# Patient Record
Sex: Male | Born: 1945 | Race: White | Hispanic: No | Marital: Married | Smoking: Former smoker
Health system: Southern US, Community
[De-identification: ages and names within clinical notes are randomized; demographics above are authoritative.]

## PROBLEM LIST (undated history)

## (undated) DIAGNOSIS — I499 Cardiac arrhythmia, unspecified: Secondary | ICD-10-CM

## (undated) DIAGNOSIS — T4145XA Adverse effect of unspecified anesthetic, initial encounter: Secondary | ICD-10-CM

## (undated) DIAGNOSIS — C189 Malignant neoplasm of colon, unspecified: Secondary | ICD-10-CM

## (undated) DIAGNOSIS — K430 Incisional hernia with obstruction, without gangrene: Secondary | ICD-10-CM

## (undated) DIAGNOSIS — I10 Essential (primary) hypertension: Secondary | ICD-10-CM

## (undated) DIAGNOSIS — T8859XA Other complications of anesthesia, initial encounter: Secondary | ICD-10-CM

## (undated) DIAGNOSIS — K469 Unspecified abdominal hernia without obstruction or gangrene: Secondary | ICD-10-CM

## (undated) DIAGNOSIS — J449 Chronic obstructive pulmonary disease, unspecified: Secondary | ICD-10-CM

## (undated) DIAGNOSIS — K449 Diaphragmatic hernia without obstruction or gangrene: Secondary | ICD-10-CM

## (undated) DIAGNOSIS — K219 Gastro-esophageal reflux disease without esophagitis: Secondary | ICD-10-CM

## (undated) HISTORY — DX: Unspecified abdominal hernia without obstruction or gangrene: K46.9

## (undated) HISTORY — DX: Chronic obstructive pulmonary disease, unspecified: J44.9

## (undated) HISTORY — DX: Essential (primary) hypertension: I10

## (undated) HISTORY — PX: HAND SURGERY: SHX662

---

## 1999-10-05 ENCOUNTER — Encounter: Payer: Self-pay | Admitting: Endocrinology

## 1999-10-05 ENCOUNTER — Inpatient Hospital Stay (HOSPITAL_COMMUNITY): Admission: EM | Admit: 1999-10-05 | Discharge: 1999-10-10 | Payer: Self-pay | Admitting: Emergency Medicine

## 1999-10-05 ENCOUNTER — Encounter: Payer: Self-pay | Admitting: Emergency Medicine

## 1999-10-06 ENCOUNTER — Encounter: Payer: Self-pay | Admitting: Internal Medicine

## 1999-10-07 ENCOUNTER — Encounter: Payer: Self-pay | Admitting: Internal Medicine

## 1999-10-08 ENCOUNTER — Encounter: Payer: Self-pay | Admitting: Internal Medicine

## 1999-10-09 ENCOUNTER — Encounter: Payer: Self-pay | Admitting: Internal Medicine

## 2001-12-05 ENCOUNTER — Emergency Department (HOSPITAL_COMMUNITY): Admission: EM | Admit: 2001-12-05 | Discharge: 2001-12-06 | Payer: Self-pay | Admitting: Emergency Medicine

## 2002-06-15 ENCOUNTER — Ambulatory Visit (HOSPITAL_COMMUNITY): Admission: RE | Admit: 2002-06-15 | Discharge: 2002-06-15 | Payer: Self-pay | Admitting: Internal Medicine

## 2003-05-30 ENCOUNTER — Emergency Department (HOSPITAL_COMMUNITY): Admission: EM | Admit: 2003-05-30 | Discharge: 2003-05-30 | Payer: Self-pay | Admitting: Emergency Medicine

## 2003-11-06 ENCOUNTER — Ambulatory Visit: Admission: RE | Admit: 2003-11-06 | Discharge: 2003-11-06 | Payer: Self-pay | Admitting: Internal Medicine

## 2004-05-15 ENCOUNTER — Emergency Department (HOSPITAL_COMMUNITY): Admission: EM | Admit: 2004-05-15 | Discharge: 2004-05-15 | Payer: Self-pay | Admitting: Emergency Medicine

## 2005-02-11 ENCOUNTER — Inpatient Hospital Stay (HOSPITAL_COMMUNITY): Admission: AD | Admit: 2005-02-11 | Discharge: 2005-02-14 | Payer: Self-pay | Admitting: Psychiatry

## 2005-02-12 ENCOUNTER — Ambulatory Visit: Payer: Self-pay | Admitting: Psychiatry

## 2010-03-06 ENCOUNTER — Encounter: Admission: RE | Admit: 2010-03-06 | Discharge: 2010-03-06 | Payer: Self-pay | Admitting: General Surgery

## 2010-03-06 DIAGNOSIS — I499 Cardiac arrhythmia, unspecified: Secondary | ICD-10-CM

## 2010-03-06 HISTORY — DX: Cardiac arrhythmia, unspecified: I49.9

## 2010-03-20 ENCOUNTER — Encounter (INDEPENDENT_AMBULATORY_CARE_PROVIDER_SITE_OTHER): Payer: Self-pay | Admitting: General Surgery

## 2010-03-20 ENCOUNTER — Inpatient Hospital Stay (HOSPITAL_COMMUNITY): Admission: RE | Admit: 2010-03-20 | Discharge: 2010-03-26 | Payer: Self-pay | Admitting: General Surgery

## 2010-03-26 ENCOUNTER — Ambulatory Visit: Payer: Self-pay | Admitting: Oncology

## 2010-03-28 ENCOUNTER — Inpatient Hospital Stay (HOSPITAL_COMMUNITY): Admission: EM | Admit: 2010-03-28 | Discharge: 2010-04-04 | Payer: Self-pay | Admitting: Emergency Medicine

## 2010-03-28 ENCOUNTER — Encounter: Admission: RE | Admit: 2010-03-28 | Discharge: 2010-03-28 | Payer: Self-pay | Admitting: General Surgery

## 2010-03-29 ENCOUNTER — Encounter (INDEPENDENT_AMBULATORY_CARE_PROVIDER_SITE_OTHER): Payer: Self-pay | Admitting: Emergency Medicine

## 2010-04-17 ENCOUNTER — Inpatient Hospital Stay (HOSPITAL_COMMUNITY): Admission: EM | Admit: 2010-04-17 | Discharge: 2010-04-19 | Payer: Self-pay | Admitting: Emergency Medicine

## 2010-04-22 ENCOUNTER — Ambulatory Visit (HOSPITAL_COMMUNITY): Admission: RE | Admit: 2010-04-22 | Discharge: 2010-04-22 | Payer: Self-pay | Admitting: General Surgery

## 2010-05-06 ENCOUNTER — Ambulatory Visit: Payer: Self-pay | Admitting: Oncology

## 2010-05-22 LAB — COMPREHENSIVE METABOLIC PANEL
Albumin: 3 g/dL — ABNORMAL LOW (ref 3.5–5.2)
BUN: 19 mg/dL (ref 6–23)
Creatinine, Ser: 0.8 mg/dL (ref 0.40–1.50)
Glucose, Bld: 130 mg/dL — ABNORMAL HIGH (ref 70–99)
Total Bilirubin: 0.5 mg/dL (ref 0.3–1.2)
Total Protein: 6 g/dL (ref 6.0–8.3)

## 2010-05-22 LAB — CBC WITH DIFFERENTIAL/PLATELET
Eosinophils Absolute: 0.1 10*3/uL (ref 0.0–0.5)
HCT: 38.7 % (ref 38.4–49.9)
MCH: 29 pg (ref 27.2–33.4)
MCV: 87 fL (ref 79.3–98.0)
MONO#: 0.8 10*3/uL (ref 0.1–0.9)
MONO%: 12.8 % (ref 0.0–14.0)
NEUT#: 3.4 10*3/uL (ref 1.5–6.5)
RBC: 4.45 10*6/uL (ref 4.20–5.82)
RDW: 14.9 % — ABNORMAL HIGH (ref 11.0–14.6)
lymph#: 1.9 10*3/uL (ref 0.9–3.3)

## 2010-06-05 ENCOUNTER — Ambulatory Visit: Payer: Self-pay | Admitting: Oncology

## 2010-06-05 LAB — COMPREHENSIVE METABOLIC PANEL
ALT: 35 U/L (ref 0–53)
AST: 30 U/L (ref 0–37)
Chloride: 109 mEq/L (ref 96–112)
Creatinine, Ser: 0.77 mg/dL (ref 0.40–1.50)
Glucose, Bld: 164 mg/dL — ABNORMAL HIGH (ref 70–99)

## 2010-06-05 LAB — CBC WITH DIFFERENTIAL/PLATELET
Basophils Absolute: 0 10*3/uL (ref 0.0–0.1)
HCT: 37.8 % — ABNORMAL LOW (ref 38.4–49.9)
MCH: 28.8 pg (ref 27.2–33.4)
MCV: 87.9 fL (ref 79.3–98.0)
MONO#: 0.5 10*3/uL (ref 0.1–0.9)
NEUT#: 3.7 10*3/uL (ref 1.5–6.5)
Platelets: 177 10*3/uL (ref 140–400)
RBC: 4.3 10*6/uL (ref 4.20–5.82)
RDW: 17 % — ABNORMAL HIGH (ref 11.0–14.6)
WBC: 6 10*3/uL (ref 4.0–10.3)
lymph#: 1.7 10*3/uL (ref 0.9–3.3)
nRBC: 0 % (ref 0–0)

## 2010-07-02 LAB — CBC WITH DIFFERENTIAL/PLATELET
BASO%: 0.8 % (ref 0.0–2.0)
EOS%: 0.9 % (ref 0.0–7.0)
HCT: 40.7 % (ref 38.4–49.9)
HGB: 13.3 g/dL (ref 13.0–17.1)
MCHC: 32.7 g/dL (ref 32.0–36.0)
MONO%: 11.3 % (ref 0.0–14.0)
NEUT#: 5.5 10*3/uL (ref 1.5–6.5)
Platelets: 167 10*3/uL (ref 140–400)
RDW: 20.3 % — ABNORMAL HIGH (ref 11.0–14.6)
lymph#: 2 10*3/uL (ref 0.9–3.3)

## 2010-07-02 LAB — COMPREHENSIVE METABOLIC PANEL
BUN: 16 mg/dL (ref 6–23)
CO2: 30 mEq/L (ref 19–32)
Chloride: 111 mEq/L (ref 96–112)
Creatinine, Ser: 0.7 mg/dL (ref 0.40–1.50)
Potassium: 3.4 mEq/L — ABNORMAL LOW (ref 3.5–5.3)
Sodium: 142 mEq/L (ref 135–145)
Total Bilirubin: 0.8 mg/dL (ref 0.3–1.2)

## 2010-07-02 LAB — CEA: CEA: 2.6 ng/mL (ref 0.0–5.0)

## 2010-07-10 ENCOUNTER — Ambulatory Visit: Payer: Self-pay | Admitting: Oncology

## 2010-07-30 LAB — COMPREHENSIVE METABOLIC PANEL
ALT: 25 U/L (ref 0–53)
Alkaline Phosphatase: 78 U/L (ref 39–117)
CO2: 30 mEq/L (ref 19–32)
Calcium: 9.3 mg/dL (ref 8.4–10.5)
Chloride: 104 mEq/L (ref 96–112)
Potassium: 3.2 mEq/L — ABNORMAL LOW (ref 3.5–5.3)

## 2010-07-30 LAB — CBC WITH DIFFERENTIAL/PLATELET
EOS%: 1.1 % (ref 0.0–7.0)
HCT: 42.7 % (ref 38.4–49.9)
HGB: 14.4 g/dL (ref 13.0–17.1)
LYMPH%: 17.2 % (ref 14.0–49.0)
MCH: 32 pg (ref 27.2–33.4)
MCHC: 33.8 g/dL (ref 32.0–36.0)
NEUT#: 7.6 10*3/uL — ABNORMAL HIGH (ref 1.5–6.5)
Platelets: 227 10*3/uL (ref 140–400)
RDW: 20.7 % — ABNORMAL HIGH (ref 11.0–14.6)
lymph#: 1.8 10*3/uL (ref 0.9–3.3)

## 2010-08-14 ENCOUNTER — Other Ambulatory Visit: Payer: Self-pay | Admitting: Oncology

## 2010-08-14 LAB — CBC WITH DIFFERENTIAL/PLATELET
BASO%: 0.3 % (ref 0.0–2.0)
EOS%: 1.1 % (ref 0.0–7.0)
HCT: 38.2 % — ABNORMAL LOW (ref 38.4–49.9)
LYMPH%: 23.1 % (ref 14.0–49.0)
MONO%: 12.1 % (ref 0.0–14.0)
NEUT%: 63.4 % (ref 39.0–75.0)
RBC: 3.97 10*6/uL — ABNORMAL LOW (ref 4.20–5.82)
WBC: 4.5 10*3/uL (ref 4.0–10.3)

## 2010-08-14 LAB — COMPREHENSIVE METABOLIC PANEL
ALT: 27 U/L (ref 0–53)
BUN: 18 mg/dL (ref 6–23)
CO2: 29 mEq/L (ref 19–32)
Calcium: 9.2 mg/dL (ref 8.4–10.5)
Chloride: 104 mEq/L (ref 96–112)
Creatinine, Ser: 0.92 mg/dL (ref 0.40–1.50)
Glucose, Bld: 174 mg/dL — ABNORMAL HIGH (ref 70–99)

## 2010-09-03 ENCOUNTER — Other Ambulatory Visit: Payer: Self-pay | Admitting: Oncology

## 2010-09-03 LAB — CBC WITH DIFFERENTIAL/PLATELET
BASO%: 1.3 % (ref 0.0–2.0)
EOS%: 1.8 % (ref 0.0–7.0)
LYMPH%: 31.2 % (ref 14.0–49.0)
MCHC: 34 g/dL (ref 32.0–36.0)
MCV: 95.6 fL (ref 79.3–98.0)
MONO#: 0.7 10*3/uL (ref 0.1–0.9)
MONO%: 15.1 % — ABNORMAL HIGH (ref 0.0–14.0)
Platelets: 208 10*3/uL (ref 140–400)
RBC: 4.52 10*6/uL (ref 4.20–5.82)
WBC: 4.5 10*3/uL (ref 4.0–10.3)
nRBC: 0 % (ref 0–0)

## 2010-09-03 LAB — COMPREHENSIVE METABOLIC PANEL
ALT: 32 U/L (ref 0–53)
CO2: 27 mEq/L (ref 19–32)
Sodium: 140 mEq/L (ref 135–145)
Total Bilirubin: 0.6 mg/dL (ref 0.3–1.2)
Total Protein: 6.5 g/dL (ref 6.0–8.3)

## 2010-09-13 ENCOUNTER — Ambulatory Visit: Payer: Self-pay | Admitting: Oncology

## 2010-09-23 ENCOUNTER — Ambulatory Visit (HOSPITAL_COMMUNITY): Admission: RE | Admit: 2010-09-23 | Payer: Self-pay | Source: Home / Self Care | Admitting: General Surgery

## 2010-10-25 ENCOUNTER — Ambulatory Visit: Payer: Self-pay | Admitting: Oncology

## 2010-10-29 LAB — CBC WITH DIFFERENTIAL/PLATELET
Basophils Absolute: 0 10*3/uL (ref 0.0–0.1)
HCT: 43.6 % (ref 38.4–49.9)
HGB: 14.9 g/dL (ref 13.0–17.1)
MCH: 32.9 pg (ref 27.2–33.4)
MCV: 96.3 fL (ref 79.3–98.0)
MONO#: 0.7 10*3/uL (ref 0.1–0.9)
NEUT#: 6.2 10*3/uL (ref 1.5–6.5)
NEUT%: 73 % (ref 39.0–75.0)
Platelets: 205 10*3/uL (ref 140–400)
RBC: 4.52 10*6/uL (ref 4.20–5.82)
RDW: 14.4 % (ref 11.0–14.6)
WBC: 8.5 10*3/uL (ref 4.0–10.3)

## 2010-10-29 LAB — COMPREHENSIVE METABOLIC PANEL
ALT: 23 U/L (ref 0–53)
Albumin: 4.2 g/dL (ref 3.5–5.2)
Calcium: 9.1 mg/dL (ref 8.4–10.5)
Creatinine, Ser: 0.9 mg/dL (ref 0.40–1.50)
Sodium: 140 mEq/L (ref 135–145)

## 2010-11-14 ENCOUNTER — Other Ambulatory Visit: Payer: Self-pay | Admitting: Oncology

## 2010-11-14 ENCOUNTER — Encounter (HOSPITAL_BASED_OUTPATIENT_CLINIC_OR_DEPARTMENT_OTHER): Payer: MEDICARE | Admitting: Oncology

## 2010-11-14 DIAGNOSIS — J4489 Other specified chronic obstructive pulmonary disease: Secondary | ICD-10-CM

## 2010-11-14 DIAGNOSIS — J449 Chronic obstructive pulmonary disease, unspecified: Secondary | ICD-10-CM

## 2010-11-14 DIAGNOSIS — Z5111 Encounter for antineoplastic chemotherapy: Secondary | ICD-10-CM

## 2010-11-14 DIAGNOSIS — G620 Drug-induced polyneuropathy: Secondary | ICD-10-CM

## 2010-11-14 DIAGNOSIS — C180 Malignant neoplasm of cecum: Secondary | ICD-10-CM

## 2010-11-14 LAB — COMPREHENSIVE METABOLIC PANEL
ALT: 24 U/L (ref 0–53)
Albumin: 4.3 g/dL (ref 3.5–5.2)
BUN: 19 mg/dL (ref 6–23)
CO2: 24 mEq/L (ref 19–32)
Calcium: 9.2 mg/dL (ref 8.4–10.5)
Creatinine, Ser: 0.76 mg/dL (ref 0.40–1.50)
Potassium: 3.5 mEq/L (ref 3.5–5.3)
Sodium: 144 mEq/L (ref 135–145)

## 2010-11-14 LAB — CBC WITH DIFFERENTIAL/PLATELET
MCHC: 35.5 g/dL (ref 32.0–36.0)
MONO#: 0.6 10*3/uL (ref 0.1–0.9)
MONO%: 8.4 % (ref 0.0–14.0)
NEUT#: 4.5 10*3/uL (ref 1.5–6.5)
NEUT%: 65.6 % (ref 39.0–75.0)
Platelets: 168 10*3/uL (ref 140–400)
RBC: 4.41 10*6/uL (ref 4.20–5.82)

## 2010-11-16 ENCOUNTER — Encounter (HOSPITAL_BASED_OUTPATIENT_CLINIC_OR_DEPARTMENT_OTHER): Payer: MEDICARE | Admitting: Oncology

## 2010-11-16 DIAGNOSIS — C180 Malignant neoplasm of cecum: Secondary | ICD-10-CM

## 2010-11-28 ENCOUNTER — Other Ambulatory Visit: Payer: Self-pay | Admitting: Oncology

## 2010-11-28 ENCOUNTER — Encounter (HOSPITAL_BASED_OUTPATIENT_CLINIC_OR_DEPARTMENT_OTHER): Payer: MEDICARE | Admitting: Oncology

## 2010-11-28 DIAGNOSIS — C180 Malignant neoplasm of cecum: Secondary | ICD-10-CM

## 2010-11-28 DIAGNOSIS — I10 Essential (primary) hypertension: Secondary | ICD-10-CM

## 2010-11-28 DIAGNOSIS — G620 Drug-induced polyneuropathy: Secondary | ICD-10-CM

## 2010-11-28 DIAGNOSIS — Z5111 Encounter for antineoplastic chemotherapy: Secondary | ICD-10-CM

## 2010-11-28 LAB — CBC WITH DIFFERENTIAL/PLATELET
Basophils Absolute: 0 10*3/uL (ref 0.0–0.1)
HCT: 43.8 % (ref 38.4–49.9)
HGB: 15.1 g/dL (ref 13.0–17.1)
LYMPH%: 19.7 % (ref 14.0–49.0)
MCH: 33.3 pg (ref 27.2–33.4)
MCHC: 34.6 g/dL (ref 32.0–36.0)
NEUT%: 69.2 % (ref 39.0–75.0)
Platelets: 195 10*3/uL (ref 140–400)
RBC: 4.54 10*6/uL (ref 4.20–5.82)
WBC: 7.7 10*3/uL (ref 4.0–10.3)
lymph#: 1.5 10*3/uL (ref 0.9–3.3)

## 2010-11-28 LAB — COMPREHENSIVE METABOLIC PANEL
AST: 27 U/L (ref 0–37)
Alkaline Phosphatase: 77 U/L (ref 39–117)
BUN: 21 mg/dL (ref 6–23)
Creatinine, Ser: 1.05 mg/dL (ref 0.40–1.50)
Glucose, Bld: 142 mg/dL — ABNORMAL HIGH (ref 70–99)
Potassium: 3.5 mEq/L (ref 3.5–5.3)
Sodium: 137 mEq/L (ref 135–145)
Total Bilirubin: 1 mg/dL (ref 0.3–1.2)

## 2010-11-30 ENCOUNTER — Encounter (HOSPITAL_BASED_OUTPATIENT_CLINIC_OR_DEPARTMENT_OTHER): Payer: MEDICARE | Admitting: Oncology

## 2010-11-30 DIAGNOSIS — C180 Malignant neoplasm of cecum: Secondary | ICD-10-CM

## 2010-12-21 LAB — CBC
Hemoglobin: 12.8 g/dL — ABNORMAL LOW (ref 13.0–17.0)
MCH: 30.2 pg (ref 26.0–34.0)
MCV: 88.9 fL (ref 78.0–100.0)
RBC: 4.23 MIL/uL (ref 4.22–5.81)

## 2010-12-21 LAB — BASIC METABOLIC PANEL
CO2: 28 mEq/L (ref 19–32)
Chloride: 105 mEq/L (ref 96–112)
GFR calc Af Amer: >60 mL/min (ref >60)
Sodium: 141 mEq/L (ref 135–145)

## 2010-12-21 LAB — APTT: aPTT: 27 seconds (ref 24–37)

## 2010-12-21 LAB — PROTIME-INR: INR: 1.05 (ref 0.00–1.49)

## 2010-12-22 LAB — URINE CULTURE: Colony Count: 2000

## 2010-12-22 LAB — DIFFERENTIAL
Basophils Relative: 0 % (ref 0–1)
Eosinophils Absolute: 0.1 10*3/uL (ref 0.0–0.7)
Lymphs Abs: 1.2 10*3/uL (ref 0.7–4.0)
Monocytes Absolute: 0.6 10*3/uL (ref 0.1–1.0)
Monocytes Relative: 7 % (ref 3–12)

## 2010-12-22 LAB — TYPE AND SCREEN
ABO/RH(D): O POS
Antibody Screen: NEGATIVE

## 2010-12-22 LAB — CBC
HCT: 39.7 % (ref 39.0–52.0)
MCH: 29.8 pg (ref 26.0–34.0)
MCHC: 33.2 g/dL (ref 30.0–36.0)
MCHC: 33.3 g/dL (ref 30.0–36.0)
MCV: 89.6 fL (ref 78.0–100.0)
Platelets: 276 10*3/uL (ref 150–400)
Platelets: 325 10*3/uL (ref 150–400)
RDW: 13.4 % (ref 11.5–15.5)
RDW: 13.8 % (ref 11.5–15.5)
WBC: 9.4 10*3/uL (ref 4.0–10.5)

## 2010-12-22 LAB — BASIC METABOLIC PANEL
BUN: 11 mg/dL (ref 6–23)
CO2: 27 mEq/L (ref 19–32)
Calcium: 8.8 mg/dL (ref 8.4–10.5)
Chloride: 105 mEq/L (ref 96–112)
Creatinine, Ser: 0.86 mg/dL (ref 0.4–1.5)

## 2010-12-22 LAB — ABO/RH: ABO/RH(D): O POS

## 2010-12-22 LAB — URINALYSIS, ROUTINE W REFLEX MICROSCOPIC
Glucose, UA: NEGATIVE mg/dL
Hgb urine dipstick: NEGATIVE
Ketones, ur: 15 mg/dL — AB
Leukocytes, UA: NEGATIVE
Protein, ur: 100 mg/dL — AB
pH: 8 (ref 5.0–8.0)

## 2010-12-22 LAB — COMPREHENSIVE METABOLIC PANEL
ALT: 18 U/L (ref 0–53)
AST: 14 U/L (ref 0–37)
Albumin: 3.4 g/dL — ABNORMAL LOW (ref 3.5–5.2)
Alkaline Phosphatase: 90 U/L (ref 39–117)
Calcium: 9.8 mg/dL (ref 8.4–10.5)
GFR calc Af Amer: >60 mL/min (ref >60)
Glucose, Bld: 137 mg/dL — ABNORMAL HIGH (ref 70–99)
Potassium: 3.9 mEq/L (ref 3.5–5.1)
Sodium: 141 mEq/L (ref 135–145)
Total Protein: 6.9 g/dL (ref 6.0–8.3)

## 2010-12-22 LAB — CLOSTRIDIUM DIFFICILE EIA

## 2010-12-22 LAB — URINE MICROSCOPIC-ADD ON

## 2010-12-22 LAB — MAGNESIUM: Magnesium: 2 mg/dL (ref 1.5–2.5)

## 2010-12-23 LAB — CBC
HCT: 32.3 % — ABNORMAL LOW (ref 39.0–52.0)
HCT: 32.8 % — ABNORMAL LOW (ref 39.0–52.0)
HCT: 32.9 % — ABNORMAL LOW (ref 39.0–52.0)
HCT: 33.8 % — ABNORMAL LOW (ref 39.0–52.0)
HCT: 35.3 % — ABNORMAL LOW (ref 39.0–52.0)
HCT: 46.2 % (ref 39.0–52.0)
Hemoglobin: 11.1 g/dL — ABNORMAL LOW (ref 13.0–17.0)
Hemoglobin: 11.2 g/dL — ABNORMAL LOW (ref 13.0–17.0)
Hemoglobin: 11.3 g/dL — ABNORMAL LOW (ref 13.0–17.0)
Hemoglobin: 11.6 g/dL — ABNORMAL LOW (ref 13.0–17.0)
Hemoglobin: 11.8 g/dL — ABNORMAL LOW (ref 13.0–17.0)
Hemoglobin: 11.9 g/dL — ABNORMAL LOW (ref 13.0–17.0)
Hemoglobin: 11.9 g/dL — ABNORMAL LOW (ref 13.0–17.0)
Hemoglobin: 12.2 g/dL — ABNORMAL LOW (ref 13.0–17.0)
Hemoglobin: 15.5 g/dL (ref 13.0–17.0)
MCH: 30.7 pg (ref 26.0–34.0)
MCH: 31.2 pg (ref 26.0–34.0)
MCHC: 33.5 g/dL (ref 30.0–36.0)
MCHC: 33.5 g/dL (ref 30.0–36.0)
MCHC: 34 g/dL (ref 30.0–36.0)
MCHC: 34 g/dL (ref 30.0–36.0)
MCHC: 34.3 g/dL (ref 30.0–36.0)
MCV: 90.4 fL (ref 78.0–100.0)
MCV: 90.6 fL (ref 78.0–100.0)
MCV: 91.6 fL (ref 78.0–100.0)
MCV: 92.1 fL (ref 78.0–100.0)
MCV: 92.1 fL (ref 78.0–100.0)
Platelets: 218 10*3/uL (ref 150–400)
Platelets: 275 10*3/uL (ref 150–400)
Platelets: 315 10*3/uL (ref 150–400)
Platelets: 321 10*3/uL (ref 150–400)
RBC: 3.66 MIL/uL — ABNORMAL LOW (ref 4.22–5.81)
RBC: 3.74 MIL/uL — ABNORMAL LOW (ref 4.22–5.81)
RBC: 3.9 MIL/uL — ABNORMAL LOW (ref 4.22–5.81)
RDW: 13.1 % (ref 11.5–15.5)
RDW: 13.2 % (ref 11.5–15.5)
RDW: 13.5 % (ref 11.5–15.5)
RDW: 13.5 % (ref 11.5–15.5)
RDW: 13.6 % (ref 11.5–15.5)
RDW: 13.8 % (ref 11.5–15.5)
WBC: 12.9 10*3/uL — ABNORMAL HIGH (ref 4.0–10.5)
WBC: 13 10*3/uL — ABNORMAL HIGH (ref 4.0–10.5)
WBC: 16.5 10*3/uL — ABNORMAL HIGH (ref 4.0–10.5)

## 2010-12-23 LAB — BASIC METABOLIC PANEL
BUN: 17 mg/dL (ref 6–23)
BUN: 6 mg/dL (ref 6–23)
CO2: 25 mEq/L (ref 19–32)
CO2: 27 mEq/L (ref 19–32)
CO2: 27 mEq/L (ref 19–32)
Calcium: 8.2 mg/dL — ABNORMAL LOW (ref 8.4–10.5)
Calcium: 8.4 mg/dL (ref 8.4–10.5)
Calcium: 8.5 mg/dL (ref 8.4–10.5)
Calcium: 9.3 mg/dL (ref 8.4–10.5)
Chloride: 102 mEq/L (ref 96–112)
Chloride: 107 mEq/L (ref 96–112)
Chloride: 107 mEq/L (ref 96–112)
Creatinine, Ser: 0.7 mg/dL (ref 0.4–1.5)
Creatinine, Ser: 0.78 mg/dL (ref 0.4–1.5)
GFR calc Af Amer: >60 mL/min (ref >60)
GFR calc Af Amer: >60 mL/min (ref >60)
GFR calc Af Amer: >60 mL/min (ref >60)
GFR calc non Af Amer: >60 mL/min (ref >60)
GFR calc non Af Amer: >60 mL/min (ref >60)
GFR calc non Af Amer: >60 mL/min (ref >60)
GFR calc non Af Amer: >60 mL/min (ref >60)
GFR calc non Af Amer: >60 mL/min (ref >60)
Glucose, Bld: 116 mg/dL — ABNORMAL HIGH (ref 70–99)
Glucose, Bld: 122 mg/dL — ABNORMAL HIGH (ref 70–99)
Glucose, Bld: 151 mg/dL — ABNORMAL HIGH (ref 70–99)
Potassium: 3.6 mEq/L (ref 3.5–5.1)
Potassium: 3.6 mEq/L (ref 3.5–5.1)
Potassium: 3.8 mEq/L (ref 3.5–5.1)
Potassium: 4.2 mEq/L (ref 3.5–5.1)
Sodium: 136 mEq/L (ref 135–145)
Sodium: 137 mEq/L (ref 135–145)
Sodium: 137 mEq/L (ref 135–145)
Sodium: 139 mEq/L (ref 135–145)
Sodium: 141 mEq/L (ref 135–145)

## 2010-12-23 LAB — DIFFERENTIAL
Basophils Absolute: 0 10*3/uL (ref 0.0–0.1)
Basophils Absolute: 0.1 10*3/uL (ref 0.0–0.1)
Basophils Relative: 1 % (ref 0–1)
Eosinophils Absolute: 0.3 10*3/uL (ref 0.0–0.7)
Eosinophils Relative: 0 % (ref 0–5)
Eosinophils Relative: 3 % (ref 0–5)
Eosinophils Relative: 3 % (ref 0–5)
Lymphocytes Relative: 14 % (ref 12–46)
Lymphocytes Relative: 7 % — ABNORMAL LOW (ref 12–46)
Lymphs Abs: 1.4 10*3/uL (ref 0.7–4.0)
Monocytes Absolute: 0.7 10*3/uL (ref 0.1–1.0)
Monocytes Absolute: 0.8 10*3/uL (ref 0.1–1.0)
Monocytes Relative: 5 % (ref 3–12)
Monocytes Relative: 6 % (ref 3–12)
Monocytes Relative: 7 % (ref 3–12)
Neutro Abs: 11.3 10*3/uL — ABNORMAL HIGH (ref 1.7–7.7)
Neutro Abs: 8.8 10*3/uL — ABNORMAL HIGH (ref 1.7–7.7)

## 2010-12-23 LAB — HEMOGLOBIN AND HEMATOCRIT, BLOOD
HCT: 38.1 % — ABNORMAL LOW (ref 39.0–52.0)
Hemoglobin: 13.3 g/dL (ref 13.0–17.0)

## 2010-12-23 LAB — CLOSTRIDIUM DIFFICILE EIA: C difficile Toxins A+B, EIA: NEGATIVE

## 2010-12-23 LAB — URINALYSIS, ROUTINE W REFLEX MICROSCOPIC
Glucose, UA: NEGATIVE mg/dL
Hgb urine dipstick: NEGATIVE
Ketones, ur: >80 mg/dL — AB
Protein, ur: 30 mg/dL — AB
pH: 6.5 (ref 5.0–8.0)

## 2010-12-23 LAB — HEPATIC FUNCTION PANEL
ALT: 42 U/L (ref 0–53)
AST: 33 U/L (ref 0–37)
Alkaline Phosphatase: 61 U/L (ref 39–117)
Bilirubin, Direct: <0.1 mg/dL (ref 0.0–0.3)
Total Bilirubin: 0.8 mg/dL (ref 0.3–1.2)

## 2010-12-23 LAB — POCT I-STAT, CHEM 8
BUN: 17 mg/dL (ref 6–23)
Calcium, Ion: 1.16 mmol/L (ref 1.12–1.32)
Creatinine, Ser: 0.7 mg/dL (ref 0.4–1.5)
Glucose, Bld: 142 mg/dL — ABNORMAL HIGH (ref 70–99)
Hemoglobin: 15.6 g/dL (ref 13.0–17.0)
Sodium: 135 mEq/L (ref 135–145)
TCO2: 29 mmol/L (ref 0–100)

## 2010-12-23 LAB — COMPREHENSIVE METABOLIC PANEL
ALT: 74 U/L — ABNORMAL HIGH (ref 0–53)
AST: 23 U/L (ref 0–37)
AST: 28 U/L (ref 0–37)
AST: 29 U/L (ref 0–37)
AST: 35 U/L (ref 0–37)
Albumin: 2.4 g/dL — ABNORMAL LOW (ref 3.5–5.2)
Alkaline Phosphatase: 77 U/L (ref 39–117)
BUN: 5 mg/dL — ABNORMAL LOW (ref 6–23)
CO2: 21 mEq/L (ref 19–32)
CO2: 25 mEq/L (ref 19–32)
CO2: 28 mEq/L (ref 19–32)
Calcium: 8.8 mg/dL (ref 8.4–10.5)
Calcium: 9.1 mg/dL (ref 8.4–10.5)
Chloride: 104 mEq/L (ref 96–112)
Chloride: 106 mEq/L (ref 96–112)
Creatinine, Ser: 0.72 mg/dL (ref 0.4–1.5)
Creatinine, Ser: 0.82 mg/dL (ref 0.4–1.5)
Creatinine, Ser: 0.99 mg/dL (ref 0.4–1.5)
GFR calc Af Amer: >60 mL/min (ref >60)
GFR calc Af Amer: >60 mL/min (ref >60)
GFR calc Af Amer: >60 mL/min (ref >60)
GFR calc non Af Amer: >60 mL/min (ref >60)
GFR calc non Af Amer: >60 mL/min (ref >60)
GFR calc non Af Amer: >60 mL/min (ref >60)
Glucose, Bld: 114 mg/dL — ABNORMAL HIGH (ref 70–99)
Glucose, Bld: 150 mg/dL — ABNORMAL HIGH (ref 70–99)
Potassium: 3.3 mEq/L — ABNORMAL LOW (ref 3.5–5.1)
Sodium: 133 mEq/L — ABNORMAL LOW (ref 135–145)
Total Bilirubin: 0.2 mg/dL — ABNORMAL LOW (ref 0.3–1.2)
Total Bilirubin: 0.5 mg/dL (ref 0.3–1.2)
Total Protein: 4.9 g/dL — ABNORMAL LOW (ref 6.0–8.3)
Total Protein: 5.3 g/dL — ABNORMAL LOW (ref 6.0–8.3)
Total Protein: 6.9 g/dL (ref 6.0–8.3)

## 2010-12-23 LAB — URINE CULTURE: Colony Count: >=100000

## 2010-12-23 LAB — PREALBUMIN: Prealbumin: 9.7 mg/dL — ABNORMAL LOW (ref 18.0–45.0)

## 2010-12-23 LAB — URINE MICROSCOPIC-ADD ON

## 2010-12-23 LAB — PHOSPHORUS
Phosphorus: 2.8 mg/dL (ref 2.3–4.6)
Phosphorus: 2.8 mg/dL (ref 2.3–4.6)
Phosphorus: 3.3 mg/dL (ref 2.3–4.6)

## 2010-12-23 LAB — GLUCOSE, CAPILLARY
Glucose-Capillary: 130 mg/dL — ABNORMAL HIGH (ref 70–99)
Glucose-Capillary: 137 mg/dL — ABNORMAL HIGH (ref 70–99)
Glucose-Capillary: 164 mg/dL — ABNORMAL HIGH (ref 70–99)
Glucose-Capillary: 172 mg/dL — ABNORMAL HIGH (ref 70–99)

## 2010-12-23 LAB — MAGNESIUM
Magnesium: 1.8 mg/dL (ref 1.5–2.5)
Magnesium: 2 mg/dL (ref 1.5–2.5)
Magnesium: 2.3 mg/dL (ref 1.5–2.5)

## 2010-12-23 LAB — HEPARIN LEVEL (UNFRACTIONATED)
Heparin Unfractionated: 0.1 IU/mL — ABNORMAL LOW (ref 0.30–0.70)
Heparin Unfractionated: 0.33 IU/mL (ref 0.30–0.70)
Heparin Unfractionated: 0.35 IU/mL (ref 0.30–0.70)
Heparin Unfractionated: 0.35 IU/mL (ref 0.30–0.70)
Heparin Unfractionated: <0.1 IU/mL — ABNORMAL LOW (ref 0.30–0.70)

## 2010-12-23 LAB — TRIGLYCERIDES: Triglycerides: 88 mg/dL (ref <150)

## 2011-01-07 ENCOUNTER — Encounter (HOSPITAL_BASED_OUTPATIENT_CLINIC_OR_DEPARTMENT_OTHER): Payer: MEDICARE | Admitting: Oncology

## 2011-01-07 ENCOUNTER — Other Ambulatory Visit: Payer: Self-pay | Admitting: Oncology

## 2011-01-07 DIAGNOSIS — C189 Malignant neoplasm of colon, unspecified: Secondary | ICD-10-CM

## 2011-01-07 DIAGNOSIS — J449 Chronic obstructive pulmonary disease, unspecified: Secondary | ICD-10-CM

## 2011-01-07 DIAGNOSIS — C180 Malignant neoplasm of cecum: Secondary | ICD-10-CM

## 2011-01-07 DIAGNOSIS — Z5111 Encounter for antineoplastic chemotherapy: Secondary | ICD-10-CM

## 2011-02-07 ENCOUNTER — Encounter (INDEPENDENT_AMBULATORY_CARE_PROVIDER_SITE_OTHER): Payer: Self-pay | Admitting: General Surgery

## 2011-02-18 ENCOUNTER — Encounter (HOSPITAL_BASED_OUTPATIENT_CLINIC_OR_DEPARTMENT_OTHER): Payer: MEDICARE | Admitting: Oncology

## 2011-02-18 DIAGNOSIS — C180 Malignant neoplasm of cecum: Secondary | ICD-10-CM

## 2011-02-18 DIAGNOSIS — Z452 Encounter for adjustment and management of vascular access device: Secondary | ICD-10-CM

## 2011-02-21 NOTE — Discharge Summary (Signed)
NAMEDAEGEN, BERROCAL                 ACCOUNT NO.:  [id number]   MEDICAL RECORD NO.:  [id number]          PATIENT TYPE:  IPS   LOCATION:  0302                          FACILITY:  [facility]   PHYSICIAN:  Jeanice Lim, M.D. DATE OF BIRTH:  1946-03-31   DATE OF ADMISSION:  02/11/2005  DATE OF DISCHARGE:  02/14/2005                                 DISCHARGE SUMMARY   IDENTIFYING DATA:  This is a 65 year old Caucasian male, married,  involuntarily admitted.  Reporting I was only trying to sleep.  The  patient petitioned by primary care physician, taking extra Phenergan tablets  in order to sleep.  Reporting he only took four.  The patient's wife  contended to primary care physician taking excessive amount of Phenergan  along with Valium.  The patient would deny it again that he was trying to  kill himself but he admitted that he was unable to sleep for several days,  constant ruminating, worries, and did not know what he was going to do to  get over the mountain of bills at his house.  Wife, reportedly as per  patient, had a problem over-spending, possible impulse control or learning  disability and steals checks and bank cards from running up charges that he  cannot control.  Loves her and wants to remain in the marriage but worried  about their financial situation.  He was taken out of work in December of  2005 by pulmonologist due to long history of asthma and COPD, aggravated by  occupational environment.  Endorsed depression for approximately six months  and endorsed sleeping too much.  Endorsed not sleeping more than two hours a  night with helplessness and hopelessness and decreased concentration and  suicidal thoughts.  Wife apparently was pressuring already for patient to be  discharged due to their financial situation despite patient's report that  wife's behavior was part of the reason that there was financial strain.   PAST PSYCHIATRIC HISTORY:  This is patient's first psychiatric  inpatient  admission to [facility].  Recently treated by primary care  physician.  Had started on Lexapro in January of 2006.  No side effects but  he did not take it on a regular basis, being noncompliant.  Physician  discontinued Lexapro and started him on Cymbalta in past month, which he  reports gave him a headache and GI upset.  Had also not been taking this on  a regular basis.  Clearly noncompliant.  Needing education regarding the  importance of being 100% compliant with antidepressant treatment.   ALCOHOL/DRUG HISTORY:  The patient denied.   MEDICAL PROBLEMS:  Systemic arterial hypertension, reflux-induced  respiratory disease, obstructive lung disease with possible occupational  asthma.   MEDICATIONS:  Prevacid SoluTab, Cymbalta.  Noncompliant and previous  noncompliant with Lexapro for inadequate trials.  Foradil 1 puff daily,  Spiriva 1 puff daily, Nasonex  1 spray each nostril once a day, Micardis 80  mg daily, Sular 10 mg daily and Asmanex 220 mcg 1 spray daily.   ALLERGIES:  Possibly CYMBALTA due to patient's report of headaches  and  nausea, although off and on use can increase side effects.  PENICILLIN,  which patient reported had a seizure in childhood.   PHYSICAL EXAMINATION:  Essentially within normal limits.  O2 saturation  running from 97% to 99%.  Neurologically nonfocal.   LABORATORY DATA:  Routine admission labs essentially within normal limits  including liver enzymes and TSH.   MENTAL STATUS EXAM:  Fully alert male.  Disheveled, unshaven.  Poor eye  contact.  Psychomotor slowing.  Affect flattened.  Mild guarding.  Speech  slowed, decreased short responses, somewhat rambling at times.  Mood  depressed, frustrated, slightly guarded.  Cognition intact.  Impulse control  and judgment within normal limits, although insight regarding what to do and  severity of his symptoms as well as ways to solve or stop the creation of  further stress he did  not have insight into and did not clearly feel safe  while able to contract for safety in the hospital.   ADMISSION DIAGNOSES:   AXIS I:  Major depressive disorder, initial episode, severe.   AXIS II:  Deferred.   AXIS III:  1.  Obstructive lung disease.  2.  Possible occupational asthma.  3.  Reflux-induced respiratory disease.  4.  Systemic arterial hypertension.   AXIS IV:  Moderate to severe (stressors related to uncontrollable issues  with financial stress generated by spouse as per patient as well as other  medical problems and health issues).   AXIS V:  35/60.   HOSPITAL COURSE:  The patient was admitted and ordered routine p.r.n.  medications and underwent further monitoring.  Was encouraged to participate  in individual, group and milieu therapy.  Weapons were reviewed and, if  present, secured.  The patient was willing to start back on medications and  was educated daily regarding the importance of compliance with medications  and wife was pushing patient to get out daily.  Husband would report his  tremendous love for her although feeling not safe, feeling suicidal and  there was a clear dependency dynamic that patient was advised to work on in  couples' therapy in addition to individual therapy to be able to set limits  and not magnify her weaknesses and then increase his stress, which he  clearly is unable to tolerate.  The patient was able to do some problem-  solving.  Aftercare planning was developed. The patient showed tolerance and  response to medications, had resolution of suicidal thoughts, improvement in  mood, brighter affect.   CONDITION ON DISCHARGE:  The patient was discharged in improved condition.  Given medication education.   DISCHARGE MEDICATIONS:  1.  Lexapro 10 mg q.a.m.  2.  Risperdal 0.5 mg at 9 p.m.  3.  Ativan 0.5 mg q.6h. p.r.n. anxiety.  4.  Ambien 10 mg, 1/2-1 q.h.s.  FOLLOW UP:  The patient was to follow up at [facility] on  Feb 25, 2005 at 11:15 a.m. with Jamas Lav, M.D. and therapy to be  arranged.   DISCHARGE DIAGNOSES:   AXIS I:  Major depressive disorder, initial episode, severe.   AXIS II:  Deferred.   AXIS III:  1.  Obstructive lung disease.  2.  Possible occupational asthma.  3.  Reflux-induced respiratory disease.  4.  Systemic arterial hypertension.   AXIS IV:  Moderate to severe (stressors related to uncontrollable issues  with financial stress generated by spouse as per patient as well as other  medical problems and health issues).   AXIS  V:  Global Assessment of Functioning on discharge 55.       JEM/MEDQ  D:  03/23/2005  T:  03/24/2005  Job:  [id number]

## 2011-02-21 NOTE — H&P (Signed)
Christian Harrison, RASH                 ACCOUNT NO.:  [id number]   MEDICAL RECORD NO.:  [id number]          PATIENT TYPE:  IPS   LOCATION:  0302                          [facility]ACILITY:  [facility]   PHYSICIAN:  Geoffery Lyons, M.D.      DATE OF BIRTH:  [birth year]   DATE OF ADMISSION:  02/11/2005  DATE OF DISCHARGE:                         PSYCHIATRIC ADMISSION ASSESSMENT   TIME OF EXAM:  9:10 a.m.   IDENTIFYING INFORMATION:  This is a 65 year old white male who is married.  This is an involuntary admission.  His chief complaint was I was only  trying to go to sleep.   HISTORY OF PRESENT ILLNESS:  This patient was petitioned after admitting to  his primary care physician that he had taken extra Phenergan tablets in  order to get to sleep.  He said that he only took 4.  The patient's wife  contended to the PCP that he had taken an excessive amount of Phenergan  along with some Valium tablets, which the patient denies.  He denies that he  was trying to kill himself, but says that he just wanted to go to sleep.  He  admits that he has not been able to sleep hardly at all in the past 66-3 days  because of constant ruminating worries.  He does not know what he is going  to do to get over the pile of bills at his house.  His wife has a problem  with overspending, and she steals checks and his bank cards from him,  running up charges that he cannot control.  He loves her and wants to remain  in the marriage but is worried about their financial situation.  He was also  taken out of work in December 2005 by his pulmonologist because of a long  history of asthma and COPD aggravated by occupational environment.  He has  endorsed depression for approximately 6 months, endorsing sleeping not much  more than 2 hours per night, constant worry, helplessness, anhedonia and  decreased concentration.  His weight has been stable, but his appetite poor.  He denies homicidal thought.  He denies active suicidal thought.   PAST PSYCHIATRIC HISTORY:  This is the patient's first psychiatric inpatient  admission and first admission to [facility].  He  has been treated recently by his primary care physician who had started him  on Lexapro around January 2006 which he said he did fine on.  It gave him no  side effects, but he did not take it on a regular basis.  The physician  discontinued the Lexapro and started him on Cymbalta this past month, which  the patient says caused him to have some headache and GI upset.  He has also  not been taking that on a regular basis.  He denies that this was a suicide  attempt, denies any prior suicide attempts.  He does admit to being  overwhelmed, depressed and anxious about their finances.   SOCIAL HISTORY:  The patient has a basic education.  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  He is  divorced from his first wife to whom he was married more than 30 years.  He  has no children.  He is now married approximately 8 years to the current  wife and has some stepchildren by her.  He endorses significant financial  stressors.   ALCOHOL AND DRUG HISTORY:  Patient denies alcohol and drug history.  Patient  denies any past or current substance abuse.   MEDICAL HISTORY:  Patient is followed by Dr. Dimas Alexandria, MD at  [facility] and by Dr. Ardyth Harps, his pulmonologist, at [facility] [place]   MEDICAL PROBLEMS:  1.  Systemic arterial hypertension.  2.  Reflux-induced respiratory disease.  3.  Obstructive lung disease with possible occupational asthma.   MEDICATIONS:  1.  Prevacid 30 mg.  2.  Solu tab p.o. b.i.d.  3.  Cymbalta which he is not taking.  4.  Foradil 1 puff once a day.  5.  Spiriva 1 puff once a day.  6.  Nasonex 1 spray each nostril once a day.  7.  Micardis 80 mg daily.  8.  Sular 10 mg daily.  9.  Asthmanex 220 mcg 1  spray daily.   DRUG ALLERGIES:  CYMBALTA which causes headaches and nausea.  PENICILLIN  which causes seizures.  Patient had one seizure from taking penicillin  apparently in early childhood.   POSITIVE PHYSICAL FINDINGS:  This is a well-nourished, well-developed, tall  5 feet 11-1/2 inches, 218 pounds.  He is disheveled and unshaven with poor  hygiene.  Full physical exam in noted in the record and is generally  negative.  He appears to be in no acute respiratory distress.  O2 saturation  running from 97 to 99% since admission.  Full physical examination is noted  in the record today.   PAST MEDICAL HISTORY:  Remarkable for history of asthma since junior high  worse recently the past 6 months of 2005 with frequent attacks, possibly  precipitated by his work environment.   DIAGNOSTIC STUDIES:  Revealed WBC 9.6, hemoglobin 15.8, hematocrit 45.2,  platelets normal at 185,000.  Sodium 142, potassium 4.0, chloride 105, CO2  29, BUN 18, creatinine 1, glucose random at 108 mg%.  Liver enzymes were  within normal limits.  TSH within normal limits at 0.754.   MENTAL STATUS EXAM:  This is a fully alert male who is disheveled, unshaven  with poor eye contact.  Psychomotor slowing is noted and affect flattening,  some mild guarding.  Speech is slowed, decreased in amount, short responses  and generally offers little.  Thoughts are somewhat rambling at times.  Mood  is depressed, frustrated, slightly guarded.  Thought process logical and  coherent with slowed thoughts, difficulty with processing.  Positive  suicidal ideation without an active plan.  Strong sense of hopelessness.  No  homicidal thought, no auditory or visual hallucinations, no flight of ideas  or ideas of reference.  Cognitively he is intact and oriented x 3.  Intellect within normal limits.  Insight satisfactory.  Impulse control and judgment within normal limits.  Patient repeatedly talks about how much he  loves his wife but  feels helpless to deal with her spending.   ADMISSION DIAGNOSES:   AXIS I:  Major depression, initial episode, severe.   AXIS II:  Deferred.   AXIS III:  1.  Obstructive lung disease with possible occupational asthma.  2.  Reflux-induced respiratory disease.  3.  Systemic arterial hypertension.   AXIS IV:  __________ .   AXIS V:  Current 35, past year 52.   PLAN:  Involuntarily admit the patient with every 15 minute checks in place.  We are going to ask the case manager to insure that weapons are secured at  home, and are going to order a family session as soon as possible with the  patient's wife.  He is agreeable to that.  Meanwhile, we are going to start  him back on the Lexapro 5 mg today followed by 10 mg daily starting  tomorrow, Feb 13, 2005.  We will also add Risperdal 0.5 mg p.o. q.h.s. to  address his neurovegetative symptoms.  We have discussed the plan of care  with him, and he is in agreement.  Meanwhile we are going to watch his vital  signs closely including a pulse ox with his vital signs every shift, and we  will restart his respiratory and other routine medications which have been  validated with the documents provided by his pulmonologist and his internal  medicine physician.  Estimated length of stay is 5 days.      MAS/MEDQ  D:  02/12/2005  T:  02/12/2005  Job:  [id number]

## 2011-02-21 NOTE — H&P (Signed)
[place]  Patient:    Christian Harrison                   MRN: [id number] Adm. Date:  [id number] Attending:  Vashti Hey Dictator:   Lillia Carmel Barefoot, P.A.                         History and Physical  DATE OF BIRTH:  11/14/1945  CHIEF COMPLAINT:  Abdominal pain.  HISTORY OF PRESENT ILLNESS:   The patient is a 65 year old white male patient of Marcy [facility]vo [facility]. Eloise Harman., M.D. with history of hypertension and asthma presents to ER with abdominal pain, nausea, and vomiting. He felt fine Thursday morning and  worked throughout the day. By Thursday evening, he had onset of nausea, vomiting, and abdominal pain. Abdominal pain has been consistent and continuous. His last  bowel movement was October 04, 1999. That is described as a very small amount f a watery stool. He did not have a normal bowel movement that day. Abdominal pain as worsened, and he is unable to keep down any foods or fluids. Denies hematemesis or coffee-ground-like substance, melena, or hematochezia. It was reported to me that he occult-negative stool in the emergency room. He has no previous abdominal surgeries or any history of hernia. He has no family history of colon cancer. In the ER, x-ray did reveal dilated loops of bowel, and he is admitted for small-bowel obstruction.  CURRENT MEDICATIONS: 1. Cozaar 50 mg q.d. 2. Albuterol inhaler 2 puffs as needed.  ALLERGIES:  PENICILLIN seizures.  FAMILY HISTORY:  Mother deceased at 15 with questionable congestive heart failure. Father deceased at 10 of gunshot wound. One brother alive and well at 45. He is  only certain of his grandparents medical history.  SOCIAL HISTORY:  He is married without any children. Works as a Psychologist, occupational. Denies ny alcohol or tobacco use.  PAST MEDICAL HISTORY: 1. Hypertension. 2. Asthma.  REVIEW OF SYSTEMS:  He denies any chest pain, shortness of breath,  dyspnea, paroxysmal nocturnal dyspnea, hearing deficits, visual deficits, history of cancer of prostate, eye problems, abdominal problems, or any neurological complaints. e does have asthma. Wears upper and lower dentures and has hypertension.  LABORATORY DATA:  A CBC is normal with white blood cell count of 8.8. Comprehensive metabolic panel is normal, except potassium decreased at 3.2, glucose increased at 162, BUN increased at 34. Urinalysis is negative.  X-ray did reveal dilated loops of bowel.  PHYSICAL EXAMINATION:  VITAL SIGNS:  Temperature 98.4, respirations 20, pulse 78, blood pressure 122/80.  GENERAL:  A pleasant, well-developed, well-nourished white male resting comfortably supine in no acute distress.  SKIN:  Warm and dry. There is no rash.  HEENT:  Bilaterally TMs are clear. ______ is nontender. Oropharynx with upper and lower dentures absent. Normocephalic, atraumatic.  NECK:  Supple without lymphadenopathy, thyromegaly, or bruits.  LUNGS:  Clear to auscultation bilaterally.  CARDIOVASCULAR:  Regular rate and rhythm. No murmur, gallop, or rub.  ABDOMEN:  Distended. He is tender specifically in the mid and left lower quadrants. Bowel sounds are decreased. Rebound pain is present. There is no mass or organomegaly.  RECTAL:  Heme negative in the ER without impaction. This is not repeated.  EXTREMITIES:  Without edema.  NEUROLOGICAL:  Alert and oriented x 3. His gait is not tested.  ASSESSMENT: 1. A probable small-bowel obstruction. 2. Hypertension (treated with Cozaar). 3. Asthma (  treated with p.r.n. albuterol). 4. Hyperglycemia (162). 5. Elevated BUN, probable dehydrated, BUN 34.  PLAN: 1. Admit to [facility]. Eloise Harman., M.D. for further evaluation and treatment. 2. Will place n.p.o. and begin IV fluids. 3. Will continue home medications. Phenergan and Demerol have been provided for    pain as needed. 4. Will follow up elevated blood  sugar. 5. Will closely monitor his stools, all ins and outs, and obtain general surgery    consultation.DD:  10/06/99 TD:  10/06/99 Job: [id number]

## 2011-03-24 ENCOUNTER — Other Ambulatory Visit: Payer: Self-pay | Admitting: Oncology

## 2011-03-24 ENCOUNTER — Encounter (HOSPITAL_BASED_OUTPATIENT_CLINIC_OR_DEPARTMENT_OTHER): Payer: Medicare Other | Admitting: Oncology

## 2011-03-24 DIAGNOSIS — C180 Malignant neoplasm of cecum: Secondary | ICD-10-CM

## 2011-03-24 DIAGNOSIS — I10 Essential (primary) hypertension: Secondary | ICD-10-CM

## 2011-03-24 DIAGNOSIS — Z5111 Encounter for antineoplastic chemotherapy: Secondary | ICD-10-CM

## 2011-03-24 LAB — COMPREHENSIVE METABOLIC PANEL
AST: 22 U/L (ref 0–37)
Alkaline Phosphatase: 93 U/L (ref 39–117)
BUN: 22 mg/dL (ref 6–23)
Creatinine, Ser: 0.88 mg/dL (ref 0.50–1.35)
Potassium: 3.4 mEq/L — ABNORMAL LOW (ref 3.5–5.3)
Total Bilirubin: 0.7 mg/dL (ref 0.3–1.2)

## 2011-03-24 LAB — CBC WITH DIFFERENTIAL/PLATELET
Basophils Absolute: 0 10*3/uL (ref 0.0–0.1)
EOS%: 1.3 % (ref 0.0–7.0)
HGB: 16 g/dL (ref 13.0–17.1)
MCH: 33.4 pg (ref 27.2–33.4)
MCV: 95.7 fL (ref 79.3–98.0)
MONO%: 8.1 % (ref 0.0–14.0)
RDW: 13 % (ref 11.0–14.6)

## 2011-03-25 ENCOUNTER — Ambulatory Visit (HOSPITAL_COMMUNITY)
Admission: RE | Admit: 2011-03-25 | Discharge: 2011-03-25 | Disposition: A | Discharge status: Home / Self Care | Payer: Medicare Other | Source: Ambulatory Visit | Attending: Oncology | Admitting: Oncology

## 2011-03-25 ENCOUNTER — Encounter (HOSPITAL_COMMUNITY): Payer: Self-pay

## 2011-03-25 DIAGNOSIS — C189 Malignant neoplasm of colon, unspecified: Secondary | ICD-10-CM

## 2011-03-25 DIAGNOSIS — R109 Unspecified abdominal pain: Secondary | ICD-10-CM | POA: Insufficient documentation

## 2011-03-25 DIAGNOSIS — K439 Ventral hernia without obstruction or gangrene: Secondary | ICD-10-CM | POA: Insufficient documentation

## 2011-03-25 DIAGNOSIS — K7689 Other specified diseases of liver: Secondary | ICD-10-CM | POA: Insufficient documentation

## 2011-03-25 HISTORY — DX: Malignant neoplasm of colon, unspecified: C18.9

## 2011-03-25 MED ORDER — IOHEXOL 300 MG/ML  SOLN
125.0000 mL | Freq: Once | INTRAMUSCULAR | Status: AC | PRN
  Administered 2011-03-25: 125 mL via INTRAVENOUS

## 2011-03-27 ENCOUNTER — Encounter (INDEPENDENT_AMBULATORY_CARE_PROVIDER_SITE_OTHER): Payer: Self-pay | Admitting: General Surgery

## 2011-03-31 ENCOUNTER — Ambulatory Visit (INDEPENDENT_AMBULATORY_CARE_PROVIDER_SITE_OTHER): Payer: Medicare Other | Admitting: General Surgery

## 2011-04-01 ENCOUNTER — Encounter (HOSPITAL_BASED_OUTPATIENT_CLINIC_OR_DEPARTMENT_OTHER): Payer: Medicare Other | Admitting: Oncology

## 2011-04-22 ENCOUNTER — Ambulatory Visit (INDEPENDENT_AMBULATORY_CARE_PROVIDER_SITE_OTHER): Payer: Medicare Other | Admitting: General Surgery

## 2011-04-22 ENCOUNTER — Encounter (INDEPENDENT_AMBULATORY_CARE_PROVIDER_SITE_OTHER): Payer: Self-pay | Admitting: General Surgery

## 2011-04-22 VITALS — BP 132/82 | HR 60 | Temp 96.8°F | Ht 70.0 in | Wt 215.6 lb

## 2011-04-22 DIAGNOSIS — K432 Incisional hernia without obstruction or gangrene: Secondary | ICD-10-CM

## 2011-04-22 NOTE — Patient Instructions (Signed)
You will be scheduled for a colonoscopy with Dr. Evette Cristal. Our office will cancel the Port-A-Cath removal which is scheduled for this Friday, and when we schedule your ventral hernia repair, we will remove the Port-A-Cath at the same time. We will ask Dr. Windle Guard  to provide medical clearance for you to have this surgery.

## 2011-04-22 NOTE — Progress Notes (Signed)
Subjective:     Patient ID: Christian Harrison, male   DOB: [birth year], 65 y.o.   MRN: [id number]  HPI Mr. Prats was referred back to me by Dr. Truett Perna for consideration of repair of a ventral incisional hernia.  Mr. Rathman underwent a right colectomy on March 21, 2010 for a stage T3,N1b. adenocarcinoma. A Port-A-Cath was inserted and he received adjuvant chemotherapy. He is actually scheduled for removal of the Port-A-Cath this coming Friday.  He reports a three-month history of a bulge in his upper midline. He is a citizen minimally uncomfortable. He has not had any severe pain. He was sent to me by Dr. Prentiss Bells for evaluation.  He is due for a colonoscopy but has not scheduled that yet. He is doing well otherwise. Recent CT scan of the chest abdomen and pelvis shows no evidence of recurrent cancer and there is a small midline epigastric hernia containing fat only. Recent lab work including a CBC, complete metabolic panel and CEA are all normal.  We discussed the hernia and he would like to have this repaired before it gets any bigger.  Past Medical History  Diagnosis Date  . Asthma   . Hypertension   . Colon cancer     colon ca dx 02/2010  . COPD (chronic obstructive pulmonary disease)   . Hernia    Current outpatient prescriptions:ALBUTEROL IN, Inhale 17.5 mg into the lungs.  , Disp: , Rfl: ;  budesonide-formoterol (SYMBICORT) 80-4.5 MCG/ACT inhaler, Inhale 2 puffs into the lungs 2 (two) times daily.  , Disp: , Rfl: ;  citalopram (CELEXA) 40 MG tablet, Take 40 mg by mouth daily.  , Disp: , Rfl: ;  LISINOPRIL-HYDROCHLOROTHIAZIDE PO, Take 20-25 mg by mouth daily.  , Disp: , Rfl:  Nebulizer MISC, 2.5 mg by Does not apply route as needed.  , Disp: , Rfl: ;  tiotropium (SPIRIVA) 18 MCG inhalation capsule, Place 18 mcg into inhaler and inhale daily.  , Disp: , Rfl: ;  ZOLPIDEM TARTRATE PO, Take 10 mg by mouth as needed.  , Disp: , Rfl:   Allergies  Allergen Reactions  . Penicillins Anaphylaxis and  Other (See Comments)    Seizures   Family History  Problem Relation Age of Onset  . Heart disease Mother    History  Substance Use Topics  . Smoking status: Never Smoker   . Smokeless tobacco: Not on file  . Alcohol Use: No   Review of Systems  Constitutional: Negative.   HENT: Negative.   Eyes: Negative.   Respiratory: Negative.   Cardiovascular: Negative.   Gastrointestinal: Negative.   Genitourinary: Negative.   Neurological: Negative.   Hematological: Negative.        Objective:   Physical Exam  Constitutional: He is oriented to person, place, and time. He appears well-developed and well-nourished. No distress.  HENT:  Head: Normocephalic.  Mouth/Throat: Oropharynx is clear and moist. No oropharyngeal exudate.  Eyes: Conjunctivae are normal. Pupils are equal, round, and reactive to light. Left eye exhibits no discharge. No scleral icterus.  Neck: Normal range of motion. Neck supple. No JVD present. No thyromegaly present.  Cardiovascular: Normal rate, regular rhythm and normal heart sounds.  Exam reveals no friction rub.   No murmur heard. Pulmonary/Chest: Effort normal and breath sounds normal. No respiratory distress. He has no wheezes. He has no rales. He exhibits no tenderness.  Abdominal: Soft. Bowel sounds are normal. He exhibits no distension and no mass. There is no tenderness. There is  no rebound and no guarding.    Musculoskeletal: Normal range of motion. He exhibits no edema and no tenderness.  Lymphadenopathy:    He has no cervical adenopathy.  Neurological: He is alert and oriented to person, place, and time. He exhibits normal muscle tone. Coordination normal.  Skin: Skin is warm and dry. No rash noted. No erythema. No pallor.  Psychiatric: Judgment and thought content normal.       Assessment:     Ventral incisional hernia, midepigastrium, 4-5 cm defect, reducible.  Desires Port-A-Cath removal.  History of adenocarcinoma of the right colon, no  evidence of recurrence one year postop. Status post adjuvant chemotherapy.  Asthma.  Hypertension.  Past history atrial fibrillation, resolved.    Plan:     The patient will be scheduled for laparoscopic repair of ventral incisional hernia with mesh, possible open. We will remove the Port-A-Cath at the same time.  We will cancel the Port-A-Cath removal that is scheduled for this Friday.  The patient is due for a colonoscopy one year following resection of this cancer, and we are going to refer the patient to Dr. Evette Cristal for the colonoscopy to be done prior to the hernia repair.  The patient will be referred to Dr. Windle Guard, his primary care physician, for medical clearance prior to this surgery due to his pulmonary and cardiac disease.  I discussed the indications and details of the surgery with the patient and his wife. Risks and complications have been outlined, including but not limited to bleeding, infection, conversion to open laparotomy, recurrence of the hernia, injury to adjacent organs with major reconstructive surgery, cardiac pulmonary and thromboembolic problems. He seems to understand these issues well. At this time all his questions are answered. He is in full agreement with this plan.

## 2011-04-23 ENCOUNTER — Telehealth (INDEPENDENT_AMBULATORY_CARE_PROVIDER_SITE_OTHER): Payer: Self-pay

## 2011-04-23 NOTE — Telephone Encounter (Signed)
Pt's wife calling to report that he is now having pain from his hernia.  I recommended Ibuprofen, but she says it has not helped.  Spoke with Dr. Derrell Lolling.  He does not want to prescribe pain meds.  Recommended reducing hernia and rest until surgery.  The pt's wife understood and will let her husband know.

## 2011-05-08 ENCOUNTER — Telehealth (INDEPENDENT_AMBULATORY_CARE_PROVIDER_SITE_OTHER): Payer: Self-pay

## 2011-05-08 NOTE — Telephone Encounter (Signed)
I left pt msg to call [id number] up colonoscopy with Dr Evette Cristal. Pt was to make his appt per pt request due to owing money to Dr Luan Moore office. Per Amy at Dr Hennie Duos office pt advised them that pt thought Dr Derrell Lolling was doing pts colonoscopy. I advised Amy that Dr Derrell Lolling does not do colonoscopy and that pt req to contact Dr Evette Cristal since he had money issues with them.

## 2011-05-14 ENCOUNTER — Telehealth (INDEPENDENT_AMBULATORY_CARE_PROVIDER_SITE_OTHER): Payer: Self-pay | Admitting: General Surgery

## 2011-05-14 NOTE — Telephone Encounter (Signed)
Patient called back and i gave message re: dr Evette Cristal doing colonoscopy and pt will call him to have that set up//[phone]

## 2011-07-31 ENCOUNTER — Telehealth (INDEPENDENT_AMBULATORY_CARE_PROVIDER_SITE_OTHER): Payer: Self-pay

## 2011-07-31 NOTE — Telephone Encounter (Signed)
Pt called to review what he needs to do before he schedules his surgery. I reviewed Dr Jacinto Halim note.Pt can schedule to have pac removed at time of surgery.Advised pt he needs his colonoscopy prior to scheduling surgery and since it has been 3 mon he will need quick ov prior to surgery. Pt states he will set up scope soon and call back with this date.

## 2011-08-14 ENCOUNTER — Telehealth (INDEPENDENT_AMBULATORY_CARE_PROVIDER_SITE_OTHER): Payer: Self-pay

## 2011-08-14 NOTE — Telephone Encounter (Signed)
LMOM per pt request I have moved pts appt up to 08-18-11 arrive at 10:30 am. Pt to call to confirm this chg.

## 2011-08-18 ENCOUNTER — Ambulatory Visit (INDEPENDENT_AMBULATORY_CARE_PROVIDER_SITE_OTHER): Payer: Medicare Other | Admitting: General Surgery

## 2011-08-18 ENCOUNTER — Encounter (INDEPENDENT_AMBULATORY_CARE_PROVIDER_SITE_OTHER): Payer: Self-pay | Admitting: General Surgery

## 2011-08-18 ENCOUNTER — Other Ambulatory Visit (INDEPENDENT_AMBULATORY_CARE_PROVIDER_SITE_OTHER): Payer: Self-pay | Admitting: General Surgery

## 2011-08-18 VITALS — BP 138/90 | HR 64 | Temp 96.2°F | Resp 18 | Ht 70.0 in | Wt 217.1 lb

## 2011-08-18 DIAGNOSIS — K432 Incisional hernia without obstruction or gangrene: Secondary | ICD-10-CM

## 2011-08-18 NOTE — Progress Notes (Signed)
Chief Complaint  Patient presents with  . Routine Post Op    recheck hernia and PAC and discuss sx    HPI Christian Harrison is a 65 y.o. male.    Christian Harrison states that he saw Dr. Evette Cristal and had a colonoscopy and was told everything is fine. He is here today to schedule repair of his ventral hernia and removal of his Port-A-Cath. He was feels well. There has been no change in his medical condition.  Recall that he underwent right colectomy March 21, 2010 for stage T3,N1 B. carcinoma of the colon. Port-A-Cath was inserted. He has completed his adjuvant chemotherapy and has been released to have this removed by Dr. Mancel Bale.  We talked about the indications and details of laparoscopic repair of his ventral hernia with mesh. He has been told this might be need be converted to open. We talked about removal of Port-A-Cath. We discussed indications risks and complications one more time. All his questions were answered. He is ready to proceed with planning. HPI  Past Medical History  Diagnosis Date  . Asthma   . Hypertension   . Colon cancer     colon ca dx 02/2010  . COPD (chronic obstructive pulmonary disease)   . Hernia     Past Surgical History  Procedure Date  . Hand surgery     LEFT  . Colon surgery     Family History  Problem Relation Age of Onset  . Heart disease Mother     Social History History  Substance Use Topics  . Smoking status: Never Smoker   . Smokeless tobacco: Never Used  . Alcohol Use: No    Allergies  Allergen Reactions  . Penicillins Anaphylaxis and Other (See Comments)    Seizures    Current Outpatient Prescriptions  Medication Sig Dispense Refill  . ALBUTEROL IN Inhale 17.5 mg into the lungs.        . budesonide-formoterol (SYMBICORT) 80-4.5 MCG/ACT inhaler Inhale 2 puffs into the lungs 2 (two) times daily.        . citalopram (CELEXA) 40 MG tablet Take 40 mg by mouth daily.        Marland Kitchen LISINOPRIL-HYDROCHLOROTHIAZIDE PO Take 20-25 mg by mouth  daily.        . Nebulizer MISC 2.5 mg by Does not apply route as needed.        . tiotropium (SPIRIVA) 18 MCG inhalation capsule Place 18 mcg into inhaler and inhale daily.        Marland Kitchen ZOLPIDEM TARTRATE PO Take 10 mg by mouth as needed.          Review of Systems Review of Systems  Constitutional: Negative for fever, chills and unexpected weight change.  HENT: Negative for hearing loss, congestion, sore throat, trouble swallowing and voice change.   Eyes: Negative for visual disturbance.  Respiratory: Negative for cough and wheezing.   Cardiovascular: Negative for chest pain, palpitations and leg swelling.  Gastrointestinal: Positive for abdominal pain. Negative for nausea, vomiting, diarrhea, constipation, blood in stool, abdominal distention, anal bleeding and rectal pain.  Genitourinary: Negative for hematuria and difficulty urinating.  Musculoskeletal: Negative for arthralgias.  Skin: Negative for rash and wound.  Neurological: Negative for seizures, syncope, weakness and headaches.  Hematological: Negative for adenopathy. Does not bruise/bleed easily.  Psychiatric/Behavioral: Negative for confusion.    Blood pressure 138/90, pulse 64, temperature 96.2 F (35.7 C), temperature source Temporal, resp. rate 18, height 5\' 10"  (1.778 m), weight 217 lb  2 oz (98.487 kg).  Physical Exam Physical Exam  Constitutional: He is oriented to person, place, and time. He appears well-developed and well-nourished.  Neck: Normal range of motion. Neck supple. No JVD present. No tracheal deviation present. No thyromegaly present.  Cardiovascular: Normal rate, regular rhythm, normal heart sounds and intact distal pulses.   No murmur heard. Pulmonary/Chest: Breath sounds normal. No respiratory distress. He has no wheezes. He has no rales.       Port left infraclavicular area.  Abdominal: Soft. Bowel sounds are normal. He exhibits mass. He exhibits no distension. There is no tenderness. There is no rebound  and no guarding.    Musculoskeletal: Normal range of motion. He exhibits no edema and no tenderness.  Lymphadenopathy:    He has no cervical adenopathy.  Neurological: He is alert and oriented to person, place, and time. He exhibits normal muscle tone. Coordination normal.  Skin: Skin is warm and dry. No rash noted. He is not diaphoretic. No erythema. No pallor.  Psychiatric: He has a normal mood and affect. His behavior is normal. Judgment normal.    Data Reviewed I requested a colonoscopy report from Dr. Evette Cristal. I have requested cardiac and medical clearance with his primary care physician.. I reviewed his old records.  Assessment   Ventral incisional hernia, midepigastrium, 4-6 cm defect, reducible, symptomatic.  Desires Port-A-Cath removal  History carcinoma right colon, no evidence of recurrence one year postop. Status post adjuvant chemotherapy  Asthma  Hypertension  Past history antral fibrillation, resolved.      Plan    Schedule for laparoscopic repair of ventral incisional hernia with mesh, possible open, and removal of Port-A-Cath.       Devinne Epstein M 08/18/2011, 11:08 AM

## 2011-08-18 NOTE — Patient Instructions (Signed)
You will be scheduled for surgery. The surgery will be a laparoscopic repair of your ventral hernia with mesh, possible conversion to open surgery. We will also remove your Port-A-Cath at the same time.

## 2011-09-01 ENCOUNTER — Encounter (INDEPENDENT_AMBULATORY_CARE_PROVIDER_SITE_OTHER): Payer: Self-pay | Admitting: Gastroenterology

## 2011-09-03 ENCOUNTER — Encounter (HOSPITAL_COMMUNITY): Payer: Self-pay | Admitting: Pharmacy Technician

## 2011-09-08 ENCOUNTER — Encounter (INDEPENDENT_AMBULATORY_CARE_PROVIDER_SITE_OTHER): Payer: Medicare Other | Admitting: General Surgery

## 2011-09-08 ENCOUNTER — Encounter (HOSPITAL_COMMUNITY)
Admission: RE | Admit: 2011-09-08 | Discharge: 2011-09-08 | Disposition: A | Discharge status: Home / Self Care | Payer: Medicare Other | Source: Ambulatory Visit | Attending: General Surgery | Admitting: General Surgery

## 2011-09-08 ENCOUNTER — Encounter (HOSPITAL_COMMUNITY): Payer: Self-pay

## 2011-09-08 DIAGNOSIS — J449 Chronic obstructive pulmonary disease, unspecified: Secondary | ICD-10-CM

## 2011-09-08 DIAGNOSIS — C189 Malignant neoplasm of colon, unspecified: Secondary | ICD-10-CM

## 2011-09-08 HISTORY — DX: Chronic obstructive pulmonary disease, unspecified: J44.9

## 2011-09-08 HISTORY — DX: Malignant neoplasm of colon, unspecified: C18.9

## 2011-09-08 HISTORY — PX: DG FINGER*L*: HXRAD218

## 2011-09-08 HISTORY — PX: COLON SURGERY: SHX602

## 2011-09-08 LAB — CBC
MCV: 91.5 fL (ref 78.0–100.0)
Platelets: 237 10*3/uL (ref 150–400)
RBC: 4.72 MIL/uL (ref 4.22–5.81)
WBC: 8 10*3/uL (ref 4.0–10.5)

## 2011-09-08 LAB — BASIC METABOLIC PANEL
CO2: 27 mEq/L (ref 19–32)
Chloride: 105 mEq/L (ref 96–112)
Creatinine, Ser: 0.86 mg/dL (ref 0.50–1.35)
GFR calc Af Amer: >90 mL/min (ref >90)
Potassium: 3.9 mEq/L (ref 3.5–5.1)
Sodium: 140 mEq/L (ref 135–145)

## 2011-09-08 LAB — SURGICAL PCR SCREEN
MRSA, PCR: NEGATIVE
Staphylococcus aureus: NEGATIVE

## 2011-09-08 NOTE — Patient Instructions (Signed)
19 Christian Harrison  09/08/2011   Your procedure is scheduled on:09-10-11  Report to [facility] at  08:00 AM.  Call this number if you have problems the morning of surgery: [phone]   Remember:   Do not eat food:After Midnight.  May have clear liquids:until Midnight .  Clear liquids include soda, tea, black coffee, apple or grape juice, broth.  Take these medicines the morning of surgery with A SIP OF WATER:Celexa, Use and bring Symbicort and bring Albuuterol inhalers   Do not wear jewelry, make-up or nail polish.  Do not wear lotions, powders, or perfumes. You may wear deodorant.  Do not shave 48 hours prior to surgery.  Do not bring valuables to the hospital.  Contacts, dentures or bridgework may not be worn into surgery.  Leave suitcase in the car. After surgery it may be brought to your room.  For patients admitted to the hospital, checkout time is 11:00 AM the day of discharge.   Patients discharged the day of surgery will not be allowed to drive home.  Name and phone number of your driver: Clevester Helzer, spouse [phone]  Special Instructions: CHG Shower Use Special Wash: 1/2 bottle night before surgery and 1/2 bottle morning of surgery.   Please read over the following fact sheets that you were given: MRSA Information

## 2011-09-08 NOTE — Pre-Procedure Instructions (Signed)
09-08-11 EKG requested of 7'12 from Dr. Jeannetta Nap office.

## 2011-09-09 ENCOUNTER — Other Ambulatory Visit (INDEPENDENT_AMBULATORY_CARE_PROVIDER_SITE_OTHER): Payer: Self-pay | Admitting: General Surgery

## 2011-09-09 MED ORDER — SODIUM CHLORIDE 0.9 % IV SOLN
1250.0000 mg | INTRAVENOUS | Status: AC
  Administered 2011-09-10: 1250 mg via INTRAVENOUS
  Filled 2011-09-09 (×2): qty 1250

## 2011-09-09 NOTE — H&P (Signed)
  Christian Harrison   08/18/2011 11:00 AM Office Visit  MRN: [id number]   Description: 65 year old male  Provider: Darrell Hauk M, MD  Department: [facility]        Diagnoses     Incisional hernia   - Primary    553.21      Reason for Visit     Routine Post Op    recheck hernia and PAC and discuss sx        Vitals - Last Recorded       BP Pulse Temp(Src) Resp Ht Wt    138/90  64  96.2 F (35.7 C) (Temporal)  18  5' 10" (1.778 m)  217 lb 2 oz (98.487 kg)          BMI              31.15 kg/m2                 Progress Notes     Farran Amsden M, MD  08/18/2011 11:17 AM  SignedChief Complaint   Patient presents with   .  Routine Post Op       recheck hernia and PAC and discuss sx      HPI Christian Harrison is a 65 y.o. male.     Christian Harrison states that Christian Harrison saw Dr. Ganem and had a colonoscopy and was told everything is fine. Christian Harrison is here today to schedule repair of his ventral hernia and removal of his Port-A-Cath. Christian Harrison was feels well. There has been no change in his medical condition.   Recall that Christian Harrison underwent right colectomy March 21, 2010 for stage T3,N1 B. carcinoma of the colon. Port-A-Cath was inserted. Christian Harrison has completed his adjuvant chemotherapy and has been released to have this removed by Dr. [name] Sherrill.   We talked about the indications and details of laparoscopic repair of his ventral hernia with mesh. Christian Harrison has been told this might be need be converted to open. We talked about removal of Port-A-Cath. We discussed indications risks and complications one more time. All his questions were answered. Christian Harrison is ready to proceed with planning. HPI    Past Medical History   Diagnosis  Date   .  Asthma     .  Hypertension     .  Colon cancer         colon ca dx 02/2010   .  COPD (chronic obstructive pulmonary disease)     .  Hernia         Past Surgical History   Procedure  Date   .  Hand surgery         LEFT   .  Colon surgery         Family History     Problem  Relation  Age of Onset   .  Heart disease  Mother        Social History History   Substance Use Topics   .  Smoking status:  Never Smoker    .  Smokeless tobacco:  Never Used   .  Alcohol Use:  No       Allergies   Allergen  Reactions   .  Penicillins  Anaphylaxis and Other (See Comments)       Seizures       Current Outpatient Prescriptions   Medication  Sig  Dispense  Refill   .  ALBUTEROL IN  Inhale 17.5 mg into the lungs.           .    budesonide-formoterol (SYMBICORT) 80-4.5 MCG/ACT inhaler  Inhale 2 puffs into the lungs 2 (two) times daily.           .  citalopram (CELEXA) 40 MG tablet  Take 40 mg by mouth daily.           .  LISINOPRIL-HYDROCHLOROTHIAZIDE PO  Take 20-25 mg by mouth daily.           .  Nebulizer MISC  2.5 mg by Does not apply route as needed.           .  tiotropium (SPIRIVA) 18 MCG inhalation capsule  Place 18 mcg into inhaler and inhale daily.           .  ZOLPIDEM TARTRATE PO  Take 10 mg by mouth as needed.              Review of Systems Review of Systems  Constitutional: Negative for fever, chills and unexpected weight change.  HENT: Negative for hearing loss, congestion, sore throat, trouble swallowing and voice change.   Eyes: Negative for visual disturbance.  Respiratory: Negative for cough and wheezing.   Cardiovascular: Negative for chest pain, palpitations and leg swelling.  Gastrointestinal: Positive for abdominal pain. Negative for nausea, vomiting, diarrhea, constipation, blood in stool, abdominal distention, anal bleeding and rectal pain.  Genitourinary: Negative for hematuria and difficulty urinating.  Musculoskeletal: Negative for arthralgias.  Skin: Negative for rash and wound.  Neurological: Negative for seizures, syncope, weakness and headaches.  Hematological: Negative for adenopathy. Does not bruise/bleed easily.  Psychiatric/Behavioral: Negative for confusion.    Blood pressure 138/90, pulse 64, temperature 96.2 F  (35.7 C), temperature source Temporal, resp. rate 18, height 5' 10" (1.778 m), weight 217 lb 2 oz (98.487 kg).   Physical Exam Physical Exam  Constitutional: Christian Harrison is oriented to person, place, and time. Christian Harrison appears well-developed and well-nourished.  Neck: Normal range of motion. Neck supple. No JVD present. No tracheal deviation present. No thyromegaly present.  Cardiovascular: Normal rate, regular rhythm, normal heart sounds and intact distal pulses.    No murmur heard. Pulmonary/Chest: Breath sounds normal. No respiratory distress. Christian Harrison has no wheezes. Christian Harrison has no rales.       Port left infraclavicular area.  Abdominal: Soft. Bowel sounds are normal. Christian Harrison exhibits mass. Christian Harrison exhibits no distension. There is no tenderness. There is no rebound and no guarding.    Musculoskeletal: Normal range of motion. Christian Harrison exhibits no edema and no tenderness.  Lymphadenopathy:    Christian Harrison has no cervical adenopathy.  Neurological: Christian Harrison is alert and oriented to person, place, and time. Christian Harrison exhibits normal muscle tone. Coordination normal.  Skin: Skin is warm and dry. No rash noted. Christian Harrison is not diaphoretic. No erythema. No pallor.  Psychiatric: Christian Harrison has a normal mood and affect. His behavior is normal. Judgment normal.    Data Reviewed I requested a colonoscopy report from Dr. Ganem. I have requested cardiac and medical clearance with his primary care physician.. I reviewed his old records.   Assessment Ventral incisional hernia, midepigastrium, 4-6 cm defect, reducible, symptomatic.   Desires Port-A-Cath removal   History carcinoma right colon, no evidence of recurrence one year postop. Status post adjuvant chemotherapy   Asthma   Hypertension   Past history antral fibrillation, resolved.      Plan Schedule for laparoscopic repair of ventral incisional hernia with mesh, possible open, and removal of Port-A-Cath.       Vasiliy Mccarry M 08/18/2011, 11:08 AM                  Not recorded       Pending        Disp Refills Start End    heparin injection 5,000 Units     08/18/2011 08/18/2011    Route:  Subcutaneous    Class:  Normal    vancomycin (VANCOCIN) 1,250 mg in sodium chloride 0.9 % 500 mL IVPB     08/18/2011      Route:  Intravenous    Class:  Normal            Patient Instructions     You will be scheduled for surgery. The surgery will be a laparoscopic repair of your ventral hernia with mesh, possible conversion to open surgery. We will also remove your Port-A-Cath at the same time.       Level of Service     PR OFFICE/OUTPT VISIT,EST,LEVL III [99213]         All Flowsheet Templates (all recorded)     Encounter Vitals Flowsheet    Custom Formula Data Flowsheet    Anthropometrics Flowsheet               Referring Provider          Wilson Oliver Elkins       All Charges for This Encounter       Code Description Service Date Service Provider Modifiers Quantity    99213 PR OFFICE/OUTPT VISIT,EST,LEVL III 08/18/2011 Keano Guggenheim M Naythen Heikkila, MD   1        Other Encounter Related Information     Allergies & Medications         Problem List         History         Patient-Entered Questionnaires     No data filed         

## 2011-09-10 ENCOUNTER — Encounter (HOSPITAL_COMMUNITY): Payer: Self-pay | Admitting: *Deleted

## 2011-09-10 ENCOUNTER — Ambulatory Visit (HOSPITAL_COMMUNITY): Payer: Medicare Other | Admitting: Anesthesiology

## 2011-09-10 ENCOUNTER — Other Ambulatory Visit (INDEPENDENT_AMBULATORY_CARE_PROVIDER_SITE_OTHER): Payer: Self-pay | Admitting: General Surgery

## 2011-09-10 ENCOUNTER — Encounter (HOSPITAL_COMMUNITY): Payer: Self-pay | Admitting: Anesthesiology

## 2011-09-10 ENCOUNTER — Encounter (HOSPITAL_COMMUNITY)
Admission: AD | Disposition: A | Discharge status: Home / Self Care | Payer: Self-pay | Source: Ambulatory Visit | Attending: General Surgery

## 2011-09-10 ENCOUNTER — Inpatient Hospital Stay (HOSPITAL_COMMUNITY)
Admission: AD | Admit: 2011-09-10 | Discharge: 2011-09-17 | DRG: 336 | Disposition: A | Discharge status: Home / Self Care | Payer: Medicare Other | Source: Ambulatory Visit | Attending: General Surgery | Admitting: General Surgery

## 2011-09-10 DIAGNOSIS — Z452 Encounter for adjustment and management of vascular access device: Secondary | ICD-10-CM

## 2011-09-10 DIAGNOSIS — Z79899 Other long term (current) drug therapy: Secondary | ICD-10-CM

## 2011-09-10 DIAGNOSIS — Z9049 Acquired absence of other specified parts of digestive tract: Secondary | ICD-10-CM

## 2011-09-10 DIAGNOSIS — Z9221 Personal history of antineoplastic chemotherapy: Secondary | ICD-10-CM

## 2011-09-10 DIAGNOSIS — J4489 Other specified chronic obstructive pulmonary disease: Secondary | ICD-10-CM | POA: Diagnosis present

## 2011-09-10 DIAGNOSIS — I10 Essential (primary) hypertension: Secondary | ICD-10-CM | POA: Diagnosis present

## 2011-09-10 DIAGNOSIS — Z5331 Laparoscopic surgical procedure converted to open procedure: Secondary | ICD-10-CM

## 2011-09-10 DIAGNOSIS — J449 Chronic obstructive pulmonary disease, unspecified: Secondary | ICD-10-CM | POA: Diagnosis present

## 2011-09-10 DIAGNOSIS — Z85038 Personal history of other malignant neoplasm of large intestine: Secondary | ICD-10-CM

## 2011-09-10 DIAGNOSIS — E876 Hypokalemia: Secondary | ICD-10-CM | POA: Diagnosis not present

## 2011-09-10 DIAGNOSIS — K430 Incisional hernia with obstruction, without gangrene: Secondary | ICD-10-CM

## 2011-09-10 DIAGNOSIS — I4891 Unspecified atrial fibrillation: Secondary | ICD-10-CM | POA: Diagnosis not present

## 2011-09-10 DIAGNOSIS — K5900 Constipation, unspecified: Secondary | ICD-10-CM | POA: Diagnosis not present

## 2011-09-10 DIAGNOSIS — K560 Paralytic ileus: Secondary | ICD-10-CM | POA: Diagnosis not present

## 2011-09-10 DIAGNOSIS — K660 Peritoneal adhesions (postprocedural) (postinfection): Secondary | ICD-10-CM | POA: Diagnosis present

## 2011-09-10 HISTORY — PX: PORT-A-CATH REMOVAL: SHX5289

## 2011-09-10 HISTORY — DX: Cardiac arrhythmia, unspecified: I49.9

## 2011-09-10 HISTORY — PX: VENTRAL HERNIA REPAIR: SHX424

## 2011-09-10 SURGERY — REPAIR, HERNIA, VENTRAL, LAPAROSCOPIC
Anesthesia: General

## 2011-09-10 MED ORDER — NEOSTIGMINE METHYLSULFATE 1 MG/ML IJ SOLN
INTRAMUSCULAR | Status: DC | PRN
  Administered 2011-09-10: 4 mg via INTRAVENOUS

## 2011-09-10 MED ORDER — KETOROLAC TROMETHAMINE 30 MG/ML IJ SOLN
INTRAMUSCULAR | Status: AC
  Filled 2011-09-10: qty 1

## 2011-09-10 MED ORDER — PHENYLEPHRINE HCL 10 MG/ML IJ SOLN
INTRAMUSCULAR | Status: DC | PRN
  Administered 2011-09-10 (×2): 40 ug via INTRAVENOUS

## 2011-09-10 MED ORDER — BUPIVACAINE-EPINEPHRINE (PF) 0.5% -1:200000 IJ SOLN
INTRAMUSCULAR | Status: AC
  Filled 2011-09-10: qty 10

## 2011-09-10 MED ORDER — MEPERIDINE HCL 50 MG/ML IJ SOLN: 6.2500 mg | INTRAMUSCULAR | Status: DC | PRN

## 2011-09-10 MED ORDER — BUDESONIDE-FORMOTEROL FUMARATE 80-4.5 MCG/ACT IN AERO
2.0000 | INHALATION_SPRAY | Freq: Two times a day (BID) | RESPIRATORY_TRACT | Status: DC
  Administered 2011-09-11 – 2011-09-17 (×12): 2 via RESPIRATORY_TRACT
  Filled 2011-09-10: qty 6.9

## 2011-09-10 MED ORDER — HYDROMORPHONE HCL PF 1 MG/ML IJ SOLN
INTRAMUSCULAR | Status: DC | PRN
  Administered 2011-09-10 (×2): 1 mg via INTRAVENOUS

## 2011-09-10 MED ORDER — SODIUM CHLORIDE 0.9 % IJ SOLN
INTRAMUSCULAR | Status: DC | PRN
  Administered 2011-09-10: 19 mL

## 2011-09-10 MED ORDER — ONDANSETRON HCL 4 MG/2ML IJ SOLN
INTRAMUSCULAR | Status: DC | PRN
  Administered 2011-09-10: 4 mg via INTRAVENOUS

## 2011-09-10 MED ORDER — TIOTROPIUM BROMIDE MONOHYDRATE 18 MCG IN CAPS
18.0000 ug | ORAL_CAPSULE | Freq: Every day | RESPIRATORY_TRACT | Status: DC
  Administered 2011-09-12 – 2011-09-17 (×6): 18 ug via RESPIRATORY_TRACT
  Filled 2011-09-10 (×2): qty 5

## 2011-09-10 MED ORDER — SODIUM CHLORIDE 0.9 % IR SOLN
Status: DC | PRN
  Administered 2011-09-10: 1000 mL

## 2011-09-10 MED ORDER — HYDROMORPHONE HCL PF 1 MG/ML IJ SOLN
INTRAMUSCULAR | Status: AC
  Filled 2011-09-10: qty 1

## 2011-09-10 MED ORDER — PROPOFOL 10 MG/ML IV BOLUS
INTRAVENOUS | Status: DC | PRN
  Administered 2011-09-10: 150 mg via INTRAVENOUS

## 2011-09-10 MED ORDER — CITALOPRAM HYDROBROMIDE 40 MG PO TABS
40.0000 mg | ORAL_TABLET | Freq: Every day | ORAL | Status: DC
  Administered 2011-09-11 – 2011-09-16 (×6): 40 mg via ORAL
  Filled 2011-09-10 (×7): qty 1

## 2011-09-10 MED ORDER — BUPIVACAINE-EPINEPHRINE PF 0.5-1:200000 % IJ SOLN
INTRAMUSCULAR | Status: DC | PRN
  Administered 2011-09-10: 6 mL

## 2011-09-10 MED ORDER — ALBUTEROL SULFATE HFA 108 (90 BASE) MCG/ACT IN AERS
2.0000 | INHALATION_SPRAY | Freq: Four times a day (QID) | RESPIRATORY_TRACT | Status: DC | PRN
  Filled 2011-09-10: qty 6.7

## 2011-09-10 MED ORDER — HEPARIN SODIUM (PORCINE) 5000 UNIT/ML IJ SOLN: 5000.0000 [IU] | Freq: Three times a day (TID) | INTRAMUSCULAR | Status: DC

## 2011-09-10 MED ORDER — THERA M PLUS PO TABS
1.0000 | ORAL_TABLET | Freq: Every day | ORAL | Status: DC
  Administered 2011-09-12 – 2011-09-17 (×6): 1 via ORAL
  Filled 2011-09-10 (×8): qty 1

## 2011-09-10 MED ORDER — HYDROMORPHONE 0.3 MG/ML IV SOLN
INTRAVENOUS | Status: DC
  Administered 2011-09-10: 7.5 mg via INTRAVENOUS
  Administered 2011-09-10: 2.7 mg via INTRAVENOUS
  Administered 2011-09-11 (×2): 3 mg via INTRAVENOUS
  Administered 2011-09-11: 2.1 mg via INTRAVENOUS
  Administered 2011-09-11: 22:00:00 via INTRAVENOUS
  Administered 2011-09-11: 4.8 mg via INTRAVENOUS
  Administered 2011-09-11 – 2011-09-12 (×2): via INTRAVENOUS
  Administered 2011-09-12: 1.8 mg via INTRAVENOUS
  Administered 2011-09-12: 3.6 mg via INTRAVENOUS
  Administered 2011-09-12: 2.1 mg via INTRAVENOUS
  Administered 2011-09-12: 19:00:00 via INTRAVENOUS
  Administered 2011-09-12: 2.4 mg via INTRAVENOUS
  Administered 2011-09-13: 12:00:00 via INTRAVENOUS
  Administered 2011-09-13: 1.8 mg via INTRAVENOUS
  Administered 2011-09-13: 0.9 mg via INTRAVENOUS
  Administered 2011-09-13: 3.3 mg via INTRAVENOUS
  Administered 2011-09-13: 2.4 mg via INTRAVENOUS
  Administered 2011-09-14: 1.2 mg via INTRAVENOUS
  Administered 2011-09-14: 15:00:00 via INTRAVENOUS
  Administered 2011-09-14: 2.1 mg via INTRAVENOUS
  Administered 2011-09-14 (×2): 3.3 mg via INTRAVENOUS
  Administered 2011-09-14: 0.6 mg via INTRAVENOUS
  Administered 2011-09-15: 06:00:00 via INTRAVENOUS
  Administered 2011-09-15: 2.4 mg via INTRAVENOUS
  Administered 2011-09-15: 0.3 mg via INTRAVENOUS
  Filled 2011-09-10 (×19): qty 25

## 2011-09-10 MED ORDER — ACETAMINOPHEN 10 MG/ML IV SOLN
INTRAVENOUS | Status: AC
  Filled 2011-09-10: qty 100

## 2011-09-10 MED ORDER — ONDANSETRON HCL 4 MG/2ML IJ SOLN: 4.0000 mg | Freq: Four times a day (QID) | INTRAMUSCULAR | Status: DC | PRN

## 2011-09-10 MED ORDER — LISINOPRIL-HYDROCHLOROTHIAZIDE 20-25 MG PO TABS: 1.0000 | ORAL_TABLET | ORAL | Status: DC

## 2011-09-10 MED ORDER — SODIUM CHLORIDE 0.9 % IJ SOLN: 9.0000 mL | INTRAMUSCULAR | Status: DC | PRN

## 2011-09-10 MED ORDER — LACTATED RINGERS IV SOLN: INTRAVENOUS | Status: DC

## 2011-09-10 MED ORDER — ACETAMINOPHEN 10 MG/ML IV SOLN
INTRAVENOUS | Status: DC | PRN
  Administered 2011-09-10: 1000 mg via INTRAVENOUS

## 2011-09-10 MED ORDER — HEPARIN SODIUM (PORCINE) 5000 UNIT/ML IJ SOLN
5000.0000 [IU] | Freq: Once | INTRAMUSCULAR | Status: AC
  Administered 2011-09-10: 5000 [IU] via SUBCUTANEOUS

## 2011-09-10 MED ORDER — LACTATED RINGERS IV SOLN
INTRAVENOUS | Status: DC
  Administered 2011-09-10: 1000 mL via INTRAVENOUS
  Administered 2011-09-10 (×2): via INTRAVENOUS

## 2011-09-10 MED ORDER — MIDAZOLAM HCL 5 MG/5ML IJ SOLN
INTRAMUSCULAR | Status: DC | PRN
  Administered 2011-09-10: 1 mg via INTRAVENOUS

## 2011-09-10 MED ORDER — DIPHENHYDRAMINE HCL 50 MG/ML IJ SOLN: 12.5000 mg | Freq: Four times a day (QID) | INTRAMUSCULAR | Status: DC | PRN

## 2011-09-10 MED ORDER — SODIUM CHLORIDE 0.9 % IV SOLN
INTRAVENOUS | Status: AC
  Filled 2011-09-10: qty 50

## 2011-09-10 MED ORDER — LACTATED RINGERS IR SOLN
Status: DC | PRN
  Administered 2011-09-10: 1000 mL

## 2011-09-10 MED ORDER — GLYCOPYRROLATE 0.2 MG/ML IJ SOLN
INTRAMUSCULAR | Status: DC | PRN
  Administered 2011-09-10: .7 mg via INTRAVENOUS

## 2011-09-10 MED ORDER — ONDANSETRON HCL 4 MG PO TABS: 4.0000 mg | ORAL_TABLET | Freq: Four times a day (QID) | ORAL | Status: DC | PRN

## 2011-09-10 MED ORDER — DIPHENHYDRAMINE HCL 12.5 MG/5ML PO ELIX
12.5000 mg | ORAL_SOLUTION | Freq: Four times a day (QID) | ORAL | Status: DC | PRN
  Filled 2011-09-10: qty 5

## 2011-09-10 MED ORDER — ALBUTEROL SULFATE (5 MG/ML) 0.5% IN NEBU
2.5000 mg | INHALATION_SOLUTION | Freq: Four times a day (QID) | RESPIRATORY_TRACT | Status: DC
  Administered 2011-09-11 – 2011-09-12 (×3): 2.5 mg via RESPIRATORY_TRACT
  Filled 2011-09-10 (×7): qty 0.5

## 2011-09-10 MED ORDER — HEPARIN SODIUM (PORCINE) 5000 UNIT/ML IJ SOLN
5000.0000 [IU] | Freq: Three times a day (TID) | INTRAMUSCULAR | Status: DC
  Administered 2011-09-11 – 2011-09-17 (×18): 5000 [IU] via SUBCUTANEOUS
  Filled 2011-09-10 (×23): qty 1

## 2011-09-10 MED ORDER — ROCURONIUM BROMIDE 100 MG/10ML IV SOLN
INTRAVENOUS | Status: DC | PRN
  Administered 2011-09-10 (×2): 10 mg via INTRAVENOUS
  Administered 2011-09-10: 20 mg via INTRAVENOUS
  Administered 2011-09-10 (×2): 10 mg via INTRAVENOUS
  Administered 2011-09-10: 20 mg via INTRAVENOUS
  Administered 2011-09-10: 10 mg via INTRAVENOUS
  Administered 2011-09-10: 40 mg via INTRAVENOUS

## 2011-09-10 MED ORDER — BUPIVACAINE LIPOSOME 1.3 % IJ SUSP
INTRAMUSCULAR | Status: DC | PRN
  Administered 2011-09-10: 19 mL

## 2011-09-10 MED ORDER — POTASSIUM CHLORIDE IN NACL 20-0.9 MEQ/L-% IV SOLN
INTRAVENOUS | Status: DC
  Administered 2011-09-11 – 2011-09-16 (×10): via INTRAVENOUS
  Filled 2011-09-10 (×18): qty 1000

## 2011-09-10 MED ORDER — LIDOCAINE HCL (CARDIAC) 20 MG/ML IV SOLN
INTRAVENOUS | Status: DC | PRN
  Administered 2011-09-10: 50 mg via INTRAVENOUS

## 2011-09-10 MED ORDER — PROMETHAZINE HCL 25 MG/ML IJ SOLN: 6.2500 mg | INTRAMUSCULAR | Status: DC | PRN

## 2011-09-10 MED ORDER — NALOXONE HCL 0.4 MG/ML IJ SOLN: 0.4000 mg | INTRAMUSCULAR | Status: DC | PRN

## 2011-09-10 MED ORDER — BUPIVACAINE-EPINEPHRINE PF 0.5-1:200000 % IJ SOLN
INTRAMUSCULAR | Status: DC | PRN
  Administered 2011-09-10: 11 mL

## 2011-09-10 MED ORDER — SODIUM CHLORIDE 0.9 % IV SOLN
1.0000 g | INTRAVENOUS | Status: DC | PRN
  Administered 2011-09-10: 1 g via INTRAVENOUS

## 2011-09-10 MED ORDER — FENTANYL CITRATE 0.05 MG/ML IJ SOLN
INTRAMUSCULAR | Status: DC | PRN
  Administered 2011-09-10: 50 ug via INTRAVENOUS
  Administered 2011-09-10: 100 ug via INTRAVENOUS
  Administered 2011-09-10 (×2): 50 ug via INTRAVENOUS

## 2011-09-10 MED ORDER — HYDROMORPHONE HCL PF 1 MG/ML IJ SOLN
0.2500 mg | INTRAMUSCULAR | Status: DC | PRN
  Administered 2011-09-10 (×4): 0.5 mg via INTRAVENOUS

## 2011-09-10 MED ORDER — BUPIVACAINE LIPOSOME 1.3 % IJ SUSP
20.0000 mL | Freq: Once | INTRAMUSCULAR | Status: DC
  Filled 2011-09-10: qty 20

## 2011-09-10 MED ORDER — HYDROCHLOROTHIAZIDE 25 MG PO TABS
25.0000 mg | ORAL_TABLET | Freq: Every day | ORAL | Status: DC
  Administered 2011-09-11 – 2011-09-14 (×4): 25 mg via ORAL
  Filled 2011-09-10 (×4): qty 1

## 2011-09-10 MED ORDER — KETOROLAC TROMETHAMINE 30 MG/ML IJ SOLN
15.0000 mg | Freq: Once | INTRAMUSCULAR | Status: AC | PRN
  Administered 2011-09-10: 30 mg via INTRAVENOUS

## 2011-09-10 MED ORDER — LISINOPRIL 20 MG PO TABS
20.0000 mg | ORAL_TABLET | Freq: Every day | ORAL | Status: DC
  Administered 2011-09-11 – 2011-09-14 (×4): 20 mg via ORAL
  Filled 2011-09-10 (×4): qty 1

## 2011-09-10 MED ORDER — DEXAMETHASONE SODIUM PHOSPHATE 10 MG/ML IJ SOLN
INTRAMUSCULAR | Status: DC | PRN
  Administered 2011-09-10: 10 mg via INTRAVENOUS

## 2011-09-10 SURGICAL SUPPLY — 74 items
APPLIER CLIP 5 13 M/L LIGAMAX5 (MISCELLANEOUS)
BENZOIN TINCTURE PRP APPL 2/3 (GAUZE/BANDAGES/DRESSINGS) IMPLANT
BINDER ABD UNIV 12 45-62 (WOUND CARE) ×1 IMPLANT
BINDER ABD UNIV 9 30-45 (GAUZE/BANDAGES/DRESSINGS) ×1 IMPLANT
BINDER ABDOMINAL 46IN 62IN (WOUND CARE) ×2
BINDER ABDOMINAL 9 (GAUZE/BANDAGES/DRESSINGS) ×2
BLADE SURG SZ10 CARB STEEL (BLADE) ×2 IMPLANT
CANISTER SUCTION 2500CC (MISCELLANEOUS) IMPLANT
CANNULA ENDOPATH XCEL 11M (ENDOMECHANICALS) IMPLANT
CHLORAPREP W/TINT 10.5 ML (MISCELLANEOUS) ×2 IMPLANT
CLIP APPLIE 5 13 M/L LIGAMAX5 (MISCELLANEOUS) IMPLANT
CLOTH BEACON ORANGE TIMEOUT ST (SAFETY) ×2 IMPLANT
COVER SURGICAL LIGHT HANDLE (MISCELLANEOUS) ×2 IMPLANT
DECANTER SPIKE VIAL GLASS SM (MISCELLANEOUS) ×2 IMPLANT
DERMABOND ADVANCED (GAUZE/BANDAGES/DRESSINGS) ×1
DERMABOND ADVANCED .7 DNX12 (GAUZE/BANDAGES/DRESSINGS) ×1 IMPLANT
DEVICE SECURE STRAP 25 ABSORB (INSTRUMENTS) IMPLANT
DEVICE TROCAR PUNCTURE CLOSURE (ENDOMECHANICALS) ×2 IMPLANT
DISSECTOR BLUNT TIP ENDO 5MM (MISCELLANEOUS) IMPLANT
DRAIN CHANNEL RND F F (WOUND CARE) ×4 IMPLANT
DRAPE LAPAROSCOPIC ABDOMINAL (DRAPES) ×2 IMPLANT
DRAPE LAPAROTOMY TRNSV 102X78 (DRAPE) ×2 IMPLANT
DRSG PAD ABDOMINAL 8X10 ST (GAUZE/BANDAGES/DRESSINGS) ×2 IMPLANT
ELECT CAUTERY BLADE 6.4 (BLADE) ×4 IMPLANT
ELECT REM PT RETURN 9FT ADLT (ELECTROSURGICAL) ×2
ELECTRODE REM PT RTRN 9FT ADLT (ELECTROSURGICAL) ×1 IMPLANT
EVACUATOR DRAINAGE 10X20 100CC (DRAIN) ×2 IMPLANT
EVACUATOR SILICONE 100CC (DRAIN) ×2
GAUZE SPONGE 4X4 16PLY XRAY LF (GAUZE/BANDAGES/DRESSINGS) ×4 IMPLANT
GLOVE BIOGEL PI IND STRL 7.0 (GLOVE) ×1 IMPLANT
GLOVE BIOGEL PI INDICATOR 7.0 (GLOVE) ×1
GLOVE EUDERMIC 7 POWDERFREE (GLOVE) ×6 IMPLANT
GOWN STRL NON-REIN LRG LVL3 (GOWN DISPOSABLE) ×6 IMPLANT
GOWN STRL REIN XL XLG (GOWN DISPOSABLE) ×6 IMPLANT
HAND ACTIVATED (MISCELLANEOUS) ×4 IMPLANT
KIT BASIN OR (CUSTOM PROCEDURE TRAY) ×4 IMPLANT
MESH PHYSIO OVAL 20X25CM (Mesh General) ×2 IMPLANT
NEEDLE HYPO 25X1 1.5 SAFETY (NEEDLE) ×2 IMPLANT
NEEDLE SPNL 22GX3.5 QUINCKE BK (NEEDLE) ×2 IMPLANT
NS IRRIG 1000ML POUR BTL (IV SOLUTION) ×2 IMPLANT
PACK BASIC VI WITH GOWN DISP (CUSTOM PROCEDURE TRAY) ×2 IMPLANT
PEN SKIN MARKING BROAD (MISCELLANEOUS) ×2 IMPLANT
PENCIL BUTTON HOLSTER BLD 10FT (ELECTRODE) ×4 IMPLANT
POUCH SPECIMEN RETRIEVAL 10MM (ENDOMECHANICALS) IMPLANT
SCISSORS LAP 5X35 DISP (ENDOMECHANICALS) ×2 IMPLANT
SET IRRIG TUBING LAPAROSCOPIC (IRRIGATION / IRRIGATOR) ×2 IMPLANT
SOLUTION ANTI FOG 6CC (MISCELLANEOUS) ×2 IMPLANT
SPONGE GAUZE 4X4 12PLY (GAUZE/BANDAGES/DRESSINGS) ×2 IMPLANT
SPONGE LAP 18X18 X RAY DECT (DISPOSABLE) ×4 IMPLANT
STAPLER VISISTAT 35W (STAPLE) ×2 IMPLANT
STRIP CLOSURE SKIN 1/2X4 (GAUZE/BANDAGES/DRESSINGS) IMPLANT
SUCTION POOLE TIP (SUCTIONS) ×2 IMPLANT
SUT ETHILON 3 0 PS 1 (SUTURE) ×4 IMPLANT
SUT MNCRL AB 4-0 PS2 18 (SUTURE) ×4 IMPLANT
SUT NOVA 0 T19/GS 22DT (SUTURE) IMPLANT
SUT NOVA 1 T20/GS 25DT (SUTURE) ×6 IMPLANT
SUT PDS AB 1 TP1 54 (SUTURE) ×4 IMPLANT
SUT SILK 2 0 (SUTURE) ×1
SUT SILK 2-0 18XBRD TIE 12 (SUTURE) ×1 IMPLANT
SUT SILK 3 0 SH CR/8 (SUTURE) ×2 IMPLANT
SUT VIC AB 2-0 SH 18 (SUTURE) ×2 IMPLANT
SUT VIC AB 3-0 SH 27 (SUTURE) ×2
SUT VIC AB 3-0 SH 27XBRD (SUTURE) ×2 IMPLANT
SYR CONTROL 10ML LL (SYRINGE) ×2 IMPLANT
TACKER 5MM HERNIA 3.5CML NAB (ENDOMECHANICALS) IMPLANT
TAPE CLOTH SURG 4X10 WHT LF (GAUZE/BANDAGES/DRESSINGS) ×2 IMPLANT
TOWEL OR 17X26 10 PK STRL BLUE (TOWEL DISPOSABLE) ×6 IMPLANT
TRAY FOLEY CATH 14FRSI W/METER (CATHETERS) ×2 IMPLANT
TRAY LAP CHOLE (CUSTOM PROCEDURE TRAY) ×2 IMPLANT
TROCAR BLADELESS OPT 5 75 (ENDOMECHANICALS) ×10 IMPLANT
TROCAR XCEL BLUNT TIP 100MML (ENDOMECHANICALS) IMPLANT
TROCAR XCEL NON-BLD 11X100MML (ENDOMECHANICALS) ×2 IMPLANT
TUBING INSUFFLATION 10FT LAP (TUBING) ×2 IMPLANT
YANKAUER SUCT BULB TIP 10FT TU (MISCELLANEOUS) ×2 IMPLANT

## 2011-09-10 NOTE — Op Note (Signed)
Patient Name:           Christian Harrison   Date of Surgery:        09/10/2011  Pre op Diagnosis:      Incarcerated ventral incisional hernia. Desires Port-A-Cath removal Post op Diagnosis:    Incarcerated ventral incisional hernia, extensive intra-abdominal adhesions.  Desires Port-A-Cath removal  Procedure:                 Laparoscopic lysis of adhesions requiring one hour. Open ventral hernia repair with inlay Physiomesh.  Removal of Port-A-Cath  Surgeon:                     Angelia Mould. Derrell Lolling, M.D., FACS  Assistant:                      none  Operative Indications:   This is a 65 year old Caucasian gentleman who has a history of a laparoscopic right colectomy for carcinoma of the colon. He had postop adjuvant chemotherapy. He has noted recurrence. He presented recently with a painful partially incarcerated ventral hernia in the extraction site above the umbilicus. He's been evaluated as an outpatient. He would like to have the hernia repaired and he would like to have his Port-A-Cath removed  Operative Findings:       Removal of the Port-A-Cath was uneventful. Intra-abdominal adhesions were much more severe and extensive than anticipated. There were numerous loops of small bowel adherent to the anterior abdominal wall and extending over to the right lateral abdominal wall. The ventral hernia itself was much larger than anticipated, making it a poor circumstance for laparoscopic repair. Because of the degree of adhesions that he had been because of the size of the defect I elected to convert to an open procedure, with the thought that I could manage the adhesions better and more safely, and I can get a more secure repair that would have a better long-term result  Procedure in Detail:          Following the induction of general endotracheal anesthesia, intravenous antibiotics were given. Time out was held. Foley catheter was inserted.  We first prepped of the left upper chest where the Port-A-Cath  was. 0.5% Marcaine with epinephrine was used as local infiltration anesthetic. A transverse incision was made in the left infraclavicular area. Dissection was carried down to the port. The capsule around the port was incised. The port was lifted up and the Prolene sutures were cut and removed. I then dissected the portacath and removed the port and the catheter intact. There was no bleeding. The subcutaneous tissue was closed with 3-0 Vicryl sutures and the skin closed with a running subcuticular suture of 4-0 Monocryl and Dermabond.  We then prepped and draped the abdomen. Another time out was held. A 5 mm optical port was placed in the left subcostal region. The entry was uneventful. Pneumoperitoneum was created. The camera was inserted. I placed 3 more 5 mm ports down the left lower quadrant and lower midline. I spent one hour taking down extensive adhesions all the way from the falciform ligament down through the hernia defects and  below the hernia defects. The extensive small bowel adhesions were difficult to manage. I was able to take some of these down. In the right lower quadrant during the dissection I got into the mesentery and was concerned and so decided that the adhesionsould be too difficult to manage. I went back and looked at  the hernia defect and it looked to be at least 8 centimeters in size which I felt was not suitable for laparoscopic repair and bridging.  The pneumoperitoneum was released. The trocars were removed. Midline incision was made. Dissection was carried down through subcutaneous tissue and into the hernia sac. The hernia sac was debrided. The attenuated fascia was debrided. We entered the abdomen and extended the incision inferiorly somewhat. We then took down all the small bowel adhesions and examine the small bowel from the ligament of Treitz all the way down to the area of the anastomosis in the right upper quadrant. There was no evidence of any bowel injury. There was one  small injury to the mesentery but it was not bleeding. We washed this out with 3-4 L of saline and examine the small bowel about 3 times and found no injury.  We returned the small bowel and omentum to their anatomic positions. I measured the wound. I brought a 25 cm x 20 cm piece of Physio-mesh to the operative field. This was sewed it in place below the fascia with multiple interrupted mattress sutures of #1 Novofil. This was done under direct vision. Probably 30 such sutures were placed and I had the mesh back 5 or 6 cm from the edge of the fascia. There was no defect or space in the repair. We irrigated the wound out. We injected 50% Exparel nto the muscle layers and into the subcutaneous tissues. This was 20 cc of Exparel mixed with 20 cc of saline and we used all of this. Two 68 French Blake drains were placed and brought out through separate stab incisions inferolaterally. Both of these were placed in subcutaneous tissue and I placed the end of one of these through the fascia at the lower end to evacuate fluid from below the fascia and anterior to the mesh. There was no bleeding. The fascia was closed in the midline with a running suture of #1 PDS. Subcutaneous tissue was closed with interrupted 2-0 Vicryl and the skin incisions were closed with skin staples. Clean bandages and a Velcro binder were placed. The patient tolerated the procedure well and was taken to recovery room in stable condition. EBL 200 cc. Counts correct. Complications none.     Angelia Mould. Derrell Lolling, M.D., FACS General and Minimally Invasive Surgery Breast and Colorectal Surgery  09/10/2011 1:26 PM

## 2011-09-10 NOTE — Anesthesia Preprocedure Evaluation (Signed)
Anesthesia Evaluation  Patient identified by MRN, date of birth, ID band Patient awake    Reviewed: Allergy & Precautions, H&P , NPO status , Patient's Chart, lab work & pertinent test results  Airway Mallampati: II TM Distance: >3 FB Neck ROM: Full    Dental No notable dental hx.    Pulmonary neg pulmonary ROS, asthma , COPD clear to auscultation  Pulmonary exam normal       Cardiovascular hypertension, neg cardio ROS Regular Normal    Neuro/Psych Negative Neurological ROS  Negative Psych ROS   GI/Hepatic negative GI ROS, Neg liver ROS,   Endo/Other  Negative Endocrine ROS  Renal/GU negative Renal ROS  Genitourinary negative   Musculoskeletal negative musculoskeletal ROS (+)   Abdominal   Peds negative pediatric ROS (+)  Hematology negative hematology ROS (+)   Anesthesia Other Findings   Reproductive/Obstetrics negative OB ROS                           Anesthesia Physical Anesthesia Plan  ASA: III  Anesthesia Plan: General   Post-op Pain Management:    Induction: Intravenous  Airway Management Planned: Oral ETT  Additional Equipment:   Intra-op Plan:   Post-operative Plan: Extubation in OR  Informed Consent: I have reviewed the patients History and Physical, chart, labs and discussed the procedure including the risks, benefits and alternatives for the proposed anesthesia with the patient or authorized representative who has indicated his/her understanding and acceptance.   Dental advisory given  Plan Discussed with: CRNA  Anesthesia Plan Comments:         Anesthesia Quick Evaluation

## 2011-09-10 NOTE — H&P (View-Only) (Signed)
[name]   08/18/2011 11:00 AM Office Visit  MRN: [id number]   Description: 65 year old male  Provider: [name], MD  Department: [facility]        Diagnoses     Incisional hernia   - Primary    553.21      Reason for Visit     Routine Post Op    recheck hernia and PAC and discuss sx        Vitals - Last Recorded       BP Pulse Temp(Src) Resp Ht Wt    138/90  64  96.2 F (35.7 C) (Temporal)  18  5\' 10"  (1.778 m)  217 lb 2 oz (98.487 kg)          BMI              31.15 kg/m2                 Progress Notes     [name], MD  08/18/2011 11:17 AM  SignedChief Complaint   Patient presents with   .  Routine Post Op       recheck hernia and PAC and discuss sx      HPI Christian Harrison is a 65 y.o. male.     Mr. Chiara states that he saw Dr. Evette Cristal and had a colonoscopy and was told everything is fine. He is here today to schedule repair of his ventral hernia and removal of his Port-A-Cath. He was feels well. There has been no change in his medical condition.   Recall that he underwent right colectomy March 21, 2010 for stage T3,N1 B. carcinoma of the colon. Port-A-Cath was inserted. He has completed his adjuvant chemotherapy and has been released to have this removed by Dr. Mancel Bale.   We talked about the indications and details of laparoscopic repair of his ventral hernia with mesh. He has been told this might be need be converted to open. We talked about removal of Port-A-Cath. We discussed indications risks and complications one more time. All his questions were answered. He is ready to proceed with planning. HPI    Past Medical History   Diagnosis  Date   .  Asthma     .  Hypertension     .  Colon cancer         colon ca dx 02/2010   .  COPD (chronic obstructive pulmonary disease)     .  Hernia         Past Surgical History   Procedure  Date   .  Hand surgery         LEFT   .  Colon surgery         Family History     Problem  Relation  Age of Onset   .  Heart disease  Mother        Social History History   Substance Use Topics   .  Smoking status:  Never Smoker    .  Smokeless tobacco:  Never Used   .  Alcohol Use:  No       Allergies   Allergen  Reactions   .  Penicillins  Anaphylaxis and Other (See Comments)       Seizures       Current Outpatient Prescriptions   Medication  Sig  Dispense  Refill   .  ALBUTEROL IN  Inhale 17.5 mg into the lungs.           Marland Kitchen  budesonide-formoterol (SYMBICORT) 80-4.5 MCG/ACT inhaler  Inhale 2 puffs into the lungs 2 (two) times daily.           .  citalopram (CELEXA) 40 MG tablet  Take 40 mg by mouth daily.           Marland Kitchen  LISINOPRIL-HYDROCHLOROTHIAZIDE PO  Take 20-25 mg by mouth daily.           .  Nebulizer MISC  2.5 mg by Does not apply route as needed.           .  tiotropium (SPIRIVA) 18 MCG inhalation capsule  Place 18 mcg into inhaler and inhale daily.           Marland Kitchen  ZOLPIDEM TARTRATE PO  Take 10 mg by mouth as needed.              Review of Systems Review of Systems  Constitutional: Negative for fever, chills and unexpected weight change.  HENT: Negative for hearing loss, congestion, sore throat, trouble swallowing and voice change.   Eyes: Negative for visual disturbance.  Respiratory: Negative for cough and wheezing.   Cardiovascular: Negative for chest pain, palpitations and leg swelling.  Gastrointestinal: Positive for abdominal pain. Negative for nausea, vomiting, diarrhea, constipation, blood in stool, abdominal distention, anal bleeding and rectal pain.  Genitourinary: Negative for hematuria and difficulty urinating.  Musculoskeletal: Negative for arthralgias.  Skin: Negative for rash and wound.  Neurological: Negative for seizures, syncope, weakness and headaches.  Hematological: Negative for adenopathy. Does not bruise/bleed easily.  Psychiatric/Behavioral: Negative for confusion.    Blood pressure 138/90, pulse 64, temperature 96.2 F  (35.7 C), temperature source Temporal, resp. rate 18, height 5\' 10"  (1.778 m), weight 217 lb 2 oz (98.487 kg).   Physical Exam Physical Exam  Constitutional: He is oriented to person, place, and time. He appears well-developed and well-nourished.  Neck: Normal range of motion. Neck supple. No JVD present. No tracheal deviation present. No thyromegaly present.  Cardiovascular: Normal rate, regular rhythm, normal heart sounds and intact distal pulses.    No murmur heard. Pulmonary/Chest: Breath sounds normal. No respiratory distress. He has no wheezes. He has no rales.       Port left infraclavicular area.  Abdominal: Soft. Bowel sounds are normal. He exhibits mass. He exhibits no distension. There is no tenderness. There is no rebound and no guarding.    Musculoskeletal: Normal range of motion. He exhibits no edema and no tenderness.  Lymphadenopathy:    He has no cervical adenopathy.  Neurological: He is alert and oriented to person, place, and time. He exhibits normal muscle tone. Coordination normal.  Skin: Skin is warm and dry. No rash noted. He is not diaphoretic. No erythema. No pallor.  Psychiatric: He has a normal mood and affect. His behavior is normal. Judgment normal.    Data Reviewed I requested a colonoscopy report from Dr. Evette Cristal. I have requested cardiac and medical clearance with his primary care physician.. I reviewed his old records.   Assessment Ventral incisional hernia, midepigastrium, 4-6 cm defect, reducible, symptomatic.   Desires Port-A-Cath removal   History carcinoma right colon, no evidence of recurrence one year postop. Status post adjuvant chemotherapy   Asthma   Hypertension   Past history antral fibrillation, resolved.      Plan Schedule for laparoscopic repair of ventral incisional hernia with mesh, possible open, and removal of Port-A-Cath.       [employer] M 08/18/2011, 11:08 AM  Not recorded       Pending        Disp Refills Start End    heparin injection 5,000 Units     08/18/2011 08/18/2011    Route:  Subcutaneous    Class:  Normal    vancomycin (VANCOCIN) 1,250 mg in sodium chloride 0.9 % 500 mL IVPB     08/18/2011      Route:  Intravenous    Class:  Normal            Patient Instructions     You will be scheduled for surgery. The surgery will be a laparoscopic repair of your ventral hernia with mesh, possible conversion to open surgery. We will also remove your Port-A-Cath at the same time.       Level of Service     PR OFFICE/OUTPT VISIT,EST,LEVL III [id number]         All Flowsheet Templates (all recorded)     Encounter Vitals Flowsheet    Custom Formula Data Flowsheet    Anthropometrics Flowsheet               Referring Provider          Kaleen Mask       All Charges for This Encounter       Code Description Service Date Service Provider Modifiers Quantity    99213 PR OFFICE/OUTPT VISIT,EST,LEVL III 08/18/2011 [name], MD   1        Other Encounter Related Information     Allergies & Medications         Problem List         History         Patient-Entered Questionnaires     No data filed

## 2011-09-10 NOTE — Interval H&P Note (Signed)
History and Physical Interval Note:  09/10/2011 9:17 AM  Christian Harrison  has presented today for surgery, with the diagnosis of ventral/incisional hernia, colon cancer  The various methods of treatment have been discussed with the patient and family. After consideration of goals of treatment, risks, benefits and other options for treatment, the patient has consented to  Procedure(s): LAPAROSCOPIC VENTRAL HERNIA REPAIR WITH MESH and REMOVAL PORT-A-CATH as a surgical intervention .  The patients' history has been reviewed today , patient examined today, there is no change in status, stable for surgery.  I have reviewed the patients' chart and labs.  Questions were answered to the patient's satisfaction.     [name] 09/10/2011 9:18 AM

## 2011-09-10 NOTE — Progress Notes (Signed)
Patient did not use any pca dilaudid

## 2011-09-10 NOTE — Transfer of Care (Signed)
Immediate Anesthesia Transfer of Care Note  Patient: Christian Harrison  Procedure(s) Performed:  LAPAROSCOPIC VENTRAL HERNIA - Lysis of Adhessions, Converted to open, with Physio Mesh; REMOVAL PORT-A-CATH - Removal Port-a-Cath  Patient Location: PACU  Anesthesia Type: General  Level of Consciousness: sedated, patient cooperative and responds to stimulaton  Airway & Oxygen Therapy: Patient Spontanous Breathing and Patient connected to face mask oxgen  Post-op Assessment: Report given to PACU RN and Post -op Vital signs reviewed and stable  Post vital signs: Reviewed and stable  Complications: No apparent anesthesia complications

## 2011-09-10 NOTE — Anesthesia Postprocedure Evaluation (Signed)
  Anesthesia Post-op Note  Patient: Christian Harrison  Procedure(s) Performed:  LAPAROSCOPIC VENTRAL HERNIA - Lysis of Adhessions, Converted to open, with Physio Mesh; REMOVAL PORT-A-CATH - Removal Port-a-Cath  Patient Location: PACU  Anesthesia Type: General  Level of Consciousness: awake and alert   Airway and Oxygen Therapy: Patient Spontanous Breathing  Post-op Pain: mild  Post-op Assessment: Post-op Vital signs reviewed, Patient's Cardiovascular Status Stable, Respiratory Function Stable, Patent Airway and No signs of Nausea or vomiting  Post-op Vital Signs: stable  Complications: No apparent anesthesia complications

## 2011-09-11 LAB — BASIC METABOLIC PANEL
BUN: 26 mg/dL — ABNORMAL HIGH (ref 6–23)
CO2: 24 mEq/L (ref 19–32)
Glucose, Bld: 127 mg/dL — ABNORMAL HIGH (ref 70–99)
Potassium: 4.5 mEq/L (ref 3.5–5.1)
Sodium: 139 mEq/L (ref 135–145)

## 2011-09-11 LAB — CBC
Hemoglobin: 13.6 g/dL (ref 13.0–17.0)
MCH: 32.9 pg (ref 26.0–34.0)
MCHC: 35.1 g/dL (ref 30.0–36.0)
MCV: 93.7 fL (ref 78.0–100.0)
RBC: 4.13 MIL/uL — ABNORMAL LOW (ref 4.22–5.81)

## 2011-09-11 MED ORDER — BIOTENE DRY MOUTH MT LIQD
15.0000 mL | Freq: Two times a day (BID) | OROMUCOSAL | Status: DC
  Administered 2011-09-11 – 2011-09-16 (×11): 15 mL via OROMUCOSAL

## 2011-09-11 MED ORDER — ZOLPIDEM TARTRATE 5 MG PO TABS
5.0000 mg | ORAL_TABLET | Freq: Every evening | ORAL | Status: DC | PRN
  Administered 2011-09-12 – 2011-09-16 (×5): 5 mg via ORAL
  Filled 2011-09-11 (×5): qty 1

## 2011-09-11 MED ORDER — VANCOMYCIN HCL IN DEXTROSE 1-5 GM/200ML-% IV SOLN
1000.0000 mg | Freq: Once | INTRAVENOUS | Status: AC
  Administered 2011-09-11: 1000 mg via INTRAVENOUS
  Filled 2011-09-11: qty 200

## 2011-09-11 MED ORDER — CHLORHEXIDINE GLUCONATE 0.12 % MT SOLN
15.0000 mL | Freq: Two times a day (BID) | OROMUCOSAL | Status: DC
  Administered 2011-09-12 – 2011-09-17 (×10): 15 mL via OROMUCOSAL
  Filled 2011-09-11 (×14): qty 15

## 2011-09-11 NOTE — Progress Notes (Signed)
Pt refused to take Respiratory meds this am he stated he was in pain and could not take a beep breath.

## 2011-09-11 NOTE — Progress Notes (Signed)
1 Day Post-Op  Subjective: Awake and stable. Says  He is sleepy. Wants [place] to restart the Ambien which he is dependent on for sleep at night. Says his pain is under pretty good control with the PCA. Denies nausea. Vital signs stable. Good urine output. Lab work looks okay.  Objective: Vital signs in last 24 hours: Temp:  [96.9 F (36.1 C)-98.6 F (37 C)] 97.5 F (36.4 C) (12/06 0600) Pulse Rate:  [77-101] 83  (12/06 0600) Resp:  [10-20] 18  (12/06 0600) BP: (98-132)/(60-86) 122/76 mmHg (12/06 0600) SpO2:  [93 %-100 %] 97 % (12/06 0600) Weight:  [217 lb (98.431 kg)] 217 lb (98.431 kg) (12/06 0500) Last BM Date: 09/09/11  Intake/Output from previous day: 12/05 0701 - 12/06 0700 In: 4518 [I.V.:4468; IV Piggyback:50] Out: 1580 [Urine:1250; Drains:230; Blood:100] Intake/Output this shift: Total I/O In: 1568 [I.V.:1568] Out: 1120 [Urine:950; Drains:170]  Resp: clear to auscultation bilaterally GI: abdomen is appropriately tender. Incision is clean and without bleeding. No bowel sounds. JP drainage moderate, thin, serosanguineous.  Lab Results:  Results for orders placed during the hospital encounter of 09/10/11 (from the past 24 hour(s))  BASIC METABOLIC PANEL     Status: Abnormal   Collection Time   09/11/11  3:45 AM      Component Value Range   Sodium 139  135 - 145 (mEq/L)   Potassium 4.5  3.5 - 5.1 (mEq/L)   Chloride 107  96 - 112 (mEq/L)   CO2 24  19 - 32 (mEq/L)   Glucose, Bld 127 (*) 70 - 99 (mg/dL)   BUN 26 (*) 6 - 23 (mg/dL)   Creatinine, Ser 0.96  0.50 - 1.35 (mg/dL)   Calcium 8.2 (*) 8.4 - 10.5 (mg/dL)   GFR calc non Af Amer 85 (*) >90 (mL/min)   GFR calc Af Amer >90  >90 (mL/min)  CBC     Status: Abnormal   Collection Time   09/11/11  3:45 AM      Component Value Range   WBC 16.1 (*) 4.0 - 10.5 (K/uL)   RBC 4.13 (*) 4.22 - 5.81 (MIL/uL)   Hemoglobin 13.6  13.0 - 17.0 (g/dL)   HCT 04.5 (*) 40.9 - 52.0 (%)   MCV 93.7  78.0 - 100.0 (fL)   MCH 32.9  26.0 - 34.0  (pg)   MCHC 35.1  30.0 - 36.0 (g/dL)   RDW 81.1  91.4 - 78.2 (%)   Platelets 237  150 - 400 (K/uL)     Studies/Results: @RISRSLT24 @     . albuterol  2.5 mg Nebulization Q6H  . budesonide-formoterol  2 puff Inhalation BID  . citalopram  40 mg Oral Daily  . heparin  5,000 Units Subcutaneous Once  . heparin subcutaneous  5,000 Units Subcutaneous Q8H  . lisinopril  20 mg Oral Daily   And  . hydrochlorothiazide  25 mg Oral Daily  . HYDROmorphone      . HYDROmorphone      . HYDROmorphone PCA 0.3 mg/mL   Intravenous Q4H  . ketorolac      . multivitamins ther. w/minerals  1 tablet Oral Daily  . tiotropium  18 mcg Inhalation Daily  . vancomycin  1,250 mg Intravenous 120 min pre-op  . DISCONTD: bupivacaine liposome  20 mL Infiltration Once  . DISCONTD: heparin  5,000 Units Subcutaneous Q8H  . DISCONTD: lisinopril-hydrochlorothiazide  1 tablet Oral Q0700     Assessment/Plan: s/p Procedure(s): LAPAROSCOPIC LYSIS OF ADHESIONS, OPEN VENTRAL HERNIA REPAIR WITH MESH,  REMOVAL PORT-A-CATH  Stable. Anticipate ileus due to the extent of adhesion lysis. We'll keep n.p.o. Except ice chips and meds.  DC Foley this morning.  Mobilize out of bed.  Asthma:           Continue inhalation therapy.  Hypertension:   Continue oral medication.  Will allow reduced dose Ambien for sleep at night. We'll continue PCA Dilaudid.  Patient Active Hospital Problem List: No active hospital problems.   LOS: 1 day    Garion Wempe M 09/11/2011  . .prob

## 2011-09-11 NOTE — Progress Notes (Signed)
Patient has had a minimal amount of output today,around 450cc voided in small incriments. RN scanned patients bladder and got a result of 225cc. RN explained to patient and wife,that he may need to be in and out cathed.patient refused at this time and stated he would keep trying on his own. Patient stated he had no feelings of pressure or fullness in his bladder.will continue to monitor. CLum Keas

## 2011-09-11 NOTE — Progress Notes (Signed)
Charted by mistake

## 2011-09-11 NOTE — Progress Notes (Signed)
Pt verbalizes understanding of how to correctly use Incentive Spirometer and states has no further questions regarding its correct use. States that he "knows how to use it" . RN did not witness pt's use of the IS, but wife states that he used it 10 times during the past 1 hour. Marcelino Duster, RN

## 2011-09-12 ENCOUNTER — Encounter (HOSPITAL_COMMUNITY): Payer: Self-pay | Admitting: General Surgery

## 2011-09-12 NOTE — Progress Notes (Signed)
2 Days Post-Op  Subjective: Awake and alert. He is sore. He has ambulated once but is poorly motivated. Says he is voiding okay. Has occasional spasms of severe pain. Denies nausea or vomiting.  Objective: Vital signs in last 24 hours: Temp:  [98.3 F (36.8 C)-99.4 F (37.4 C)] 98.9 F (37.2 C) (12/07 0600) Pulse Rate:  [88-98] 97  (12/07 0600) Resp:  [16-20] 18  (12/07 0600) BP: (110-128)/(63-79) 110/73 mmHg (12/07 0600) SpO2:  [90 %-93 %] 93 % (12/07 0600) Last BM Date: 09/09/11  Intake/Output from previous day: 12/06 0701 - 12/07 0700 In: 1852 [I.V.:1852] Out: 1435 [Urine:1325; Drains:110] Intake/Output this shift:    General appearance: alert Resp: lungs are clear to auscultation bilaterally, decreased breath sounds at bases, poor inspiratory effort. GI: abdomen is diffusely sore. Few bowel sounds but some are present. Wound is clean. The JP drainage thin, serosanguineous, moderate volumes  Lab Results:  No results found for this or any previous visit (from the past 24 hour(s)).   Studies/Results: @RISRSLT24 @     . albuterol  2.5 mg Nebulization Q6H  . antiseptic oral rinse  15 mL Mouth Rinse q12n4p  . budesonide-formoterol  2 puff Inhalation BID  . chlorhexidine  15 mL Mouth Rinse BID  . citalopram  40 mg Oral Daily  . heparin subcutaneous  5,000 Units Subcutaneous Q8H  . lisinopril  20 mg Oral Daily   And  . hydrochlorothiazide  25 mg Oral Daily  . HYDROmorphone PCA 0.3 mg/mL   Intravenous Q4H  . multivitamins ther. w/minerals  1 tablet Oral Daily  . tiotropium  18 mcg Inhalation Daily  . vancomycin  1,000 mg Intravenous Once     Assessment/Plan: s/p Procedure(s): LAPAROSCOPIC LYSIS OF ADHESIONS, OPEN VENTRAL HERNIA REPAIR WITH MESH REMOVAL PORT-A-CATH  Postop he is stable, but I expect GI function will be slow to return due to the extensive  lysis of adhesions. Will allow sips of clear liquids and nothing further.  Asthma and COPD. Former smoker. He  is on his inhalers. We will push incentive spirometry and  get him ambulating more to get his lungs clear.  Hypertension: Good control on current medications.  Check lab work tomorrow.    Patient Active Hospital Problem List: No active hospital problems.   LOS: 2 days    Aseel Truxillo M 09/12/2011 7:07 AM   . .prob

## 2011-09-13 ENCOUNTER — Other Ambulatory Visit: Payer: Self-pay

## 2011-09-13 ENCOUNTER — Encounter (HOSPITAL_COMMUNITY): Payer: Self-pay | Admitting: Cardiology

## 2011-09-13 DIAGNOSIS — I4891 Unspecified atrial fibrillation: Secondary | ICD-10-CM | POA: Diagnosis not present

## 2011-09-13 DIAGNOSIS — K430 Incisional hernia with obstruction, without gangrene: Principal | ICD-10-CM | POA: Diagnosis present

## 2011-09-13 HISTORY — DX: Incisional hernia with obstruction, without gangrene: K43.0

## 2011-09-13 LAB — CBC
HCT: 36.6 % — ABNORMAL LOW (ref 39.0–52.0)
MCH: 32.1 pg (ref 26.0–34.0)
MCHC: 34.2 g/dL (ref 30.0–36.0)
MCV: 94.1 fL (ref 78.0–100.0)
Platelets: 196 10*3/uL (ref 150–400)
RDW: 12.9 % (ref 11.5–15.5)

## 2011-09-13 LAB — BASIC METABOLIC PANEL
BUN: 12 mg/dL (ref 6–23)
BUN: 9 mg/dL (ref 6–23)
Calcium: 8.8 mg/dL (ref 8.4–10.5)
Calcium: 9 mg/dL (ref 8.4–10.5)
Creatinine, Ser: 0.68 mg/dL (ref 0.50–1.35)
Creatinine, Ser: 0.67 mg/dL (ref 0.50–1.35)
GFR calc Af Amer: >90 mL/min (ref >90)
GFR calc Af Amer: >90 mL/min (ref >90)
GFR calc non Af Amer: >90 mL/min (ref >90)
GFR calc non Af Amer: >90 mL/min (ref >90)
Potassium: 3.3 mEq/L — ABNORMAL LOW (ref 3.5–5.1)

## 2011-09-13 MED ORDER — DILTIAZEM HCL 100 MG IV SOLR
5.0000 mg/h | INTRAVENOUS | Status: DC
  Filled 2011-09-13: qty 100

## 2011-09-13 MED ORDER — DILTIAZEM LOAD VIA INFUSION
10.0000 mg | Freq: Once | INTRAVENOUS | Status: DC
  Filled 2011-09-13: qty 10

## 2011-09-13 MED ORDER — POTASSIUM CHLORIDE 10 MEQ/100ML IV SOLN
10.0000 meq | Freq: Once | INTRAVENOUS | Status: AC
  Administered 2011-09-13: 10 meq via INTRAVENOUS
  Filled 2011-09-13 (×2): qty 100

## 2011-09-13 MED ORDER — ALBUTEROL SULFATE (5 MG/ML) 0.5% IN NEBU: 2.5000 mg | INHALATION_SOLUTION | Freq: Four times a day (QID) | RESPIRATORY_TRACT | Status: DC | PRN

## 2011-09-13 MED ORDER — POTASSIUM CHLORIDE 10 MEQ/100ML IV SOLN
10.0000 meq | Freq: Once | INTRAVENOUS | Status: AC
  Administered 2011-09-13: 10 meq via INTRAVENOUS
  Filled 2011-09-13: qty 100

## 2011-09-13 MED ORDER — METOPROLOL TARTRATE 1 MG/ML IV SOLN
2.5000 mg | Freq: Four times a day (QID) | INTRAVENOUS | Status: DC
  Administered 2011-09-13 – 2011-09-14 (×4): 2.5 mg via INTRAVENOUS
  Filled 2011-09-13 (×7): qty 5

## 2011-09-13 NOTE — Progress Notes (Signed)
Patient ID: Christian Harrison, male   DOB: October 12, 1945, 65 y.o.   MRN: [id number] 3 Days Post-Op  Subjective: Patient felt very weak and went up walking today. Found to have a heart rate in the 170s. He's been having some moderate episodic abdominal pain. No nausea.  Objective: Vital signs in last 24 hours: Temp:  [98.4 F (36.9 C)-99 F (37.2 C)] 98.4 F (36.9 C) (12/08 0600) Pulse Rate:  [94-100] 98  (12/08 0600)  Currently heart rate is 120-160 and irregular Resp:  [16-20] 18  (12/08 0600) BP: (134-141)/(74-81) 138/80 mmHg (12/08 0600) SpO2:  [92 %-95 %] 94 % (12/08 0600) FiO2 (%):  [3 %] 3 % (12/08 0600) Last BM Date: 09/10/11  Intake/Output from previous day: 12/07 0701 - 12/08 0700 In: 3781.3 [P.O.:1160; I.V.:2621.3] Out: 3875 [Urine:3675; Drains:200] Intake/Output this shift:    General appearance: alert, cooperative and mild distress Resp: clear to auscultation bilaterally Cardio: irregularly irregular rhythm GI: Mild diffuse tenderness Incision/Wound:Dressed and clean  Lab Results:   Basename 09/13/11 0336 09/11/11 0345  WBC 10.9* 16.1*  HGB 12.5* 13.6  HCT 36.6* 38.7*  PLT 196 237   BMET  Basename 09/13/11 0336 09/11/11 0345  NA 136 139  K 3.1* 4.5  CL 101 107  CO2 27 24  GLUCOSE 111* 127*  BUN 12 26*  CREATININE 0.68 0.94  CALCIUM 8.8 8.2*   PT/INR No results found for this basename: LABPROT:2,INR:2 in the last 72 hours ABG No results found for this basename: PHART:2,PCO2:2,PO2:2,HCO3:2 in the last 72 hours  Studies/Results: No results found.  Anti-infectives: Anti-infectives     Start     Dose/Rate Route Frequency Ordered Stop   09/11/11 1200   vancomycin (VANCOCIN) IVPB 1000 mg/200 mL premix        1,000 mg 200 mL/hr over 60 Minutes Intravenous  Once 09/11/11 0624 09/11/11 1343   09/09/11 2045   vancomycin (VANCOCIN) 1,250 mg in sodium chloride 0.9 % 250 mL IVPB        1,250 mg 166.7 mL/hr over 90 Minutes Intravenous 120 min pre-op 09/09/11  2038 09/10/11 0939          Assessment/Plan: s/p Procedure(s): LAPAROSCOPIC VENTRAL HERNIA REMOVAL PORT-A-CATH New onset of atrial fibrillation with rapid response. Hypokalemia. No specific abdominal surgical problems. The patient will be transferred to telemetry. Cardiology has been called.   LOS: 3 days    Jeyla Bulger T 09/13/2011

## 2011-09-13 NOTE — Consult Note (Signed)
Admit date: 09/10/2011 Referring Physician  Dr. Derrell Lolling Primary Cardiologist  Dr. Armanda Magic Reason for Consultation  atrial fibrillation with rapid ventricular response  HPI: This is a 65 year old male who presented on 09/10/2011 after being diagnosed with a ventral/incisional hernia after surgery for colon CA. He had colon resection in June of 2011 for: CA complicated postop by partial small bowel obstruction and atrial fibrillation with rapid ventricular response.  He was and on 09/10/2011 and underwent laparoscopic ventral hernia repair with mesh and removal of Port-A-Cath.  He did well postoperatively and his diet has been advanced slowly to clear liquids. This morning after getting up to walk he was found to have a heart rate 170 beats per minute. He was noted to be in atrial fibrillation with rapid ventricular response. He was transferred to a telemetry bed we are now asked to consult for further evaluation. He was started on Cardizem 10 mg IV bolus and a drip at 5 g per hour was started but has not been given yet. The patient denies any chest pain or shortness of breath.     PMH:   Past Medical History  Diagnosis Date  . Hypertension   . Hernia   . Asthma 09-08-11    controlled with inhalers  . Colon cancer 09-08-11    colon ca dx 02/2010. Chemo x 12 sessions  . COPD (chronic obstructive pulmonary disease) 09-08-11    previous smoker  . Dysrhythmia 03/2010    PAF after colon resection in the setting of partial SBO -     PSH:   Past Surgical History  Procedure Date  . Hand surgery     LEFT  . Colon surgery 09-08-11    surgery in 6'11  . Dg finger*l* 09-08-11    left index finger-tendon release  . Ventral hernia repair 09/10/2011    Procedure: LAPAROSCOPIC VENTRAL HERNIA;  Surgeon: [name], MD;  Location: [facility];  Service: General;  Laterality: N/A;  Lysis of Adhessions, Converted to open, with Physio Mesh  . Port-a-cath removal 09/10/2011    Procedure: REMOVAL PORT-A-CATH;   Surgeon: [name], MD;  Location: [facility];  Service: General;  Laterality: N/A;  Removal Port-a-Cath    Allergies:  Penicillins Prior to Admit Meds:   Prescriptions prior to admission  Medication Sig Dispense Refill  . albuterol (PROVENTIL HFA;VENTOLIN HFA) 108 (90 BASE) MCG/ACT inhaler Inhale 2 puffs into the lungs every 6 (six) hours as needed. For shortness of breath       . albuterol (PROVENTIL) (2.5 MG/3ML) 0.083% nebulizer solution Take 2.5 mg by nebulization every 6 (six) hours as needed. For shortness of breath       . budesonide-formoterol (SYMBICORT) 80-4.5 MCG/ACT inhaler Inhale 2 puffs into the lungs 2 (two) times daily.        . citalopram (CELEXA) 40 MG tablet Take 40 mg by mouth every morning.       Marland Kitchen lisinopril-hydrochlorothiazide (PRINZIDE,ZESTORETIC) 20-25 MG per tablet Take 1 tablet by mouth every morning.        . Multiple Vitamins-Minerals (MULTIVITAMINS THER. W/MINERALS) TABS Take 1 tablet by mouth daily.        Marland Kitchen tiotropium (SPIRIVA) 18 MCG inhalation capsule Place 18 mcg into inhaler and inhale daily.        Marland Kitchen zolpidem (AMBIEN) 10 MG tablet Take 10 mg by mouth at bedtime as needed. For sleep        Fam HX:    Family History  Problem  Relation Age of Onset  . Heart disease Mother    Social HX:    History   Social History  . Marital Status: Married    Spouse Name: N/A    Number of Children: N/A  . Years of Education: N/A   Occupational History  . Not on file.   Social History Main Topics  . Smoking status: Former Smoker -- 1 years  . Smokeless tobacco: Never Used  . Alcohol Use: No  . Drug Use: No  . Sexually Active: Yes   Other Topics Concern  . Not on file   Social History Narrative  . No narrative on file     ROS:  All 11 ROS were addressed and are negative except what is stated in the HPI  Physical Exam: Blood pressure 146/94, pulse 155, temperature 98.4 F (36.9 C), temperature source Oral, resp. rate 26, height 5\' 10"  (1.778 m),  weight 98.431 kg (217 lb), SpO2 94.00%.    General: Well developed, well nourished, in no acute distress Head: Eyes PERRLA, No xanthomas.   Normal cephalic and atramatic  Lungs:   Clear bilaterally to auscultation and percussion. Heart:   HRRR S1 S2 Pulses are 2+ & equal.            No carotid bruit. No JVD.  No abdominal bruits. No femoral bruits. Abdomen:  Mildly tender Extremities:   No clubbing, cyanosis or edema.  DP +1 Neuro: Alert and oriented X 3. Psych:  Good affect, responds appropriately    Labs:   Lab Results  Component Value Date   WBC 10.9* 09/13/2011   HGB 12.5* 09/13/2011   HCT 36.6* 09/13/2011   MCV 94.1 09/13/2011   PLT 196 09/13/2011    Lab 09/13/11 0336  NA 136  K 3.1*  CL 101  CO2 27  BUN 12  CREATININE 0.68  CALCIUM 8.8  PROT --  BILITOT --  ALKPHOS --  ALT --  AST --  GLUCOSE 111*   No results found for this basename: PTT   Lab Results  Component Value Date   INR 1.05 04/22/2010   INR 1.09 03/28/2010   No results found for this basename: CKTOTAL, CKMB, CKMBINDEX, TROPONINI     Lab Results  Component Value Date   CHOL  Value: 97        ATP III CLASSIFICATION:  <200     mg/dL   Desirable  784-696  mg/dL   Borderline High  >=295    mg/dL   High        2/84/1324   No results found for this basename: HDL   No results found for this basename: [facility]   Lab Results  Component Value Date   TRIG 88 04/02/2010       Radiology:  No results found.  EKG:  Currently NSR  ASSESSMENT:  1. New onset atrial fibrillation with rapid ventricular response now converted back to normal sinus rhythm. 2. Hypokalemia 3. Status post ventral hernia repair 4. History colon CA status post colectomy remotely 5. Hypertension controlled 6. COPD  PLAN:   1. Replete potassium 2. Check BMET in AM 3. Will DC IV Cardizem drip 4. Lopressor 2.5 mg IV every 6 hours until taking adequate PO intake, then will change to Lopressor PO  Quintella Reichert, MD  09/13/2011    10:09 AM

## 2011-09-13 NOTE — Significant Event (Signed)
Rapid Response Event Note  Overview: Time Called: 0720 Arrival Time: 0723 Event Type: Cardiac  Initial Focused Assessment:   Interventions:   Event Summary:   at   Called to rm 1526 for elevated HR. Pt had just walked in hall and was back in bed.  Monitor placed and pt is now in rapid at fib rate 150's-160's.  Pt denies pain or SOB. Skin is diaphoretic.  BP 146/ 94 HR 155 R 26 sat 97 on 2L O2.  Lungs sound essentially clear, pt had also just had a breathing Rx.  Dr Johna Sheriff called, will call cards and transfer pt to tele bed.  EKG was done that read at fib.  0740 BP 127 84 HR 115 R 28.   at          Natasha Bence

## 2011-09-13 NOTE — Significant Event (Deleted)
Rapid Response Event Note  Overview: Time Called: 0720 Arrival Time: 0723 Event Type: Cardiac  Initial Focused Assessment:   Interventions:   Event Summary: Name of Physician Notified: Dr Johna Sheriff at 0730    at       Event End Time: 0830  Natasha Bence

## 2011-09-13 NOTE — Significant Event (Deleted)
Rapid Response Event Note  Overview: Time Called: 0720 Arrival Time: 0723 Event Type: Cardiac  Initial Focused Assessment: Pt in rapid at fib HR 150-160's  Denies pain, skin sl diaphoretic. Pt had just been up and walked in hall, and had also just had a resp tx.  Interventions: EKG done, remained on monitor until transferred to tele bed.  Event Summary: Name of Physician Notified: Dr Johna Sheriff at 0730           Event End Time: 0830  Natasha Bence

## 2011-09-14 ENCOUNTER — Other Ambulatory Visit: Payer: Self-pay

## 2011-09-14 DIAGNOSIS — I4891 Unspecified atrial fibrillation: Secondary | ICD-10-CM

## 2011-09-14 LAB — BASIC METABOLIC PANEL
Calcium: 9.6 mg/dL (ref 8.4–10.5)
Chloride: 94 mEq/L — ABNORMAL LOW (ref 96–112)
Creatinine, Ser: 0.7 mg/dL (ref 0.50–1.35)
GFR calc Af Amer: >90 mL/min (ref >90)
GFR calc non Af Amer: >90 mL/min (ref >90)

## 2011-09-14 MED ORDER — METOPROLOL TARTRATE 1 MG/ML IV SOLN
INTRAVENOUS | Status: AC
  Filled 2011-09-14: qty 5

## 2011-09-14 MED ORDER — METOPROLOL TARTRATE 1 MG/ML IV SOLN
2.5000 mg | Freq: Once | INTRAVENOUS | Status: AC
  Administered 2011-09-14: 2.5 mg via INTRAVENOUS

## 2011-09-14 MED ORDER — HYDROCODONE-ACETAMINOPHEN 5-325 MG PO TABS: 1.0000 | ORAL_TABLET | ORAL | Status: DC | PRN

## 2011-09-14 MED ORDER — CARVEDILOL 6.25 MG PO TABS
6.2500 mg | ORAL_TABLET | Freq: Two times a day (BID) | ORAL | Status: DC
  Administered 2011-09-14 – 2011-09-17 (×6): 6.25 mg via ORAL
  Filled 2011-09-14 (×7): qty 1

## 2011-09-14 NOTE — Progress Notes (Signed)
Patient ID: Christian Harrison, male   DOB: 1946-05-09, 65 y.o.   MRN: [id number]  General Surgery - [facility] - Progress Note  POD# 4  Subjective: Pt in bed, wife at bedside.  No complaints.  Denies pain.  No nausea.  Tolerating clear liquid diet.  Objective: Vital signs in last 24 hours: Temp:  [97.6 F (36.4 C)-98.9 F (37.2 C)] 98.2 F (36.8 C) (12/09 0513) Pulse Rate:  [99-107] 107  (12/09 0513) Resp:  [18-22] 20  (12/09 0513) BP: (144-147)/(65-94) 147/94 mmHg (12/09 0513) SpO2:  [94 %-100 %] 98 % (12/09 0855) Last BM Date: 09/10/11  Intake/Output from previous day: 12/08 0701 - 12/09 0700 In: 3100 [P.O.:600; I.V.:2500] Out: 3111 [Urine:3000; Drains:111]  Exam: HEENT - clear, not icteric Neck - soft without mass Chest - clear bilaterally Cor - RRR, no murmur Abd - soft, binder on; drsg dry and intact; JP's with serous output; BS present; positive flatus; no BM Ext - no significant edema Neuro - grossly intact, no focal deficits  Lab Results:   [facility] 09/13/11 0336  WBC 10.9*  HGB 12.5*  HCT 36.6*  PLT 196     Basename 09/14/11 0516 09/13/11 1454  NA 136 137  K 3.1* 3.3*  CL 94* 100  CO2 29 27  GLUCOSE 98 104*  BUN 9 9  CREATININE 0.70 0.67  CALCIUM 9.6 9.0    Studies/Results: No results found.  Assessment: S/P ventral incisional hernia repair with mesh, port removal  Plan: Advance to full liquid diet Begin po pain Rx OOB, ambulate Decrease IVF rate   Velora Heckler, MD, FACS General & Endocrine Surgery [facility]  09/14/2011

## 2011-09-14 NOTE — Progress Notes (Signed)
Patient ID: Christian Harrison, male   DOB: 1946-03-01, 65 y.o.   MRN: [id number] @ Subjective:  Denies SSCP, palpitations or Dyspnea Some incisional pain  Diaphoretic this am with "green sputum"  Objective:  Filed Vitals:   09/13/11 2117 09/13/11 2328 09/14/11 0400 09/14/11 0513  BP: 145/65   147/94  Pulse:    107  Temp: 98.9 F (37.2 C)   98.2 F (36.8 C)  TempSrc: Oral   Oral  Resp: 20 18 18 20   Height:      Weight:      SpO2: 100% 94% 95% 100%    Intake/Output from previous day:  Intake/Output Summary (Last 24 hours) at 09/14/11 0842 Last data filed at 09/14/11 0700  Gross per 24 hour  Intake   3100 ml  Output   3111 ml  Net    -11 ml    Physical Exam: General appearance: alert and no distress Lungs: clear to auscultation bilaterally Heart: regular rate and rhythm, S1, S2 normal, no murmur, click, rub or gallop Abdomen: S/P hernia surgery with positive BS and mild pain to palpation Extremities: extremities normal, atraumatic, no cyanosis or edema Pulses: 2+ and symmetric Neurologic: Grossly normal  Lab Results: Basic Metabolic Panel:  Basename 09/14/11 0516 09/13/11 1454  NA 136 137  K 3.1* 3.3*  CL 94* 100  CO2 29 27  GLUCOSE 98 104*  BUN 9 9  CREATININE 0.70 0.67  CALCIUM 9.6 9.0  MG -- --  PHOS -- --    Basename 09/13/11 0336  WBC 10.9*  NEUTROABS --  HGB 12.5*  HCT 36.6*  MCV 94.1  PLT 196     Telemetry:  NSR with no afib  Medications:      . antiseptic oral rinse  15 mL Mouth Rinse q12n4p  . budesonide-formoterol  2 puff Inhalation BID  . carvedilol  6.25 mg Oral BID WC  . chlorhexidine  15 mL Mouth Rinse BID  . citalopram  40 mg Oral Daily  . diltiazem  10 mg Intravenous Once  . heparin subcutaneous  5,000 Units Subcutaneous Q8H  . lisinopril  20 mg Oral Daily   And  . hydrochlorothiazide  25 mg Oral Daily  . HYDROmorphone PCA 0.3 mg/mL   Intravenous Q4H  . multivitamins ther. w/minerals  1 tablet Oral Daily  . potassium chloride   10 mEq Intravenous Once  . potassium chloride  10 mEq Intravenous Once  . tiotropium  18 mcg Inhalation Daily  . DISCONTD: metoprolol  2.5 mg Intravenous Q6H        . 0.9 % NaCl with KCl 20 mEq / L 100 mL/hr at 09/14/11 0524  . DISCONTD: diltiazem (CARDIZEM) infusion      Assessment/Plan:  PAF:  Taking PO  Advance diet per surgery  D/C iv beta blocker and start coreg Pulm:  Consider CXR on WBC likely elevated from surgery per primary service  [facility] to follow in am  Christian Harrison 09/14/2011, 8:42 AM

## 2011-09-14 NOTE — Progress Notes (Signed)
While ambulating this afternoon pt's HR increased to 160's and he converted from NSR to Afib. Pt was diaphoretic and BP 102/73 at this time. Pt was put back to bed and EKG was obtained which confirmed that pt was in Afib with RVR. MD was notified and order given for a one time dose of 2.5mg  IV Metoprolol. Before metoprolol could be given, pt had converted back to NSR on his own. At that time HR was still 120-130 so IV metoprolol was given per MD order. Pt assessed and after 30 minutes BP was 118/80 and HR was 89. Another EKG was obtained per MD order and pt was back in NSR. MD made aware of all changes. Pt will be continue to be monitored for changes in HR and rhythm. Kem Boroughs

## 2011-09-15 LAB — CBC
MCHC: 35.1 g/dL (ref 30.0–36.0)
Platelets: 279 10*3/uL (ref 150–400)
RDW: 12.5 % (ref 11.5–15.5)
WBC: 9 10*3/uL (ref 4.0–10.5)

## 2011-09-15 LAB — BASIC METABOLIC PANEL
Chloride: 96 mEq/L (ref 96–112)
GFR calc Af Amer: >90 mL/min (ref >90)
GFR calc non Af Amer: >90 mL/min (ref >90)
Potassium: 3 mEq/L — ABNORMAL LOW (ref 3.5–5.1)
Sodium: 134 mEq/L — ABNORMAL LOW (ref 135–145)

## 2011-09-15 MED ORDER — HYDROCODONE-ACETAMINOPHEN 10-325 MG PO TABS
2.0000 | ORAL_TABLET | ORAL | Status: DC | PRN
  Administered 2011-09-15: 1 via ORAL
  Administered 2011-09-15 – 2011-09-17 (×6): 2 via ORAL
  Filled 2011-09-15 (×2): qty 2
  Filled 2011-09-15: qty 1
  Filled 2011-09-15 (×4): qty 2

## 2011-09-15 MED ORDER — POTASSIUM CHLORIDE CRYS ER 20 MEQ PO TBCR
40.0000 meq | EXTENDED_RELEASE_TABLET | Freq: Three times a day (TID) | ORAL | Status: DC
  Administered 2011-09-15 (×3): 40 meq via ORAL
  Filled 2011-09-15 (×6): qty 2

## 2011-09-15 MED ORDER — POLYETHYLENE GLYCOL 3350 17 G PO PACK
17.0000 g | PACK | Freq: Every day | ORAL | Status: DC
  Administered 2011-09-15 – 2011-09-16 (×2): 17 g via ORAL
  Filled 2011-09-15 (×3): qty 1

## 2011-09-15 NOTE — Progress Notes (Signed)
SUBJECTIVE:  No complaints of chest pain, SOB or palpitations today.  Had recurrent afib with RVR yesterday afternoon and was placed back on IV Cardizem gtt.  Now back in NSR and off of Cardizem gtt.  OBJECTIVE:   Vitals:   Filed Vitals:   09/15/11 0101 09/15/11 0534 09/15/11 0557 09/15/11 0749  BP:   127/78   Pulse:   91   Temp:   98.8 F (37.1 C)   TempSrc:   Oral   Resp: 16 18 18    Height:      Weight:      SpO2: 92% 93% 92% 93%   I&O's:   Intake/Output Summary (Last 24 hours) at 09/15/11 1225 Last data filed at 09/15/11 1191  Gross per 24 hour  Intake 1070.83 ml  Output   1796 ml  Net -725.17 ml   TELEMETRY: Reviewed telemetry pt in NSR     PHYSICAL EXAM General: Well developed, well nourished, in no acute distress Head: Eyes PERRLA, No xanthomas.   Normal cephalic and atramatic  Lungs:   Clear bilaterally to auscultation and percussion. Heart:   HRRR S1 S2 Pulses are 2+ & equal.            No carotid bruit. No JVD.  No abdominal bruits. No femoral bruits. Abdomen: abdomen soft Extremities:   No clubbing, cyanosis or edema.  DP +1 Neuro: Alert and oriented X 3. Psych:  Good affect, responds appropriately   LABS: Basic Metabolic Panel:  Basename 09/15/11 0745 09/14/11 0516  NA 134* 136  K 3.0* 3.1*  CL 96 94*  CO2 28 29  GLUCOSE 109* 98  BUN 13 9  CREATININE 0.68 0.70  CALCIUM 9.5 9.6  MG -- --  PHOS -- --    Basename 09/15/11 0745 09/13/11 0336  WBC 9.0 10.9*  NEUTROABS -- --  HGB 13.8 12.5*  HCT 39.3 36.6*  MCV 90.3 94.1  PLT 279 196   Coag Panel:   Lab Results  Component Value Date   INR 1.05 04/22/2010   INR 1.09 03/28/2010     ASSESSMENT:  1.  PAF maintaining NSR 2.  S/P hernia repair 3.  Hypokalemia  PLAN:   1.  Continue Coreg 2.  Replete potassium 3.  BMET in am  Quintella Reichert, MD  09/15/2011  12:25 PM

## 2011-09-15 NOTE — Plan of Care (Signed)
Problem: Phase III Progression Outcomes Goal: Demonstrates TCDB, IS independently Outcome: Completed/Met Date Met:  09/15/11 Pt met 2500 goal on IS

## 2011-09-15 NOTE — Progress Notes (Addendum)
5 Days Post-Op  Subjective: Alert and stable. Ambulating in halls. Voiding well. Tolerating full liquids. No stool but passing flatus. He is hungry. Denies nausea.  Recurrent  atrial fibrillation but this is now converted to NSR.  On Cardizem and Coreg. Cardiology is following.  Objective: Vital signs in last 24 hours: Temp:  [97.8 F (36.6 C)-98.8 F (37.1 C)] 98.8 F (37.1 C) (12/10 0557) Pulse Rate:  [89-145] 91  (12/10 0557) Resp:  [16-20] 18  (12/10 0557) BP: (102-140)/(73-84) 127/78 mmHg (12/10 0557) SpO2:  [92 %-99 %] 92 % (12/10 0557) Last BM Date: 09/10/11  Intake/Output from previous day: 12/09 0701 - 12/10 0700 In: 1070.8 [P.O.:120; I.V.:950.8] Out: 1789 [Urine:1750; Drains:39] Intake/Output this shift:    Resp: clear to auscultation bilaterally Cardio: regular rate and rhythm, S1, S2 normal, no murmur, click, rub or gallop GI: abdomen is soft but still very tender and touchy. Wound looks okay. Drainage is thin and serosanguineous. Not distended.  Lab Results:  No results found for this or any previous visit (from the past 24 hour(s)).   Studies/Results: @RISRSLT24 @     . antiseptic oral rinse  15 mL Mouth Rinse q12n4p  . budesonide-formoterol  2 puff Inhalation BID  . carvedilol  6.25 mg Oral BID WC  . chlorhexidine  15 mL Mouth Rinse BID  . citalopram  40 mg Oral Daily  . diltiazem  10 mg Intravenous Once  . heparin subcutaneous  5,000 Units Subcutaneous Q8H  . metoprolol      . metoprolol  2.5 mg Intravenous Once  . multivitamins ther. w/minerals  1 tablet Oral Daily  . polyethylene glycol  17 g Oral Daily  . tiotropium  18 mcg Inhalation Daily  . DISCONTD: hydrochlorothiazide  25 mg Oral Daily  . DISCONTD: HYDROmorphone PCA 0.3 mg/mL   Intravenous Q4H  . DISCONTD: lisinopril  20 mg Oral Daily  . DISCONTD: metoprolol  2.5 mg Intravenous Q6H     Assessment/Plan: s/p Procedure(s): LAPAROSCOPIC VENTRAL HERNIA REMOVAL PORT-A-CATH  Surgically he  is making progress. The ileus is resolving. I will advance his diet to solid food and will give a dose of MiraLAX to sleep we can stimulate bowel function. Willl discontinue PCA Dilaudid and convert to oral hydrocodone for pain control.  Hypokalemia: We'll check lab work today to see if this has been corrected.  Atrial fibrillation: converted to sinus rhythm on Coreg and Cardizem. Will defer to cardiology regarding medication regimen and outpatient management and followup.  Asthma:  Stable on current inhalation therapies. SaO2 94% on room air.  Hypertension: Good control on current medication regimen. HCTZ on hold due to low K+.  Possible discharge home tomorrow.   LOS: 5 days    Farran Amsden M 09/15/2011  . .prob

## 2011-09-15 NOTE — Progress Notes (Signed)
MD CCS called r/t pt's bmet results from this am and for continued cardiac monitoring.  Results given to MD k+ 3.0 and order received to give kdur x 3 doses , BMET in am and continuous cardiac monitoring.

## 2011-09-16 DIAGNOSIS — I10 Essential (primary) hypertension: Secondary | ICD-10-CM | POA: Diagnosis present

## 2011-09-16 DIAGNOSIS — J45909 Unspecified asthma, uncomplicated: Secondary | ICD-10-CM | POA: Insufficient documentation

## 2011-09-16 LAB — BASIC METABOLIC PANEL
CO2: 28 mEq/L (ref 19–32)
Chloride: 102 mEq/L (ref 96–112)
GFR calc Af Amer: >90 mL/min (ref >90)
Sodium: 140 mEq/L (ref 135–145)

## 2011-09-16 MED ORDER — POTASSIUM CHLORIDE CRYS ER 20 MEQ PO TBCR
40.0000 meq | EXTENDED_RELEASE_TABLET | Freq: Once | ORAL | Status: AC
  Administered 2011-09-16: 40 meq via ORAL
  Filled 2011-09-16: qty 2

## 2011-09-16 MED ORDER — POLYETHYLENE GLYCOL 3350 17 G PO PACK
17.0000 g | PACK | Freq: Every day | ORAL | Status: DC
  Administered 2011-09-16: 17 g via ORAL
  Filled 2011-09-16: qty 1

## 2011-09-16 MED ORDER — HYDROCODONE-ACETAMINOPHEN 5-325 MG PO TABS
1.0000 | ORAL_TABLET | ORAL | Status: AC | PRN
Start: 2011-09-16 — End: 2011-09-26

## 2011-09-16 MED ORDER — CARVEDILOL 6.25 MG PO TABS: 6.2500 mg | ORAL_TABLET | Freq: Two times a day (BID) | ORAL | Status: DC

## 2011-09-16 NOTE — Progress Notes (Signed)
6 Days Post-Op  Subjective: Alert. Tolerating regular diet. Passing flatus. States he has not had a stool. States he has not had a MiraLAX. Ambulating. Voiding okay.  He has remained in normal sinus rhythm during the night.     On Coreg.    Cardiology is following.  Morning labs are pending  Objective: Vital signs in last 24 hours: Temp:  [97.1 F (36.2 C)-98.4 F (36.9 C)] 98.4 F (36.9 C) (12/11 0515) Pulse Rate:  [92-105] 92  (12/11 0515) Resp:  [18-20] 20  (12/11 0515) BP: (110-126)/(62-85) 126/80 mmHg (12/11 0515) SpO2:  [93 %-95 %] 94 % (12/11 0515) Last BM Date: 09/09/11  Intake/Output from previous day: 12/10 0701 - 12/11 0700 In: 170 [P.O.:170] Out: 29 [Drains:29] Intake/Output this shift: Total I/O In: -  Out: 9 [Drains:9]  Resp: clear to auscultation bilaterally Cardio: regular rate and rhythm, S1, S2 normal, no murmur, click, rub or gallop GI: abdomen is soft but diffusely tender and tight sheet. Bowel sounds are active. Wound looks clean. Jackson-Pratt drainage is serosanguineous and relatively low volume.  Lab Results:  Results for orders placed during the hospital encounter of 09/10/11 (from the past 24 hour(s))  CBC     Status: Normal   Collection Time   09/15/11  7:45 AM      Component Value Range   WBC 9.0  4.0 - 10.5 (K/uL)   RBC 4.35  4.22 - 5.81 (MIL/uL)   Hemoglobin 13.8  13.0 - 17.0 (g/dL)   HCT 16.1  09.6 - 04.5 (%)   MCV 90.3  78.0 - 100.0 (fL)   MCH 31.7  26.0 - 34.0 (pg)   MCHC 35.1  30.0 - 36.0 (g/dL)   RDW 40.9  81.1 - 91.4 (%)   Platelets 279  150 - 400 (K/uL)  BASIC METABOLIC PANEL     Status: Abnormal   Collection Time   09/15/11  7:45 AM      Component Value Range   Sodium 134 (*) 135 - 145 (mEq/L)   Potassium 3.0 (*) 3.5 - 5.1 (mEq/L)   Chloride 96  96 - 112 (mEq/L)   CO2 28  19 - 32 (mEq/L)   Glucose, Bld 109 (*) 70 - 99 (mg/dL)   BUN 13  6 - 23 (mg/dL)   Creatinine, Ser 7.82  0.50 - 1.35 (mg/dL)   Calcium 9.5  8.4 - 95.6  (mg/dL)   GFR calc non Af Amer >90  >90 (mL/min)   GFR calc Af Amer >90  >90 (mL/min)  BASIC METABOLIC PANEL     Status: Abnormal   Collection Time   09/16/11  4:30 AM      Component Value Range   Sodium 140  135 - 145 (mEq/L)   Potassium 3.5  3.5 - 5.1 (mEq/L)   Chloride 102  96 - 112 (mEq/L)   CO2 28  19 - 32 (mEq/L)   Glucose, Bld 97  70 - 99 (mg/dL)   BUN 18  6 - 23 (mg/dL)   Creatinine, Ser 2.13  0.50 - 1.35 (mg/dL)   Calcium 9.2  8.4 - 08.6 (mg/dL)   GFR calc non Af Amer 90 (*) >90 (mL/min)   GFR calc Af Amer >90  >90 (mL/min)     Studies/Results: @RISRSLT24 @     . antiseptic oral rinse  15 mL Mouth Rinse q12n4p  . budesonide-formoterol  2 puff Inhalation BID  . carvedilol  6.25 mg Oral BID WC  . chlorhexidine  15  mL Mouth Rinse BID  . citalopram  40 mg Oral Daily  . diltiazem  10 mg Intravenous Once  . heparin subcutaneous  5,000 Units Subcutaneous Q8H  . multivitamins ther. w/minerals  1 tablet Oral Daily  . polyethylene glycol  17 g Oral Daily  . polyethylene glycol  17 g Oral Daily  . potassium chloride  40 mEq Oral TID  . tiotropium  18 mcg Inhalation Daily  . DISCONTD: HYDROmorphone PCA 0.3 mg/mL   Intravenous Q4H     Assessment/Plan: s/p Procedure(s): LAPAROSCOPIC VENTRAL HERNIA REMOVAL PORT-A-CATH  Postop state: He is making progress. He will need to have a bowel movement prior to discharge home. Continue oral hydrocodone for pain control seems to be we will give a dose of MiraLAX now and repeat at noon if necessary to stimulate bowel function.  Hypokalemia: Potassium 3.0 yesterday. Was given K. Dur 40 mEq x3 doses. Check labs now. HCTZ on hold.  Atrial fibrillation:  . Now converted to sinus rhythm on Coreg. Cardizem drip has been discontinued. We'll defer to cardiology regarding medication regimen and outpatient management and followup.  Asthma: Stable on  current inhalation therapies.  Hypertension: Good control of her medication regimen. Will  plan discharge on current medications, pending cardiology approval.  Assuming he has a bowel movement today and cardiology clears him, he could possibly go home and followup with me in the office next week for staple removal and drain removal.    LOS: 6 days    Mar Walmer M 09/16/2011  . .prob

## 2011-09-16 NOTE — Progress Notes (Signed)
Call to Dr. Derrell Lolling as pt has not had BM, pt wants enema. Will await call back. Wife will be here shortly as she worked today and she is his caregiver/transportation home.

## 2011-09-16 NOTE — Progress Notes (Signed)
Pt declined enema that was ordered (after he request it) States he wants to stay the night and try to have BM naturally. Pt did eat dinner and is ambulating in hallway.

## 2011-09-16 NOTE — Discharge Summary (Signed)
Patient ID: Christian Harrison [id number] 65 y.o. November 03, 1945  09/10/2011  Discharge date and time: Dec. 12, 2012 at 0800  Admitting Physician: Christian Harrison  Discharge Physician: Christian Harrison  Admission Diagnoses: ventral incisional hernia, colon cancer  Discharge Diagnoses: incarcerated ventral incisional hernia. Asthma. Hypertension. New onset  atrial fibrillation with rapid ventricular response., resolved Past history right colectomy for colon cancer, no evidence of recurrence one year postop Desires Port-A-Cath removal Hypokalemia, resolved  Operations: Procedure(s): LAPAROSCOPIC LYSIS OF ADHESIONS, OPEN REPAIR  VENTRAL HERNIA WITH MESH REMOVAL PORT-A-CATH  Admission Condition: good  Discharged Condition: good  Indication for Admission: this 65 year old Caucasian male underwent laparoscopic-assisted right colectomy on 03/21/2010 for pathologic stage T3,  N1 B. Carcinoma of the colon. He received adjuvant chemotherapy under the guidance of Dr. Mancel Harrison. Colonoscopy at  one year looks fine. He has a painful ventral hernia. He would like to have the Port-A-Cath removed and the hernia repaired. He is admitted to the hospital electively.  Hospital Course: on the day of admission the patient was taken operating room. He underwent laparoscopic lysis of adhesions. We found that he had very extensive adhesions and he had a much larger hernia than we thought. We converted to an open procedure and a large inlay repair with mesh. The surgery was otherwise uneventful.  Postoperatively the patient had an ileus for 2 or 3 days. He did progress and his diet and activities thereafter. He was able to void and laid in the hall. He was somewhat constipated and required Christian Harrison to stimulate a bowel movement.  On December 8 the patient went into atrial fibrillation with rapid ventricular response. He was relatively asymptomatic. He was started on Cardizem drip and converted to a sinus  rhythm. Over the next 2 days he went in and out of atrial fibrillation. Dr. Carolanne Harrison was saw him in consultation and start him on coreg 6.25 mg twice daily. He stayed in normal sinus rhythm thereafter.  At the time of discharge his wounds were clean. Staples were in place. He was still draining serosanguineous drainage the drainage and we left the drains in place.  He was told to continue all of his usual medications.  He was given a prescription for Vicodin for pain and a new prescription for Coreg 6.25 mg by mouth twice daily  He was to followup with Christian Harrison in the office in 5 days for staple removal and drain removal.  He was asked to see Dr. Carolanne Harrison in 2 weeks. He was instructed to call or return if he developed any palpitations.   Consults: cardiology  Significant Diagnostic Studies: none  Treatments: IV hydration. Cardizem drip.  Disposition: home  Patient Instructions:   Christian, Harrison  Home Medication Instructions [id number]   Printed on:09/16/11 0849  Medication Information                    tiotropium (SPIRIVA) 18 MCG inhalation capsule Place 18 mcg into inhaler and inhale daily.             budesonide-formoterol (SYMBICORT) 80-4.5 MCG/ACT inhaler Inhale 2 puffs into the lungs 2 (two) times daily.             citalopram (CELEXA) 40 MG tablet Take 40 mg by mouth every morning.            Multiple Vitamins-Minerals (MULTIVITAMINS THER. W/MINERALS) TABS Take 1 tablet by mouth daily.  albuterol (PROVENTIL HFA;VENTOLIN HFA) 108 (90 BASE) MCG/ACT inhaler Inhale 2 puffs into the lungs every 6 (six) hours as needed. For shortness of breath            lisinopril-hydrochlorothiazide (PRINZIDE,ZESTORETIC) 20-25 MG per tablet Take 1 tablet by mouth every morning.             albuterol (PROVENTIL) (2.5 MG/3ML) 0.083% nebulizer solution Take 2.5 mg by nebulization every 6 (six) hours as needed. For shortness of breath            zolpidem (AMBIEN)  10 MG tablet Take 10 mg by mouth at bedtime as needed. For sleep            carvedilol (COREG) 6.25 MG tablet Take 1 tablet (6.25 mg total) by mouth 2 (two) times daily with a meal.           HYDROcodone-acetaminophen (NORCO) 5-325 MG per tablet Take 1-2 tablets by mouth every 4 (four) hours as needed for pain.             Activity: the patient was given extensive written instruction in terms of diet,, activities, wound care, drain care, followup with Christian Harrison, and followup with Dr. Carolanne Harrison. He was given a prescription for Vicodin for pain and Rx  for Coreg 6.25 mg BID. Diet: low fat, low cholesterol diet Wound Care: as directed  Follow-up:  With Christian Harrison  in 5 days.  Signed: Angelia Mould. Derrell Harrison, M.D., FACS General and minimally invasive surgery Breast and Colorectal Surgery  09/17/2011 7:43 AM

## 2011-09-17 MED ORDER — POLYETHYLENE GLYCOL 3350 17 G PO PACK
17.0000 g | PACK | Freq: Once | ORAL | Status: AC
  Administered 2011-09-17: 17 g via ORAL
  Filled 2011-09-17: qty 1

## 2011-09-17 MED ORDER — POLYETHYLENE GLYCOL 3350 17 G PO PACK: 17.0000 g | PACK | Freq: Once | ORAL | Status: DC

## 2011-09-17 NOTE — Progress Notes (Addendum)
7 Days Post-Op  Subjective: He feels fine. Tolerating regular diet. Passing a lot of flatus. Still no bowel movement. He is begging to go home. Pain is under good control. Drainage from drains  about 25 cc per shift.  Objective: Vital signs in last 24 hours: Temp:  [97.9 F (36.6 C)-98.7 F (37.1 C)] 97.9 F (36.6 C) (12/12 0542) Pulse Rate:  [72-92] 78  (12/12 0542) Resp:  [20] 20  (12/12 0542) BP: (120-138)/(77-81) 138/81 mmHg (12/12 0542) SpO2:  [94 %-97 %] 97 % (12/12 0542) Last BM Date: 09/09/11  Intake/Output from previous day: 12/11 0701 - 12/12 0700 In: 1035 [P.O.:1000] Out: 1324 [Urine:1275; Drains:49] Intake/Output this shift:    Cardio: regular rate and rhythm, S1, S2 normal, no murmur, click, rub or gallop GI: abdomen is soft. Not distended. Positive bowel sounds. Midline wound looks good very clean. No fluid collection drainage. Drainage is thin, serosanguineous.  Lab Results:  No results found for this or any previous visit (from the past 24 hour(s)).   Studies/Results: @RISRSLT24 @     . antiseptic oral rinse  15 mL Mouth Rinse q12n4p  . budesonide-formoterol  2 puff Inhalation BID  . carvedilol  6.25 mg Oral BID WC  . chlorhexidine  15 mL Mouth Rinse BID  . citalopram  40 mg Oral Daily  . diltiazem  10 mg Intravenous Once  . heparin subcutaneous  5,000 Units Subcutaneous Q8H  . multivitamins ther. w/minerals  1 tablet Oral Daily  . polyethylene glycol  17 g Oral Daily  . potassium chloride  40 mEq Oral Once  . tiotropium  18 mcg Inhalation Daily  . DISCONTD: polyethylene glycol  17 g Oral Daily  . DISCONTD: potassium chloride  40 mEq Oral TID     Assessment/Plan: s/p Procedure(s): LAPAROSCOPIC VENTRAL HERNIA REMOVAL PORT-A-CATH  Incarcerated ventral incisional hernia, recovering without obvious complication following open repair with mesh.  Constipation -  chronic.  New-onset atrial fibrillation with rapid ventricular response -  resolved.  Past history right colectomy for colon cancer, no evidence of recurrence one year postop  Hypertension -  Stable on current medications.  Asthma - stable current inhalation therapy.  Hypokalemia, resolved  We're going to let him go home today. He is going to take MiraLAX every 6 hours until he has a bowel movement. If he does not have a bowel movement by tomorrow he is to call me and we'll call in a  colonoscopy prep. I will see him back in the office next week for staple removal and possible drain removal.  He has been given a prescription for Vicodin and for Coreg. He will otherwise continue his usual medicines.   LOS: 7 days    Christian Harrison M 09/17/2011  . .prob

## 2011-09-17 NOTE — Progress Notes (Signed)
Pt discharged to home today. Instructions reviewed with pt/spouse r/t home medications and f/u appointments. Pt/spuose verbalized understanding of instruction. Pt/spouse educated on drain care also. IV dc'd and intact. Pt stable at this time

## 2011-09-18 ENCOUNTER — Emergency Department (HOSPITAL_COMMUNITY): Payer: Medicare Other

## 2011-09-18 ENCOUNTER — Inpatient Hospital Stay (HOSPITAL_COMMUNITY)
Admission: EM | Admit: 2011-09-18 | Discharge: 2011-09-26 | DRG: 394 | Disposition: A | Discharge status: Home / Self Care | Payer: Medicare Other | Attending: General Surgery | Admitting: General Surgery

## 2011-09-18 ENCOUNTER — Telehealth (INDEPENDENT_AMBULATORY_CARE_PROVIDER_SITE_OTHER): Payer: Self-pay | Admitting: Surgery

## 2011-09-18 ENCOUNTER — Other Ambulatory Visit (INDEPENDENT_AMBULATORY_CARE_PROVIDER_SITE_OTHER): Payer: Self-pay | Admitting: Surgery

## 2011-09-18 DIAGNOSIS — I4891 Unspecified atrial fibrillation: Secondary | ICD-10-CM | POA: Diagnosis not present

## 2011-09-18 DIAGNOSIS — K56609 Unspecified intestinal obstruction, unspecified as to partial versus complete obstruction: Secondary | ICD-10-CM | POA: Diagnosis present

## 2011-09-18 DIAGNOSIS — Y838 Other surgical procedures as the cause of abnormal reaction of the patient, or of later complication, without mention of misadventure at the time of the procedure: Secondary | ICD-10-CM | POA: Diagnosis present

## 2011-09-18 DIAGNOSIS — I10 Essential (primary) hypertension: Secondary | ICD-10-CM | POA: Diagnosis present

## 2011-09-18 DIAGNOSIS — Z9049 Acquired absence of other specified parts of digestive tract: Secondary | ICD-10-CM

## 2011-09-18 DIAGNOSIS — K5669 Other intestinal obstruction: Secondary | ICD-10-CM | POA: Diagnosis present

## 2011-09-18 DIAGNOSIS — R112 Nausea with vomiting, unspecified: Secondary | ICD-10-CM | POA: Diagnosis present

## 2011-09-18 DIAGNOSIS — E876 Hypokalemia: Secondary | ICD-10-CM | POA: Diagnosis present

## 2011-09-18 DIAGNOSIS — K560 Paralytic ileus: Secondary | ICD-10-CM | POA: Diagnosis present

## 2011-09-18 DIAGNOSIS — E46 Unspecified protein-calorie malnutrition: Secondary | ICD-10-CM | POA: Diagnosis present

## 2011-09-18 DIAGNOSIS — J45909 Unspecified asthma, uncomplicated: Secondary | ICD-10-CM | POA: Insufficient documentation

## 2011-09-18 DIAGNOSIS — K430 Incisional hernia with obstruction, without gangrene: Secondary | ICD-10-CM | POA: Diagnosis present

## 2011-09-18 DIAGNOSIS — K929 Disease of digestive system, unspecified: Principal | ICD-10-CM | POA: Diagnosis present

## 2011-09-18 HISTORY — DX: Incisional hernia with obstruction, without gangrene: K43.0

## 2011-09-18 HISTORY — DX: Gastro-esophageal reflux disease without esophagitis: K21.9

## 2011-09-18 HISTORY — DX: Diaphragmatic hernia without obstruction or gangrene: K44.9

## 2011-09-18 HISTORY — DX: Other complications of anesthesia, initial encounter: T88.59XA

## 2011-09-18 HISTORY — DX: Adverse effect of unspecified anesthetic, initial encounter: T41.45XA

## 2011-09-18 LAB — CBC
Hemoglobin: 14 g/dL (ref 13.0–17.0)
MCH: 32.5 pg (ref 26.0–34.0)
MCHC: 35.6 g/dL (ref 30.0–36.0)
MCV: 91.2 fL (ref 78.0–100.0)

## 2011-09-18 LAB — DIFFERENTIAL
Basophils Relative: 0 % (ref 0–1)
Eosinophils Absolute: 0 10*3/uL (ref 0.0–0.7)
Eosinophils Relative: 0 % (ref 0–5)
Monocytes Absolute: 1 10*3/uL (ref 0.1–1.0)
Monocytes Relative: 7 % (ref 3–12)
Neutrophils Relative %: 88 % — ABNORMAL HIGH (ref 43–77)

## 2011-09-18 MED ORDER — SODIUM CHLORIDE 0.9 % IV BOLUS (SEPSIS)
500.0000 mL | Freq: Once | INTRAVENOUS | Status: AC
  Administered 2011-09-18: 23:00:00 via INTRAVENOUS

## 2011-09-18 MED ORDER — ONDANSETRON HCL 4 MG/2ML IJ SOLN
4.0000 mg | Freq: Once | INTRAMUSCULAR | Status: AC
  Administered 2011-09-18: 4 mg via INTRAVENOUS
  Filled 2011-09-18: qty 2

## 2011-09-18 MED ORDER — MORPHINE SULFATE 2 MG/ML IJ SOLN
2.0000 mg | Freq: Once | INTRAMUSCULAR | Status: AC
  Administered 2011-09-18: 2 mg via INTRAVENOUS
  Filled 2011-09-18: qty 1

## 2011-09-18 NOTE — Telephone Encounter (Signed)
The patient's wife called me over concerns on her husband. She notes he's been having nausea and vomiting all day. She's tried is to do soups. She can try to do her nausea medicines.  She tells me they're about to run out of her nausea medicines. She doesn't know what to do. She was hoping that I could just call in some more nausea medicines. Then she realized that the pharmacy is closed. She couldn't think of another pharmacy for an alternative.  She noted that the patient just got home yesterday after a one-week stay for a laparoscopic converted to open ventral hernia repair by Dr. Derrell Lolling.  The pt had had problems with his rapid heart rate, ?ileus, et Karie Soda.  I tried to encourage them to go to the emergency room for acute evaluation. I concerned that they've exhausted their outpatient options to manage this. He probably needs lab work, IV fluids, and nausea medicines. Perhaps he can be stabilized and go home with close outpatient followup. I am concerned he is at risk for needing readmission.  I got the sense that the patient was very hesitant and wanted to wait till the morning. The wife did note 2. After re\re recommending about 5 times, I noted that it was ultimately their decision. She thinks she's more convinced to consider coming to the ER. I will wait to hear from them.

## 2011-09-18 NOTE — ED Notes (Signed)
Pt. Was discharged from [facility] yesterday after abdominal surgery for a hernia.  Portacath removed from left chest after receiving chemo for colon ca last year.  Pt. Reports pain at the incision site.  He has not been able to take oral pain or nausea medication due to vomiting.

## 2011-09-18 NOTE — ED Provider Notes (Signed)
History     CSN: [id number] Arrival date & time: 09/18/2011 10:19 PM   First MD Initiated Contact with Patient 09/18/11 2254      No chief complaint on file.   (Consider location/radiation/quality/duration/timing/severity/associated sxs/prior treatment) Patient is a 65 y.o. male presenting with vomiting.  Emesis    patient had surgery approximately 8 days ago for ventral hernia repair. He was discharged from the hospital yesterday. The wife states he vomited a large amount of nonbilious emesis. This morning. She states she was having crampy abdominal pain throughout the day and vomited again. This evening. He was unable to keep down any fluids. He has had no fevers. He does report a normal bowel movement today and has been urinating normally. Initial vital signs in triage, appear to be normal. Currently, abdominal pain is mild and diffuse, but more in the right. His incision appears to be intact and his drains are draining appropriately.  Past Medical History  Diagnosis Date  . Hypertension   . Hernia   . Asthma 09-08-11    controlled with inhalers  . Colon cancer 09-08-11    colon ca dx 02/2010. Chemo x 12 sessions  . COPD (chronic obstructive pulmonary disease) 09-08-11    previous smoker  . Dysrhythmia 03/2010    PAF after colon resection in the setting of partial SBO -    Past Surgical History  Procedure Date  . Hand surgery     LEFT  . Colon surgery 09-08-11    surgery in 6'11  . Dg finger*l* 09-08-11    left index finger-tendon release  . Ventral hernia repair 09/10/2011    Procedure: LAPAROSCOPIC VENTRAL HERNIA;  Surgeon: [name], MD;  Location: [facility];  Service: General;  Laterality: N/A;  Lysis of Adhessions, Converted to open, with Physio Mesh  . Port-a-cath removal 09/10/2011    Procedure: REMOVAL PORT-A-CATH;  Surgeon: [name], MD;  Location: [facility];  Service: General;  Laterality: N/A;  Removal Port-a-Cath    Family History  Problem Relation Age  of Onset  . Heart disease Mother     History  Substance Use Topics  . Smoking status: Former Smoker -- 1 years  . Smokeless tobacco: Never Used  . Alcohol Use: No      Review of Systems  Gastrointestinal: Positive for vomiting.  All other systems reviewed and are negative.    Allergies  Penicillins  Home Medications   Current Outpatient Rx  Name Route Sig Dispense Refill  . ALBUTEROL SULFATE HFA 108 (90 BASE) MCG/ACT IN AERS Inhalation Inhale 2 puffs into the lungs every 6 (six) hours as needed. For shortness of breath     . ALBUTEROL SULFATE (2.5 MG/3ML) 0.083% IN NEBU Nebulization Take 2.5 mg by nebulization every 6 (six) hours as needed. For shortness of breath     . BUDESONIDE-FORMOTEROL FUMARATE 80-4.5 MCG/ACT IN AERO Inhalation Inhale 2 puffs into the lungs 2 (two) times daily.      Marland Kitchen CARVEDILOL 6.25 MG PO TABS Oral Take 1 tablet (6.25 mg total) by mouth 2 (two) times daily with a meal. 60 tablet 2  . CITALOPRAM HYDROBROMIDE 40 MG PO TABS Oral Take 40 mg by mouth every morning.     Marland Kitchen HYDROCODONE-ACETAMINOPHEN 5-325 MG PO TABS Oral Take 1-2 tablets by mouth every 4 (four) hours as needed for pain. 40 tablet 1  . LISINOPRIL-HYDROCHLOROTHIAZIDE 20-25 MG PO TABS Oral Take 1 tablet by mouth every morning.      Marland Kitchen  THERA M PLUS PO TABS Oral Take 1 tablet by mouth daily.      Marland Kitchen TIOTROPIUM BROMIDE MONOHYDRATE 18 MCG IN CAPS Inhalation Place 18 mcg into inhaler and inhale daily.      Marland Kitchen ZOLPIDEM TARTRATE 10 MG PO TABS Oral Take 10 mg by mouth at bedtime as needed. For sleep       BP 161/90  Pulse 96  Temp(Src) 97.6 F (36.4 C) (Axillary)  Resp 20  SpO2 97%  Physical Exam  Nursing note and vitals reviewed. Constitutional: He is oriented to person, place, and time. He appears well-developed and well-nourished.  HENT:  Head: Normocephalic and atraumatic.  Eyes: Conjunctivae and EOM are normal. Pupils are equal, round, and reactive to light.  Neck: Neck supple.    Cardiovascular: Normal rate and regular rhythm.  Exam reveals no gallop and no friction rub.   No murmur heard. Pulmonary/Chest: Breath sounds normal. He has no wheezes. He has no rales. He exhibits no tenderness.  Abdominal: Soft. Bowel sounds are normal. He exhibits no distension. There is tenderness. There is no rebound and no guarding.       Mild diffuse tenderness, no peritoneal signs. Bowel sounds are normal to slightly decreased. Incisions are intact. No surrounding erythema. Drains are in place.  Musculoskeletal: Normal range of motion.  Neurological: He is alert and oriented to person, place, and time. No cranial nerve deficit. Coordination normal.  Skin: Skin is warm and dry. No rash noted.  Psychiatric: He has a normal mood and affect.    ED Course  Procedures (including critical care time)  Labs Reviewed  CBC - Abnormal; Notable for the following:    WBC 15.2 (*)    All other components within normal limits  DIFFERENTIAL - Abnormal; Notable for the following:    Neutrophils Relative 88 (*)    Neutro Abs 13.3 (*)    Lymphocytes Relative 5 (*)    All other components within normal limits  COMPREHENSIVE METABOLIC PANEL - Abnormal; Notable for the following:    Glucose, Bld 177 (*)    All other components within normal limits  LIPASE, BLOOD - Abnormal; Notable for the following:    Lipase 70 (*)    All other components within normal limits  URINALYSIS, ROUTINE W REFLEX MICROSCOPIC   Dg Abd Acute W/chest  09/18/2011  *RADIOLOGY REPORT*  Clinical Data: Nausea and vomiting.  Abdominal pain.  ACUTE ABDOMEN SERIES (ABDOMEN 2 VIEW & CHEST 1 VIEW)  Comparison: CT scan from 03/25/2011.  X-rays from 04/18/2010  Findings: Lung volumes are low.  There is some basilar atelectasis. The cardiopericardial silhouette is enlarged.  No overt airspace pulmonary edema or focal lung consolidation.  Right-sided decubitus view of the abdomen shows no evidence for intraperitoneal free air.  Supine  film shows some dilated small bowel the left abdomen measuring up to 4.3 cm in diameter.  There appears to be some air in nondilated right colon.  Two surgical drains are coiled over the central abdomen.  Midline skin staples are evident.  IMPRESSION: Cardiomegaly without acute cardiopulmonary process.  There is some minimal basilar atelectasis.  No intraperitoneal free air.  There is some dilated small bowel in the left abdomen with gas in nondilated bowel loops on the right.  Imaging features may be related to ileus or evolving obstruction.  Original Report Authenticated By: ERIC A. MANSELL, M.D.     No diagnosis found.    MDM  Pt is seen and examined;  Initial  history and physical completed.  Will follow.        Results for orders placed during the hospital encounter of 09/18/11  CBC      Component Value Range   WBC 15.2 (*) 4.0 - 10.5 (K/uL)   RBC 4.31  4.22 - 5.81 (MIL/uL)   Hemoglobin 14.0  13.0 - 17.0 (g/dL)   HCT 16.1  09.6 - 04.5 (%)   MCV 91.2  78.0 - 100.0 (fL)   MCH 32.5  26.0 - 34.0 (pg)   MCHC 35.6  30.0 - 36.0 (g/dL)   RDW 40.9  81.1 - 91.4 (%)   Platelets 325  150 - 400 (K/uL)  DIFFERENTIAL      Component Value Range   Neutrophils Relative 88 (*) 43 - 77 (%)   Neutro Abs 13.3 (*) 1.7 - 7.7 (K/uL)   Lymphocytes Relative 5 (*) 12 - 46 (%)   Lymphs Abs 0.8  0.7 - 4.0 (K/uL)   Monocytes Relative 7  3 - 12 (%)   Monocytes Absolute 1.0  0.1 - 1.0 (K/uL)   Eosinophils Relative 0  0 - 5 (%)   Eosinophils Absolute 0.0  0.0 - 0.7 (K/uL)   Basophils Relative 0  0 - 1 (%)   Basophils Absolute 0.0  0.0 - 0.1 (K/uL)  COMPREHENSIVE METABOLIC PANEL      Component Value Range   Sodium 136  135 - 145 (mEq/L)   Potassium 3.7  3.5 - 5.1 (mEq/L)   Chloride 99  96 - 112 (mEq/L)   CO2 26  19 - 32 (mEq/L)   Glucose, Bld 177 (*) 70 - 99 (mg/dL)   BUN 23  6 - 23 (mg/dL)   Creatinine, Ser 7.82  0.50 - 1.35 (mg/dL)   Calcium 95.6  8.4 - 10.5 (mg/dL)   Total Protein 7.0  6.0 -  8.3 (g/dL)   Albumin 3.5  3.5 - 5.2 (g/dL)   AST 22  0 - 37 (U/L)   ALT 31  0 - 53 (U/L)   Alkaline Phosphatase 75  39 - 117 (U/L)   Total Bilirubin 0.4  0.3 - 1.2 (mg/dL)   GFR calc non Af Amer >90  >90 (mL/min)   GFR calc Af Amer >90  >90 (mL/min)  LIPASE, BLOOD      Component Value Range   Lipase 70 (*) 11 - 59 (U/L)   Dg Abd Acute W/chest  09/18/2011  *RADIOLOGY REPORT*  Clinical Data: Nausea and vomiting.  Abdominal pain.  ACUTE ABDOMEN SERIES (ABDOMEN 2 VIEW & CHEST 1 VIEW)  Comparison: CT scan from 03/25/2011.  X-rays from 04/18/2010  Findings: Lung volumes are low.  There is some basilar atelectasis. The cardiopericardial silhouette is enlarged.  No overt airspace pulmonary edema or focal lung consolidation.  Right-sided decubitus view of the abdomen shows no evidence for intraperitoneal free air.  Supine film shows some dilated small bowel the left abdomen measuring up to 4.3 cm in diameter.  There appears to be some air in nondilated right colon.  Two surgical drains are coiled over the central abdomen.  Midline skin staples are evident.  IMPRESSION: Cardiomegaly without acute cardiopulmonary process.  There is some minimal basilar atelectasis.  No intraperitoneal free air.  There is some dilated small bowel in the left abdomen with gas in nondilated bowel loops on the right.  Imaging features may be related to ileus or evolving obstruction.  Original Report Authenticated By: ERIC A. MANSELL, M.D.   12:18 AM  Discussed with general surgery, Dr. gross. Recommended CT scan and likely patient will require admission. Plain x-rays showed dilated small bowel, possibly related to ileus or evolving obstruction.   Jeno Calleros A. Patrica Duel, MD 09/19/11 [id number]

## 2011-09-19 ENCOUNTER — Encounter (INDEPENDENT_AMBULATORY_CARE_PROVIDER_SITE_OTHER): Payer: Self-pay | Admitting: Surgery

## 2011-09-19 ENCOUNTER — Emergency Department (HOSPITAL_COMMUNITY): Payer: Medicare Other

## 2011-09-19 ENCOUNTER — Encounter (HOSPITAL_COMMUNITY): Payer: Self-pay | Admitting: Surgery

## 2011-09-19 ENCOUNTER — Other Ambulatory Visit (INDEPENDENT_AMBULATORY_CARE_PROVIDER_SITE_OTHER): Payer: Self-pay | Admitting: Surgery

## 2011-09-19 DIAGNOSIS — K56609 Unspecified intestinal obstruction, unspecified as to partial versus complete obstruction: Secondary | ICD-10-CM | POA: Diagnosis present

## 2011-09-19 LAB — COMPREHENSIVE METABOLIC PANEL
Albumin: 3.5 g/dL (ref 3.5–5.2)
BUN: 23 mg/dL (ref 6–23)
Calcium: 10.1 mg/dL (ref 8.4–10.5)
Creatinine, Ser: 0.74 mg/dL (ref 0.50–1.35)
GFR calc Af Amer: >90 mL/min (ref >90)
Potassium: 3.7 mEq/L (ref 3.5–5.1)
Total Protein: 7 g/dL (ref 6.0–8.3)

## 2011-09-19 LAB — URINE MICROSCOPIC-ADD ON

## 2011-09-19 LAB — URINALYSIS, ROUTINE W REFLEX MICROSCOPIC
Leukocytes, UA: NEGATIVE
Nitrite: NEGATIVE
Specific Gravity, Urine: 1.029 (ref 1.005–1.030)
Urobilinogen, UA: 1 mg/dL (ref 0.0–1.0)
pH: 6 (ref 5.0–8.0)

## 2011-09-19 LAB — LIPASE, BLOOD: Lipase: 70 U/L — ABNORMAL HIGH (ref 11–59)

## 2011-09-19 MED ORDER — LACTATED RINGERS IV BOLUS (SEPSIS): 1000.0000 mL | Freq: Three times a day (TID) | INTRAVENOUS | Status: AC | PRN

## 2011-09-19 MED ORDER — ALBUTEROL SULFATE (5 MG/ML) 0.5% IN NEBU: 2.5000 mg | INHALATION_SOLUTION | Freq: Four times a day (QID) | RESPIRATORY_TRACT | Status: DC | PRN

## 2011-09-19 MED ORDER — FLORA-Q PO CAPS
1.0000 | ORAL_CAPSULE | Freq: Every day | ORAL | Status: DC
  Administered 2011-09-19 – 2011-09-26 (×8): 1 via ORAL
  Filled 2011-09-19 (×8): qty 1

## 2011-09-19 MED ORDER — LIP MEDEX EX OINT
1.0000 "application " | TOPICAL_OINTMENT | Freq: Two times a day (BID) | CUTANEOUS | Status: DC
  Filled 2011-09-19: qty 7

## 2011-09-19 MED ORDER — DEXTROSE IN LACTATED RINGERS 5 % IV SOLN
INTRAVENOUS | Status: DC
  Administered 2011-09-19 (×3): via INTRAVENOUS

## 2011-09-19 MED ORDER — BUDESONIDE-FORMOTEROL FUMARATE 80-4.5 MCG/ACT IN AERO
2.0000 | INHALATION_SPRAY | Freq: Two times a day (BID) | RESPIRATORY_TRACT | Status: DC
  Administered 2011-09-19 – 2011-09-26 (×15): 2 via RESPIRATORY_TRACT
  Filled 2011-09-19 (×2): qty 6.9

## 2011-09-19 MED ORDER — IOHEXOL 300 MG/ML  SOLN
100.0000 mL | Freq: Once | INTRAMUSCULAR | Status: AC | PRN
  Administered 2011-09-19: 100 mL via INTRAVENOUS

## 2011-09-19 MED ORDER — ONDANSETRON HCL 4 MG/2ML IJ SOLN
4.0000 mg | Freq: Four times a day (QID) | INTRAMUSCULAR | Status: AC
  Administered 2011-09-19 – 2011-09-20 (×4): 4 mg via INTRAVENOUS
  Filled 2011-09-19 (×3): qty 2

## 2011-09-19 MED ORDER — MENTHOL 3 MG MT LOZG
1.0000 | LOZENGE | OROMUCOSAL | Status: DC | PRN
  Filled 2011-09-19 (×2): qty 9

## 2011-09-19 MED ORDER — BISACODYL 10 MG RE SUPP: 10.0000 mg | Freq: Two times a day (BID) | RECTAL | Status: DC | PRN

## 2011-09-19 MED ORDER — ALBUTEROL SULFATE (5 MG/ML) 0.5% IN NEBU
2.5000 mg | INHALATION_SOLUTION | Freq: Four times a day (QID) | RESPIRATORY_TRACT | Status: DC | PRN
  Administered 2011-09-22 – 2011-09-23 (×3): 2.5 mg via RESPIRATORY_TRACT
  Filled 2011-09-19 (×3): qty 0.5

## 2011-09-19 MED ORDER — ONDANSETRON HCL 4 MG/2ML IJ SOLN
4.0000 mg | Freq: Four times a day (QID) | INTRAMUSCULAR | Status: DC | PRN
  Administered 2011-09-19 – 2011-09-22 (×4): 4 mg via INTRAVENOUS
  Filled 2011-09-19 (×5): qty 2

## 2011-09-19 MED ORDER — TIOTROPIUM BROMIDE MONOHYDRATE 18 MCG IN CAPS
18.0000 ug | ORAL_CAPSULE | Freq: Every day | RESPIRATORY_TRACT | Status: DC
  Administered 2011-09-19 – 2011-09-26 (×6): 18 ug via RESPIRATORY_TRACT
  Filled 2011-09-19 (×2): qty 5

## 2011-09-19 MED ORDER — CARVEDILOL 6.25 MG PO TABS
6.2500 mg | ORAL_TABLET | Freq: Two times a day (BID) | ORAL | Status: DC
  Administered 2011-09-19 – 2011-09-26 (×13): 6.25 mg via ORAL
  Filled 2011-09-19 (×17): qty 1

## 2011-09-19 MED ORDER — ALUM & MAG HYDROXIDE-SIMETH 200-200-20 MG/5ML PO SUSP: 30.0000 mL | Freq: Four times a day (QID) | ORAL | Status: DC | PRN

## 2011-09-19 MED ORDER — PROMETHAZINE HCL 25 MG/ML IJ SOLN
12.5000 mg | Freq: Four times a day (QID) | INTRAMUSCULAR | Status: DC | PRN
  Administered 2011-09-19 – 2011-09-20 (×2): 12.5 mg via INTRAVENOUS
  Filled 2011-09-19 (×2): qty 1

## 2011-09-19 MED ORDER — LACTATED RINGERS IV BOLUS (SEPSIS)
1000.0000 mL | Freq: Once | INTRAVENOUS | Status: AC
  Administered 2011-09-19: 1000 mL via INTRAVENOUS

## 2011-09-19 MED ORDER — MAGIC MOUTHWASH
15.0000 mL | Freq: Four times a day (QID) | ORAL | Status: DC | PRN
  Administered 2011-09-19 – 2011-09-21 (×3): 15 mL via ORAL
  Filled 2011-09-19 (×3): qty 15

## 2011-09-19 MED ORDER — HEPARIN SODIUM (PORCINE) 5000 UNIT/ML IJ SOLN
5000.0000 [IU] | Freq: Three times a day (TID) | INTRAMUSCULAR | Status: DC
  Administered 2011-09-19 – 2011-09-26 (×21): 5000 [IU] via SUBCUTANEOUS
  Filled 2011-09-19 (×27): qty 1

## 2011-09-19 MED ORDER — MAGIC MOUTHWASH: 15.0000 mL | Freq: Four times a day (QID) | ORAL | Status: DC | PRN

## 2011-09-19 MED ORDER — ZOLPIDEM TARTRATE 5 MG PO TABS
5.0000 mg | ORAL_TABLET | Freq: Every evening | ORAL | Status: DC | PRN
  Administered 2011-09-21 – 2011-09-24 (×4): 5 mg via ORAL
  Filled 2011-09-19 (×5): qty 1

## 2011-09-19 MED ORDER — CIPROFLOXACIN IN D5W 400 MG/200ML IV SOLN
400.0000 mg | Freq: Two times a day (BID) | INTRAVENOUS | Status: DC
  Administered 2011-09-19 – 2011-09-22 (×7): 400 mg via INTRAVENOUS
  Filled 2011-09-19 (×8): qty 200

## 2011-09-19 MED ORDER — LIP MEDEX EX OINT
1.0000 "application " | TOPICAL_OINTMENT | Freq: Two times a day (BID) | CUTANEOUS | Status: DC
  Administered 2011-09-19 – 2011-09-26 (×14): 1 via TOPICAL
  Filled 2011-09-19: qty 7

## 2011-09-19 MED ORDER — CITALOPRAM HYDROBROMIDE 40 MG PO TABS
40.0000 mg | ORAL_TABLET | Freq: Every day | ORAL | Status: DC
  Administered 2011-09-19 – 2011-09-26 (×8): 40 mg via ORAL
  Filled 2011-09-19 (×10): qty 1

## 2011-09-19 MED ORDER — GUAIFENESIN-DM 100-10 MG/5ML PO SYRP: 15.0000 mL | ORAL_SOLUTION | ORAL | Status: DC | PRN

## 2011-09-19 MED ORDER — HYDROMORPHONE HCL PF 1 MG/ML IJ SOLN
0.5000 mg | INTRAMUSCULAR | Status: DC | PRN
  Administered 2011-09-19: 2 mg via INTRAVENOUS
  Administered 2011-09-19 – 2011-09-21 (×7): 1 mg via INTRAVENOUS
  Filled 2011-09-19 (×6): qty 1
  Filled 2011-09-19: qty 2
  Filled 2011-09-19: qty 1

## 2011-09-19 MED ORDER — PANTOPRAZOLE SODIUM 40 MG IV SOLR
40.0000 mg | Freq: Two times a day (BID) | INTRAVENOUS | Status: DC
  Administered 2011-09-19 – 2011-09-24 (×12): 40 mg via INTRAVENOUS
  Filled 2011-09-19 (×14): qty 40

## 2011-09-19 MED ORDER — METRONIDAZOLE IN NACL 5-0.79 MG/ML-% IV SOLN
500.0000 mg | Freq: Four times a day (QID) | INTRAVENOUS | Status: DC
  Administered 2011-09-19 – 2011-09-22 (×10): 500 mg via INTRAVENOUS
  Filled 2011-09-19 (×14): qty 100

## 2011-09-19 MED ORDER — METOPROLOL TARTRATE 1 MG/ML IV SOLN
5.0000 mg | Freq: Four times a day (QID) | INTRAVENOUS | Status: DC | PRN
  Filled 2011-09-19: qty 5

## 2011-09-19 NOTE — Consult Note (Signed)
Hospital Admission Note Date: 09/19/2011  Patient name: Christian Harrison Medical record number: [id number] Date of birth: Dec 12, 1945 Age: 65 y.o. Gender: male PCP: Christian Mask, MD  Attending physician: Christian Mention, MD  Reason for consult: Management of medical problems  History of Present Illness: [name] is a 65 year old gentleman with a history of colon cancer status post partial colon resection approximately a year and a half ago and then status post chemotherapy. The patient was admitted to the hospital approximately one week ago for hernia repair. According to the patient and his wife he went home was doing well for a few days and then yesterday started having an inability to tolerate oral intake. He's been vomiting ever since and it came back to the hospital for further evaluation and management after speaking with Dr. Michaell Harrison on the phone. In the emergency room the patient was evaluated by the general surgeons is found to have a small bowel obstruction which they're managing. The patient is married is admitted to the Gen. surgery service and they've asked to see [place] to help manage the patient's medical problems. The patient does have a history of atrial fibrillation and has been seen twice by Dr. Carolanne Harrison was placed on Coreg. According to the patient and his wife his heart rates have been well controlled and he was deemed not a candidate for Coumadin and has not been on Coumadin. The patient also has a history of emphysema and is on Spiriva and Symbicort. According to the patient and his wife his emphysema has been quiescent. Scheduled Meds:   . budesonide-formoterol  2 puff Inhalation BID  . carvedilol  6.25 mg Oral BID WC  . ciprofloxacin  400 mg Intravenous Q12H  . citalopram  40 mg Oral Daily  . Flora-Q  1 capsule Oral Daily  . heparin  5,000 Units Subcutaneous Q8H  . lactated ringers  1,000 mL Intravenous Once  . lip balm  1 application Topical BID  . metronidazole  500  mg Intravenous Q6H  .  morphine injection  2 mg Intravenous Once  . ondansetron (ZOFRAN) IV  4 mg Intravenous Once  . ondansetron  4 mg Intravenous Q6H  . pantoprazole (PROTONIX) IV  40 mg Intravenous Q12H  . sodium chloride  500 mL Intravenous Once  . tiotropium  18 mcg Inhalation Daily  . DISCONTD: lip balm  1 application Topical BID   Continuous Infusions:   . dextrose 5% lactated ringers 125 mL/hr at 09/19/11 0747   PRN Meds:.albuterol, alum & mag hydroxide-simeth, bisacodyl, guaiFENesin-dextromethorphan, HYDROmorphone, iohexol, lactated ringers, magic mouthwash, menthol-cetylpyridinium, metoprolol, ondansetron, promethazine, zolpidem, DISCONTD: albuterol, DISCONTD: bisacodyl, DISCONTD: magic mouthwash Allergies: Penicillins Past Medical History  Diagnosis Date  . Hypertension   . Hernia   . Asthma 09-08-11    controlled with inhalers  . Colon cancer 09-08-11    colon ca dx 02/2010. Chemo x 12 sessions  . COPD (chronic obstructive pulmonary disease) 09-08-11    previous smoker  . 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 Incisional hernia s/p open VWH repair w mesh [id number] 09/13/2011   Past Surgical History  Procedure Date  . Hand surgery     LEFT  . Colon surgery 09-08-11    surgery in 6'11  . Dg finger*l* 09-08-11    left index finger-tendon release  . Ventral hernia repair 09/10/2011    Procedure: LAPAROSCOPIC VENTRAL HERNIA;  Surgeon: Christian Mention, MD;  Location: [facility];  Service: General;  Laterality: N/A;  Lysis of Adhessions, Converted to open, with Physio Mesh  . Port-a-cath removal 09/10/2011    Procedure: REMOVAL PORT-A-CATH;  Surgeon: Christian Mention, MD;  Location: [facility];  Service: General;  Laterality: N/A;  Removal Port-a-Cath   Family History  Problem Relation Age of Onset  . Heart disease Mother    History   Social History  . Marital Status: Married    Spouse Name: N/A    Number of Children: N/A  . Years  of Education: N/A   Occupational History  . Not on file.   Social History Main Topics  . Smoking status: Former Smoker -- 1 years  . Smokeless tobacco: Never Used  . Alcohol Use: No  . Drug Use: No  . Sexually Active: Yes   Other Topics Concern  . Not on file   Social History Narrative  . No narrative on file   Review of Systems: A comprehensive review of systems was negative except as noted in HPI. Physical Exam:  Intake/Output Summary (Last 24 hours) at 09/19/11 0857 Last data filed at 09/19/11 0847  Gross per 24 hour  Intake   1200 ml  Output   1225 ml  Net    -25 ml   General: Alert, awake, oriented x3, in no acute distress.  Vital Signs: T 99.3, HR 69, BP 145/72, RR 17, Sats 95% HEENT: [place]/AT PEERL, EOMI Neck: Trachea midline,  no masses, no thyromegaly no JVD, no carotid bruit OROPHARYNX:  Moist, No exudate/ erythema/lesions.  Heart: Regular rate and rhythm, without murmurs, rubs, gallops, PMI non-displaced, no heaves or thrills on palpation.  Lungs: Clear to auscultation, no wheezing or rhonchi noted. No increased vocal fremitus resonant to percussion  Abdomen: Soft, nontender, nondistended, positive bowel sounds, no masses no hepatosplenomegaly noted..  Neuro: No focal neurological deficits noted cranial nerves II through XII grossly intact. DTRs 2+ bilaterally upper and lower extremities. Strength 5 out of 5 in bilateral upper and lower extremities. Musculoskeletal: No warm swelling or erythema around joints, no spinal tenderness noted. Psychiatric: Patient alert and oriented x3, good insight and cognition, good recent to remote recall. Lymph node survey: No cervical axillary or inguinal lymphadenopathy noted.  Lab results:  [facility] 09/18/11 2310  NA 136  K 3.7  CL 99  CO2 26  GLUCOSE 177*  BUN 23  CREATININE 0.74  CALCIUM 10.1  MG --  PHOS --    Basename 09/18/11 2310  AST 22  ALT 31  ALKPHOS 75  BILITOT 0.4  PROT 7.0  ALBUMIN 3.5     Basename 09/18/11 2310  LIPASE 70*  AMYLASE --    Basename 09/18/11 2310  WBC 15.2*  NEUTROABS 13.3*  HGB 14.0  HCT 39.3  MCV 91.2  PLT 325   No results found for this basename: CKTOTAL:3,CKMB:3,CKMBINDEX:3,TROPONINI:3 in the last 72 hours No components found with this basename: POCBNP:3 No results found for this basename: DDIMER:2 in the last 72 hours No results found for this basename: HGBA1C:2 in the last 72 hours No results found for this basename: CHOL:2,HDL:2,LDLCALC:2,TRIG:2,CHOLHDL:2,LDLDIRECT:2 in the last 72 hours No results found for this basename: TSH,T4TOTAL,FREET3,T3FREE,THYROIDAB in the last 72 hours No results found for this basename: VITAMINB12:2,FOLATE:2,FERRITIN:2,TIBC:2,IRON:2,RETICCTPCT:2 in the last 72 hours Imaging results:  Ct Abdomen Pelvis W Contrast  09/19/2011  *RADIOLOGY REPORT*  Clinical Data: Abdominal pain, nausea and vomiting.  8 days postop from ventral hernia repair.  Bilateral abdominal drains in place. White  cell count 15.2.  Previous colon resection and appendectomy. History of colon cancer.  CT ABDOMEN AND PELVIS WITH CONTRAST  Technique:  Multidetector CT imaging of the abdomen and pelvis was performed following the standard protocol during bolus administration of intravenous contrast.  Contrast: OMNIPAQUE IOHEXOL 300 MG/ML IV SOLN  Comparison: 03/25/2011  Findings: Atelectasis in the lung bases.  Small right Bochdalek type diaphragmatic hernia.  The spleen size is normal.  Diffuse low attenuation change in the liver consistent with fatty infiltration. Calcified granuloma in the liver.  The gallbladder, pancreas, and adrenal glands are unremarkable.  The stomach and small bowel are distended.  Postoperative changes with right hemicolectomy and ileocolonic anastomoses.  The distal small bowel and colon are decompressed.  The transition zone is in the anterior mid abdomen underneath the area of postoperative changes.  This likely represents a  obstruction due to adhesions.  In the anterior mid epigastric and periumbilical region, there are postoperative changes with skin clips and infiltration in the subcutaneous fat as well as the underlying anterior abdominal fat .  Tiny gas collections present which likely represent postoperative gas. Drainage catheters are placed in the subcutaneous fat and just deep to the rectus abdominous muscles.  There is no significant residual fluid collection around the drainage catheters.  There is a small amount of free fluid scattered throughout the mesentery.  This could represent postoperative fluid infiltration in the subcutaneous fat and mesenteric fat may represent edema or fat necrosis.  Fistulas are not excluded.  Pelvis:  No free or loculated pelvic fluid collections.  No inflammatory changes involving the sigmoid colon.  The bladder wall is not significantly thickened.  The prostate gland is not enlarged.  Calcified phleboliths are present.  IMPRESSION: Interval postoperative changes consistent with abdominal wall hernia repair.  Surgical drains are present with edema, scarring, or inflammatory stranding in the subcutaneous fat and mesenteric fat.  No significant residual loculated fluid collection is demonstrated in the postoperative area but there is scattered fluid in the mesentery.  Tiny mesenteric gas bubbles may represent postoperative change.  High-grade small bowel obstruction with transition zone below the surgical area likely to represent adhesions.  Fatty infiltration of the liver.  Original Report Authenticated By: Marlon Pel, M.D.   Dg Abd 2 Views  09/19/2011  *RADIOLOGY REPORT*  Clinical Data: Partial small bowel obstruction, follow-up  ABDOMEN - 2 VIEW  Comparison: CT abdomen and pelvis of 09/19/2011 and chest with acute abdomen of 09/18/2011  Findings: There are still significantly dilated loops of what appears to be small bowel within the left upper quadrant consistent with partial  small bowel obstruction.  No colonic bowel gas is seen.  On the decubitus image no free air is noted.  A surgical drain is present and an NG tube is present coiled in the fundus of the stomach.  IMPRESSION: Little change in partial small bowel obstructive pattern.  No free air.  Original Report Authenticated By: Juline Patch, M.D.   Dg Abd Acute W/chest  09/18/2011  *RADIOLOGY REPORT*  Clinical Data: Nausea and vomiting.  Abdominal pain.  ACUTE ABDOMEN SERIES (ABDOMEN 2 VIEW & CHEST 1 VIEW)  Comparison: CT scan from 03/25/2011.  X-rays from 04/18/2010  Findings: Lung volumes are low.  There is some basilar atelectasis. The cardiopericardial silhouette is enlarged.  No overt airspace pulmonary edema or focal lung consolidation.  Right-sided decubitus view of the abdomen shows no evidence for intraperitoneal free air.  Supine film shows some  dilated small bowel the left abdomen measuring up to 4.3 cm in diameter.  There appears to be some air in nondilated right colon.  Two surgical drains are coiled over the central abdomen.  Midline skin staples are evident.  IMPRESSION: Cardiomegaly without acute cardiopulmonary process.  There is some minimal basilar atelectasis.  No intraperitoneal free air.  There is some dilated small bowel in the left abdomen with gas in nondilated bowel loops on the right.  Imaging features may be related to ileus or evolving obstruction.  Original Report Authenticated By: ERIC A. MANSELL, M.D.   Other results: EKG: normal EKG.   Patient Active Hospital Problem List: SBO (small bowel obstruction) (09/19/2011)   Assessment: Defer to [facility]  Incisional hernia s/p open VWH repair w mesh [id number] (09/13/2011)   Assessment: Defer to [facility]   Atrial fibrillation with RVR (09/13/2011)   Assessment: Agreee with Lopressor. Hold coreg  Asthma (09/16/2011)   Assessment: Quiscent. Continue Spiriva and Symbicort. Albuterol PRN  Hypertension (09/16/2011)   Assessment: Treat with Lopressor  for now. Hold Lisinopril.  Thank you for inviting [place] to participate in the care of this pleasant gentleman we will follow.   Antionne Enrique A. 09/19/2011, 8:57 AM

## 2011-09-19 NOTE — ED Notes (Signed)
Pharmacy called and stated pt with allergy to pcn. Called md gross and stated he would adjust pt medicaiton.

## 2011-09-19 NOTE — ED Notes (Signed)
Family at bedside. 

## 2011-09-19 NOTE — ED Notes (Signed)
Patient is resting comfortably. 

## 2011-09-19 NOTE — Progress Notes (Addendum)
I have interviewed and examined Christian Harrison this morning. Xrays reviewed and I have conferred with Dr. Michaell Cowing.  Patient is not toxic and abdomen is not tender. Wound looks clean.  Agree with admission, NG tube, enemas  and bowel rest, treat for early post op partial SBO.  ? Will need TNA?   Assessment:  Partial SBO, post op, no evidence for compromised bowel. Hopefully this will resolve without surgical intervention.  S/p recent open LOA and repair VIH with inlay mesh.  S/p right colectomy for cancer (2011)  S/p adjuvant chemotherapy  Asthma  HTN   A. fib. With RVR, new onsert last weekend, resolved. On Coreg as outpatient.  Followed by Dr. Armanda Magic.  DVT prophylaxis.  I have asked [facility] to consult and manage his medical problems.  [name] 09/19/2011 6:47 AM

## 2011-09-19 NOTE — H&P (Signed)
NAMEBRAD, [name] NO.:  [id number]  MEDICAL RECORD NO.:  [id number]  LOCATION:  WA05                         FACILITY:  [facility]  PHYSICIAN:  Ardeth Sportsman, MD     DATE OF BIRTH:  1945/10/28  DATE OF ADMISSION:  09/18/2011 DATE OF DISCHARGE:                             HISTORY & PHYSICAL   PRIMARY CARE PHYSICIAN:  Windle Guard, MD  MEDICAL ONCOLOGIST:  Leighton Roach. Truett Perna, MD  SURGEON:  Angelia Mould. Derrell Lolling, MD  REASON FOR ADMISSION:  Nausea, vomiting, abdominal pain, probable postoperative bowel obstruction versus ileus.  HISTORY OF PRESENT ILLNESS:  [name] is a 65 year old obese male who is a survivor of rectal cancer, status post lap converted to open right colectomy with extensive lysis of adhesions in June 2011.  He recovered that.  He got post-adjuvant chemotherapy.  He recovered.  He developed incisional hernia.  He underwent a lap converted to open, extensive lysis of adhesions, and ventral hernia repair with anterior abdominal mesh and drains earlier this month.  His postoperative course was delayed by a significant postoperative ileus.  However, he gradually advanced on his diet and was able to be discharged home on postoperative day #7.  That was on the 12th.  Starting the following morning, the wife noted the patient was having nausea and vomiting with poor tolerance to any intake.  She tried to transition him to liquids, he still would occasionally throw that up. He had no longer any flatus.  He had had one bowel movement.  He was having a significant amount of nausea.  There were running out of the nausea medicines and the pharmacy was closed.  He was having only marginal control of abdominal pain with oral narcotics.  After numerous ways of trying to explain and encourage, I finally encouraged them to come to the emergency room.  Workups concerning for a small bowel obstruction with a significant transition zone in the midabdomen by CAT  scan.  Dr. __________ recommended admission and I concur.  The patient notes since he has been in, his pain and nausea are under control.  He has refused an NG tube. He has refused an enema.  He has refused to have his staples removed.  PAST MEDICAL HISTORY: 1. 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 2. Hypertension. 3. Atrial fibrillation with rapid ventricular response rate, followed     by Dr. Armanda Magic and hospital consult, last admission earlier     this month. 4. Asthma.  PAST SURGICAL HISTORY:  As noted above.  MEDICATIONS:  Noted in the system.  ALLERGIES:  Noted in the system.  SOCIAL HISTORY:  He lives and married in a stable relationship.  Former smoker for about 30 years, but not in the past several years.  Denies alcohol or other drug use.  FAMILY HISTORY:  As noted in the system with both parents deceased of unknown etiology.  Mother did have heart disease though.  REVIEW OF SYSTEMS:  On the chart.  GENERAL:  No fevers, chills, or sweats.  His weight has  been stable.  EYES/ENT:  Negative.  CARDIAC:  No chest pain.  His wife wondered if he had a few palpitations, but he denies any rapid heart rate.  CHEST:  He does not productive cough, cold, or flus.  ABDOMEN:  No hematochezia or melena.  No flatus or bowel movement.  No dysphasia to solid or liquids.  No heartburn or reflux. No significant belching.  HEM/LYMPH/ALLERGIC:  Otherwise negative. HEPATIC/RENAL/ENDOCRINE:  Otherwise negative. MUSCULOSKELETAL/NEUROLOGIC/PSYCH:  Otherwise negative.  PHYSICAL EXAMINATION:  VITAL SIGNS:  Temperature was 97.6, pulse in the 90s, respirations 18, blood pressure 161/90. GENERAL:  He is a well-developed, well-nourished male, obese, lying in bed, in no acute distress. PSYCH:  He is a little withdrawn, not too talkative, but then opens up near to the end of the case.  No evidence of  any dementia, delirium, psychosis, or paranoia. NEUROLOGICAL:  Cranial nerves II-XII intact.  Hand grips 5/5, equal, symmetrical.  No resting or tension tremors. EYES:  Pupils are equal, round, reactive to light.  Extraocular movements are intact.  Sclerae are nonicteric or injected. NECK:  Supple.  No masses.  Trachea midline. HEART:  Mostly regular, not particularly tachycardic.  No major murmurs, clicks, or rubs. CHEST:  No pain on rib or sternal compression. BREASTS:  No nipple discharge or masses. HEENT:  His mucous membranes are dry, but nasopharynx and oropharynx are clear. ABDOMEN:  Obese, but soft.  He has a midline incision with staples in place, appeared to be well closed.  He has some subcutaneous drains, Blake type drains.  Wife notes that he has put out about 50 to 70 cc a day on both sides.  He has some mild discomfort, but no peritonitis.  No evidence of any wound infection. GU:  No inguinal hernias.  No penile discharge. RECTAL:  Deferred. MUSCULOSKELETAL:  He is moving his upper and lower extremities rather well within normal function and normal range of motion at his shoulders, elbows, wrists, as well as hips, knees, and ankles. SKIN:  No petechiae or purpura.  No other sores or lesions. LYMPH:  No head, neck, axillary, or groin lymphadenopathy.  LAB STUDIES:  His laboratory values reveal a white count of 15.2, hemoglobin of 14.  His creatinine is 0.74.  X-RAYS:  Show increased anterior abdominal gas and a pair of CT scan shows dilated proximal small bowel with distal decompression concerning for bowel obstruction.  Radiology concern of a high-grade transition zone.  There is a scant, little bit of fluid and gas in the subcu area consistent with a week postop from major open abdominal surgery.  No strong evidence of perforation or appendicitis or recurrent hernia or diverticulitis or other abnormalities.  ASSESSMENT AND PLAN:  Morbidly obese male, postop day 8  from a laparotomy converted to open, extensive lysis of adhesions, and ventral hernia repair with postoperative ileus; now with nausea, vomiting, abdominal pain, and obstipation and x-ray findings concerning for partial small bowel obstruction versus ileus, I favor the former.  1. Admit. 2. IV fluids. 3. NG tube. 4. IV Cipro and Flagyl.  I would prefer Zosyn, but given his     penicillin allergy, we will defer to that. 5. Continue drains. 6. Remove staples. 7. Aggressive palliation of his nausea. 8. Follow up films. I had an extensive discussion with the wife and the patient.  I explained the pathophysiology of small bowel obstruction and the differential diagnosis.  I tried to explain, in numerous and different fashions, the justification for  things like the NG tube, the need for admission, considering an enema, etc.  I think they began to comprehend the importance of the recommendations.  The patient initially was resistant to most of these recommendations, but after talking to him and now that Dr. Derrell Lolling is here and is about to discuss with him as well, I think they are more open to understand the importance of trying nonoperative management and avoiding an immediate reoperation in somebody who already has significant adhesions and is in a period of increasing inflammation that makes risk of fistula, injuries, and hernia recurrence much higher. I am hopeful that they will accept our recommendations and we will follow closely.  If he develops any worsening cardiac issues, we will involve Cardiology again.  We will put him on IV metoprolol and hold his oral Coreg until he is taking p.o. better.  Continue his nebulized inhalers for his usual pulmonary toilet regimen.     Ardeth Sportsman, MD     SCG/MEDQ  D:  09/19/2011  T:  09/19/2011  Job:  [id number]

## 2011-09-19 NOTE — ED Notes (Signed)
Vital signs stable. 

## 2011-09-20 ENCOUNTER — Inpatient Hospital Stay (HOSPITAL_COMMUNITY): Payer: Medicare Other

## 2011-09-20 DIAGNOSIS — E876 Hypokalemia: Secondary | ICD-10-CM

## 2011-09-20 LAB — CBC
MCV: 92.9 fL (ref 78.0–100.0)
Platelets: 277 10*3/uL (ref 150–400)
RBC: 3.96 MIL/uL — ABNORMAL LOW (ref 4.22–5.81)
WBC: 10.5 10*3/uL (ref 4.0–10.5)

## 2011-09-20 LAB — BASIC METABOLIC PANEL
CO2: 29 mEq/L (ref 19–32)
Chloride: 98 mEq/L (ref 96–112)
GFR calc Af Amer: >90 mL/min (ref >90)
Potassium: 3.1 mEq/L — ABNORMAL LOW (ref 3.5–5.1)
Sodium: 135 mEq/L (ref 135–145)

## 2011-09-20 MED ORDER — POTASSIUM CHLORIDE 10 MEQ/100ML IV SOLN
10.0000 meq | INTRAVENOUS | Status: AC
  Administered 2011-09-20 (×2): 10 meq via INTRAVENOUS
  Filled 2011-09-20 (×3): qty 100

## 2011-09-20 MED ORDER — KCL IN DEXTROSE-NACL 20-5-0.9 MEQ/L-%-% IV SOLN
INTRAVENOUS | Status: DC
  Administered 2011-09-20 – 2011-09-21 (×2): via INTRAVENOUS
  Filled 2011-09-20 (×5): qty 1000

## 2011-09-20 MED ORDER — POTASSIUM CHLORIDE 2 MEQ/ML IV SOLN: INTRAVENOUS | Status: DC

## 2011-09-20 NOTE — Progress Notes (Signed)
Subjective: Passed a little gas.  No BM.  Walking.  Some pain and nausea.  Objective: Vital signs in last 24 hours: Temp:  [97.7 F (36.5 C)-99.2 F (37.3 C)] 98.9 F (37.2 C) (12/15 0553) Pulse Rate:  [65-72] 70  (12/15 0553) Resp:  [16-20] 20  (12/15 0553) BP: (134-152)/(71-83) 141/83 mmHg (12/15 0553) SpO2:  [94 %-100 %] 94 % (12/15 0553) FiO2 (%):  [21 %] 21 % (12/15 0151) Weight:  [214 lb (97.07 kg)] 214 lb (97.07 kg) (12/14 1120) Last BM Date: 09/18/11  Intake/Output from previous day: 12/14 0701 - 12/15 0700 In: 3652.1 [I.V.:3652.1] Out: 3165 [Urine:1475; Emesis/NG output:550; Drains:25] Intake/Output this shift:    PE: Abd- soft, few BS, incision c/d/i, serous drain output  Lab Results:   Basename 09/20/11 0413 09/18/11 2310  WBC 10.5 15.2*  HGB 12.7* 14.0  HCT 36.8* 39.3  PLT 277 325   BMET  Basename 09/20/11 0413 09/18/11 2310  NA 135 136  K 3.1* 3.7  CL 98 99  CO2 29 26  GLUCOSE 125* 177*  BUN 16 23  CREATININE 0.87 0.74  CALCIUM 8.8 10.1   PT/INR No results found for this basename: LABPROT:2,INR:2 in the last 72 hours Comprehensive Metabolic Panel:    Component Value Date/Time   NA 135 09/20/2011 0413   K 3.1* 09/20/2011 0413   CL 98 09/20/2011 0413   CO2 29 09/20/2011 0413   BUN 16 09/20/2011 0413   CREATININE 0.87 09/20/2011 0413   GLUCOSE 125* 09/20/2011 0413   CALCIUM 8.8 09/20/2011 0413   AST 22 09/18/2011 2310   ALT 31 09/18/2011 2310   ALKPHOS 75 09/18/2011 2310   BILITOT 0.4 09/18/2011 2310   PROT 7.0 09/18/2011 2310   ALBUMIN 3.5 09/18/2011 2310     Studies/Results:  12/15 Xrays pending Ct Abdomen Pelvis W Contrast  09/19/2011  *RADIOLOGY REPORT*  Clinical Data: Abdominal pain, nausea and vomiting.  8 days postop from ventral hernia repair.  Bilateral abdominal drains in place. White cell count 15.2.  Previous colon resection and appendectomy. History of colon cancer.  CT ABDOMEN AND PELVIS WITH CONTRAST  Technique:   Multidetector CT imaging of the abdomen and pelvis was performed following the standard protocol during bolus administration of intravenous contrast.  Contrast: OMNIPAQUE IOHEXOL 300 MG/ML IV SOLN  Comparison: 03/25/2011  Findings: Atelectasis in the lung bases.  Small right Bochdalek type diaphragmatic hernia.  The spleen size is normal.  Diffuse low attenuation change in the liver consistent with fatty infiltration. Calcified granuloma in the liver.  The gallbladder, pancreas, and adrenal glands are unremarkable.  The stomach and small bowel are distended.  Postoperative changes with right hemicolectomy and ileocolonic anastomoses.  The distal small bowel and colon are decompressed.  The transition zone is in the anterior mid abdomen underneath the area of postoperative changes.  This likely represents a obstruction due to adhesions.  In the anterior mid epigastric and periumbilical region, there are postoperative changes with skin clips and infiltration in the subcutaneous fat as well as the underlying anterior abdominal fat .  Tiny gas collections present which likely represent postoperative gas. Drainage catheters are placed in the subcutaneous fat and just deep to the rectus abdominous muscles.  There is no significant residual fluid collection around the drainage catheters.  There is a small amount of free fluid scattered throughout the mesentery.  This could represent postoperative fluid infiltration in the subcutaneous fat and mesenteric fat may represent edema or fat necrosis.  Fistulas are not excluded.  Pelvis:  No free or loculated pelvic fluid collections.  No inflammatory changes involving the sigmoid colon.  The bladder wall is not significantly thickened.  The prostate gland is not enlarged.  Calcified phleboliths are present.  IMPRESSION: Interval postoperative changes consistent with abdominal wall hernia repair.  Surgical drains are present with edema, scarring, or inflammatory stranding in  the subcutaneous fat and mesenteric fat.  No significant residual loculated fluid collection is demonstrated in the postoperative area but there is scattered fluid in the mesentery.  Tiny mesenteric gas bubbles may represent postoperative change.  High-grade small bowel obstruction with transition zone below the surgical area likely to represent adhesions.  Fatty infiltration of the liver.  Original Report Authenticated By: Marlon Pel, M.D.   Dg Abd 2 Views  09/19/2011  *RADIOLOGY REPORT*  Clinical Data: Partial small bowel obstruction, follow-up  ABDOMEN - 2 VIEW  Comparison: CT abdomen and pelvis of 09/19/2011 and chest with acute abdomen of 09/18/2011  Findings: There are still significantly dilated loops of what appears to be small bowel within the left upper quadrant consistent with partial small bowel obstruction.  No colonic bowel gas is seen.  On the decubitus image no free air is noted.  A surgical drain is present and an NG tube is present coiled in the fundus of the stomach.  IMPRESSION: Little change in partial small bowel obstructive pattern.  No free air.  Original Report Authenticated By: Juline Patch, M.D.   Dg Abd Acute W/chest  09/18/2011  *RADIOLOGY REPORT*  Clinical Data: Nausea and vomiting.  Abdominal pain.  ACUTE ABDOMEN SERIES (ABDOMEN 2 VIEW & CHEST 1 VIEW)  Comparison: CT scan from 03/25/2011.  X-rays from 04/18/2010  Findings: Lung volumes are low.  There is some basilar atelectasis. The cardiopericardial silhouette is enlarged.  No overt airspace pulmonary edema or focal lung consolidation.  Right-sided decubitus view of the abdomen shows no evidence for intraperitoneal free air.  Supine film shows some dilated small bowel the left abdomen measuring up to 4.3 cm in diameter.  There appears to be some air in nondilated right colon.  Two surgical drains are coiled over the central abdomen.  Midline skin staples are evident.  IMPRESSION: Cardiomegaly without acute  cardiopulmonary process.  There is some minimal basilar atelectasis.  No intraperitoneal free air.  There is some dilated small bowel in the left abdomen with gas in nondilated bowel loops on the right.  Imaging features may be related to ileus or evolving obstruction.  Original Report Authenticated By: ERIC A. MANSELL, M.D.    Anti-infectives: Anti-infectives     Start     Dose/Rate Route Frequency Ordered Stop   09/19/11 0415   ciprofloxacin (CIPRO) IVPB 400 mg        400 mg 200 mL/hr over 60 Minutes Intravenous Every 12 hours 09/19/11 0412     09/19/11 0415   metroNIDAZOLE (FLAGYL) IVPB 500 mg        500 mg 100 mL/hr over 60 Minutes Intravenous Every 6 hours 09/19/11 1610            Assessment Principal Problem:  *SBO (small bowel obstruction)-still with bilious ng output; passed a little gas Active Problems:  Incisional hernia s/p open VWH repair w mesh [id number]  Atrial fibrillation with RVR  Asthma  Hypertension  Hypokalemia    LOS: 2 days   Plan: Continue ng decompression.  Check x-rays. Correct hypokalemia.   Trichelle Lehan J 09/20/2011

## 2011-09-20 NOTE — Progress Notes (Signed)
Patient refuse completion of potassium infusions re: c/o burning. New IV site started but patient still refuse potassium even at low infusion rate. Total of 1.5 bags infused out of total of 3 ordered.  Dr. Daphine Deutscher returned page and gave telepone order to d/c potassium IV.

## 2011-09-21 ENCOUNTER — Inpatient Hospital Stay (HOSPITAL_COMMUNITY): Payer: Medicare Other

## 2011-09-21 LAB — BASIC METABOLIC PANEL
CO2: 27 mEq/L (ref 19–32)
Chloride: 103 mEq/L (ref 96–112)
Glucose, Bld: 103 mg/dL — ABNORMAL HIGH (ref 70–99)
Potassium: 3.4 mEq/L — ABNORMAL LOW (ref 3.5–5.1)
Sodium: 137 mEq/L (ref 135–145)

## 2011-09-21 MED ORDER — KCL IN DEXTROSE-NACL 40-5-0.9 MEQ/L-%-% IV SOLN
INTRAVENOUS | Status: AC
  Administered 2011-09-21: 1000 mL via INTRAVENOUS
  Administered 2011-09-22 (×2): via INTRAVENOUS
  Filled 2011-09-21 (×4): qty 1000

## 2011-09-21 MED ORDER — POTASSIUM CHLORIDE 10 MEQ/100ML IV SOLN
10.0000 meq | INTRAVENOUS | Status: AC
  Filled 2011-09-21 (×22): qty 100

## 2011-09-21 NOTE — Progress Notes (Signed)
  Subjective: Passing some gas but no BM.  Feels a little distended.  Objective: Vital signs in last 24 hours: Temp:  [98.7 F (37.1 C)-99.2 F (37.3 C)] 98.9 F (37.2 C) (12/16 0600) Pulse Rate:  [71-84] 84  (12/16 0600) Resp:  [20] 20  (12/16 0600) BP: (157-165)/(90-92) 158/90 mmHg (12/16 0600) SpO2:  [94 %-95 %] 95 % (12/16 0600) Last BM Date: 09/18/11  Intake/Output from previous day: 10-17-23 0701 - 12/16 0700 In: 3837.5 [I.V.:2237.5; IV Piggyback:1600] Out: 2035 [Urine:1800; Emesis/NG output:200; Drains:35] Intake/Output this shift:    PE: abd-soft with slight distension, active BS, incision c/d/i, serous drain output  Lab Results:   Basename 10/17/11 0413 09/18/11 2310  WBC 10.5 15.2*  HGB 12.7* 14.0  HCT 36.8* 39.3  PLT 277 325   BMET  Basename 09/21/11 0450 10/17/11 0413  NA 137 135  K 3.4* 3.1*  CL 103 98  CO2 27 29  GLUCOSE 103* 125*  BUN 12 16  CREATININE 0.82 0.87  CALCIUM 9.0 8.8   PT/INR No results found for this basename: LABPROT:2,INR:2 in the last 72 hours Comprehensive Metabolic Panel:    Component Value Date/Time   NA 137 09/21/2011 0450   K 3.4* 09/21/2011 0450   CL 103 09/21/2011 0450   CO2 27 09/21/2011 0450   BUN 12 09/21/2011 0450   CREATININE 0.82 09/21/2011 0450   GLUCOSE 103* 09/21/2011 0450   CALCIUM 9.0 09/21/2011 0450   AST 22 09/18/2011 2310   ALT 31 09/18/2011 2310   ALKPHOS 75 09/18/2011 2310   BILITOT 0.4 09/18/2011 2310   PROT 7.0 09/18/2011 2310   ALBUMIN 3.5 09/18/2011 2310     Studies/Results: Dg Abd 2 Views  10/17/11  *RADIOLOGY REPORT*  Clinical Data: Follow-up small bowel obstruction.  ABDOMEN - 2 VIEW  Comparison: 09/19/2011.  Findings: The NG tube is stable.  There are scattered air filled small bowel loops with air-fluid levels.  Air and contrast is noted in the colon.  Overall V amount of air in the small bowel has decreased.  No free air. Persistent bibasilar atelectasis.  IMPRESSION: Improving  partial small bowel obstruction bowel gas pattern.  Original Report Authenticated By: P. Loralie Champagne, M.D.    Anti-infectives: Anti-infectives     Start     Dose/Rate Route Frequency Ordered Stop   09/19/11 0415   ciprofloxacin (CIPRO) IVPB 400 mg        400 mg 200 mL/hr over 60 Minutes Intravenous Every 12 hours 09/19/11 0412     09/19/11 0415   metroNIDAZOLE (FLAGYL) IVPB 500 mg        500 mg 100 mL/hr over 60 Minutes Intravenous Every 6 hours 09/19/11 1610            Assessment Principal Problem:  *SBO (small bowel obstruction)-partial:  Still with bilious output from ng Active Problems:  Incisional hernia s/p open VWH repair w mesh [id number]  Atrial fibrillation with RVR  Asthma  Hypertension  Hypokalemia-improved   LOS: 3 days   Plan:  Increase KCL in IVF as he did not tolerate KCL runs; check abdominal xrays.   Srihan Brutus J 09/21/2011

## 2011-09-21 NOTE — Progress Notes (Signed)
Subjective: Patient without any significant complaints today except stating that he still has not had a bowel movement. I reviewed the patient's blood sugars which are within normal limits. Also reviewed the patient's blood pressures and heart rates which are all well controlled.   Objective: VITALS: Temp 98.9, HR 84, BP 150/90, RR 20, O2 sats 95% on room air. HEENT:   Mucosa moist no exudate erythema lesions noted. LUNGS: Clear to auscultation no wheezing or rhonchi COR:  S1 and S2 normal no murmurs rubs or gallops noted PMI is nondisplaced no heaves or thrills on palpation ABD:  JP drains are in place. Abdomen is mildly distended soft mild diffuse tenderness.  EXT:  No clubbing cyanosis or edema.    LABS: Potassium 3.4  ASSESSMENT/PLAN:  Hypokalemia Assessment: Patient is presently n.p.o. and has marginally low potassiums. However due to the fact that the patient still has an NG tube in his intakes that his potassium stores will go down even further. The patient apparently had some burning with IV potassium yesterday and has been refusing IV potassium. We'll tentatively accepted again today. As historically to be repleted.  Atrial fibrillation with RVR (09/13/2011) Assessment: HR controlled. Continue with Lopressor.   Asthma (09/16/2011) Assessment: Quiscent.  Continue Spiriva and Symbicort. Albuterol PRN  Hypertension (09/16/2011) Assessment: Treat with Lopressor for now. Hold Lisinopril.  We'll sign off. Please call if any further questions.  MATTHEWS,MICHELLE A. PAGER # [phone]

## 2011-09-22 ENCOUNTER — Encounter (INDEPENDENT_AMBULATORY_CARE_PROVIDER_SITE_OTHER): Payer: Medicare Other | Admitting: General Surgery

## 2011-09-22 ENCOUNTER — Inpatient Hospital Stay (HOSPITAL_COMMUNITY): Payer: Medicare Other

## 2011-09-22 LAB — COMPREHENSIVE METABOLIC PANEL
Alkaline Phosphatase: 72 U/L (ref 39–117)
BUN: 9 mg/dL (ref 6–23)
CO2: 24 mEq/L (ref 19–32)
Chloride: 104 mEq/L (ref 96–112)
Creatinine, Ser: 0.82 mg/dL (ref 0.50–1.35)
GFR calc non Af Amer: >90 mL/min (ref >90)
Glucose, Bld: 113 mg/dL — ABNORMAL HIGH (ref 70–99)
Potassium: 3.8 mEq/L (ref 3.5–5.1)
Total Bilirubin: 0.7 mg/dL (ref 0.3–1.2)

## 2011-09-22 LAB — CBC
HCT: 40 % (ref 39.0–52.0)
Hemoglobin: 14.3 g/dL (ref 13.0–17.0)
MCV: 90.7 fL (ref 78.0–100.0)
RBC: 4.41 MIL/uL (ref 4.22–5.81)
WBC: 10.7 10*3/uL — ABNORMAL HIGH (ref 4.0–10.5)

## 2011-09-22 LAB — PREALBUMIN: Prealbumin: 15.8 mg/dL — ABNORMAL LOW (ref 17.0–34.0)

## 2011-09-22 MED ORDER — HYDROMORPHONE HCL PF 1 MG/ML IJ SOLN
0.5000 mg | INTRAMUSCULAR | Status: DC | PRN
  Administered 2011-09-22: 1 mg via INTRAVENOUS
  Filled 2011-09-22: qty 1

## 2011-09-22 MED ORDER — INSULIN ASPART 100 UNIT/ML ~~LOC~~ SOLN
0.0000 [IU] | Freq: Four times a day (QID) | SUBCUTANEOUS | Status: DC
  Administered 2011-09-23 – 2011-09-24 (×5): 2 [IU] via SUBCUTANEOUS
  Filled 2011-09-22: qty 3

## 2011-09-22 MED ORDER — FAT EMULSION 20 % IV EMUL
240.0000 mL | INTRAVENOUS | Status: AC
  Administered 2011-09-22: 240 mL via INTRAVENOUS
  Filled 2011-09-22: qty 250

## 2011-09-22 MED ORDER — TRACE MINERALS CR-CU-MN-SE-ZN 10-1000-500-60 MCG/ML IV SOLN
INTRAVENOUS | Status: AC
  Administered 2011-09-22: 18:00:00 via INTRAVENOUS
  Filled 2011-09-22: qty 1000

## 2011-09-22 MED ORDER — MAGIC MOUTHWASH W/LIDOCAINE
5.0000 mL | Freq: Four times a day (QID) | ORAL | Status: DC | PRN
  Filled 2011-09-22: qty 5

## 2011-09-22 MED ORDER — PHENOL 1.4 % MT LIQD
1.0000 | OROMUCOSAL | Status: DC | PRN
  Filled 2011-09-22 (×2): qty 177

## 2011-09-22 MED ORDER — KCL IN DEXTROSE-NACL 40-5-0.9 MEQ/L-%-% IV SOLN
INTRAVENOUS | Status: DC
  Administered 2011-09-22 – 2011-09-24 (×4): via INTRAVENOUS
  Administered 2011-09-25: 20 mL/h via INTRAVENOUS
  Filled 2011-09-22 (×7): qty 1000

## 2011-09-22 MED ORDER — TRAVASOL 10 % IV SOLN: INTRAVENOUS | Status: DC

## 2011-09-22 MED ORDER — MAGIC MOUTHWASH W/LIDOCAINE: 5.0000 mL | ORAL | Status: DC | PRN

## 2011-09-22 NOTE — Progress Notes (Signed)
Subjective: Patient states that he feels very good. He is passing lots of flatus, but no stool. He denies nausea he states that he is very hungry. He has no pain and has not taken any pain medicine in the last 24 hours.  NG drainage was 450 cc yesterday, and 200 cc the day before. He is eating ice chips. X-rays from this morning are pending.  Jackson-Pratt drainage has almost stopped and his thin and serosanguineous.  Morning laboratories look good. Potassium 3.8. WBC 10.7.  Objective: Vital signs in last 24 hours: Temp:  [97.8 F (36.6 C)-98.9 F (37.2 C)] 98.9 F (37.2 C) (12/17 0500) Pulse Rate:  [70-77] 70  (12/17 0500) Resp:  [18-20] 18  (12/17 0500) BP: (137-158)/(80-90) 137/80 mmHg (12/17 0500) SpO2:  [93 %-96 %] 96 % (12/17 0500) Last BM Date: 09/18/11  Intake/Output from previous day: 12/16 0701 - 12/17 0700 In: 3266.7 [I.V.:2466.7; IV Piggyback:800] Out: 3201 [Urine:2725; Emesis/NG output:450; Drains:26] Intake/Output this shift: Total I/O In: 1826.7 [I.V.:1026.7; IV Piggyback:800] Out: 2176 [Urine:2000; Emesis/NG output:150; Drains:26]  Exam  Constitutional: Alert, cooperative, in no distress. Lungs: Clear to auscultation bilaterally. Abdomen: Remarkably soft, nontender, active bowel sounds, nondistended . A little protuberant. Not tympanitic. Midline incision very clean and looks normal. Drainage is very thin with minimal output. Essentially a benign physical exam.  Lab Results: otassium is now up to 3.8. Results for orders placed during the hospital encounter of 09/18/11 (from the past 24 hour(s))  CBC     Status: Abnormal   Collection Time   09/22/11  4:32 AM      Component Value Range   WBC 10.7 (*) 4.0 - 10.5 (K/uL)   RBC 4.41  4.22 - 5.81 (MIL/uL)   Hemoglobin 14.3  13.0 - 17.0 (g/dL)   HCT 04.5  40.9 - 81.1 (%)   MCV 90.7  78.0 - 100.0 (fL)   MCH 32.4  26.0 - 34.0 (pg)   MCHC 35.8  30.0 - 36.0 (g/dL)   RDW 91.4  78.2 - 95.6 (%)   Platelets 290   150 - 400 (K/uL)  COMPREHENSIVE METABOLIC PANEL     Status: Abnormal   Collection Time   09/22/11  4:32 AM      Component Value Range   Sodium 137  135 - 145 (mEq/L)   Potassium 3.8  3.5 - 5.1 (mEq/L)   Chloride 104  96 - 112 (mEq/L)   CO2 24  19 - 32 (mEq/L)   Glucose, Bld 113 (*) 70 - 99 (mg/dL)   BUN 9  6 - 23 (mg/dL)   Creatinine, Ser 2.13  0.50 - 1.35 (mg/dL)   Calcium 9.3  8.4 - 08.6 (mg/dL)   Total Protein 6.1  6.0 - 8.3 (g/dL)   Albumin 3.1 (*) 3.5 - 5.2 (g/dL)   AST 18  0 - 37 (U/L)   ALT 21  0 - 53 (U/L)   Alkaline Phosphatase 72  39 - 117 (U/L)   Total Bilirubin 0.7  0.3 - 1.2 (mg/dL)   GFR calc non Af Amer >90  >90 (mL/min)   GFR calc Af Amer >90  >90 (mL/min)     Studies/Results: @RISRSLT24 @     . budesonide-formoterol  2 puff Inhalation BID  . carvedilol  6.25 mg Oral BID WC  . citalopram  40 mg Oral Daily  . Flora-Q  1 capsule Oral Daily  . heparin  5,000 Units Subcutaneous Q8H  . lip balm  1 application Topical BID  .  pantoprazole (PROTONIX) IV  40 mg Intravenous Q12H  . potassium chloride  10 mEq Intravenous Q1 Hr x 2  . tiotropium  18 mcg Inhalation Daily  . DISCONTD: ciprofloxacin  400 mg Intravenous Q12H  . DISCONTD: metronidazole  500 mg Intravenous Q6H     Assessment/Plan: s/p OPEN REPAIR VENTRAL HERNIA WITH MESH  POST OP SBO  Early post op partial small bowel obstruction. Since he is doing so well symptomatically and by physical exam, I am inclined to continue to treat him conservatively. There is no evidence of compromised bowel. We will check his abdominal x-rays. I told him that if he does not open up there is a possibility he might have to be reexplored, but hopefully we can avoid that.  Protein calorie malnutrition, will insert PICC line and begin TNA today.  Discontinue antibiotics. There doesn't seem to be any indication for this.  Soapsuds enema today.  Decrease analgesic dose. He is not requiring any analgesics  anyway.  Discontinue drains.  Check electrolytes tomorrow  Asthma. Stable  Hypertension. Stable.  Hypokalemia. Improved.  Atrial fibrillation with rapid ventricular response, resolved, continue Lopressor.  DVT prophylaxis. On subcutaneous heparin.   LOS: 4 days    Chanci Ojala M 09/22/2011  . .prob

## 2011-09-22 NOTE — Progress Notes (Signed)
PARENTERAL NUTRITION CONSULT NOTE - INITIAL  Pharmacy Consult for TNA Indication: SBO s/p hernia repair  Allergies  Allergen Reactions  . Penicillins Anaphylaxis and Other (See Comments)    Seizures    Patient Measurements: Height: 5\' 10"  (177.8 cm) Weight: 214 lb (97.07 kg) IBW/kg (Calculated) : 73  Adjusted Body Weight: 80 kg Usual Weight:   Vital Signs: Temp: 98.9 F (37.2 C) (12/17 0500) Temp src: Oral (12/17 0500) BP: 137/80 mmHg (12/17 0500) Pulse Rate: 70  (12/17 0500) Intake/Output from previous day: 12/16 0701 - 12/17 0700 In: 3266.7 [I.V.:2466.7; IV Piggyback:800] Out: 3201 [Urine:2725; Emesis/NG output:450; Drains:26] Intake/Output from this shift:    Labs:  [facility] 09/22/11 0432 09/20/11 0413  WBC 10.7* 10.5  HGB 14.3 12.7*  HCT 40.0 36.8*  PLT 290 277  APTT -- --  INR -- --     Basename 09/22/11 0432 09/21/11 0450 09/20/11 0413  NA 137 137 135  K 3.8 3.4* 3.1*  CL 104 103 98  CO2 24 27 29   GLUCOSE 113* 103* 125*  BUN 9 12 16   CREATININE 0.82 0.82 0.87  LABCREA -- -- --  CREAT24HRUR -- -- --  CALCIUM 9.3 9.0 8.8  MG -- -- --  PHOS -- -- --  PROT 6.1 -- --  ALBUMIN 3.1* -- --  AST 18 -- --  ALT 21 -- --  ALKPHOS 72 -- --  BILITOT 0.7 -- --  BILIDIR -- -- --  IBILI -- -- --  PREALBUMIN -- -- --  TRIG -- -- --  CHOLHDL -- -- --  CHOL -- -- --   Estimated Creatinine Clearance: 103.5 ml/min (by C-G formula based on Cr of 0.82).   No results found for this basename: GLUCAP:3 in the last 72 hours  Medical History: Past Medical History  Diagnosis Date  . Hypertension   . Hernia   . Asthma 09-08-11    controlled with inhalers  . Colon cancer 09-08-11    colon ca dx 02/2010. Chemo x 12 sessions  . COPD (chronic obstructive pulmonary disease) 09-08-11    previous smoker  . 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 Incisional hernia s/p open VWH repair w mesh [id number] 09/13/2011  . Complication  of anesthesia     ? pneumonia after surgery in 2011  . Hiatal hernia   . Acid reflux     Medications:  Scheduled:    . budesonide-formoterol  2 puff Inhalation BID  . carvedilol  6.25 mg Oral BID WC  . citalopram  40 mg Oral Daily  . Flora-Q  1 capsule Oral Daily  . heparin  5,000 Units Subcutaneous Q8H  . lip balm  1 application Topical BID  . pantoprazole (PROTONIX) IV  40 mg Intravenous Q12H  . potassium chloride  10 mEq Intravenous Q1 Hr x 2  . tiotropium  18 mcg Inhalation Daily  . DISCONTD: ciprofloxacin  400 mg Intravenous Q12H  . DISCONTD: metronidazole  500 mg Intravenous Q6H   Infusions:    . ADULT TPN    . dextrose 5 % and 0.9 % NaCl with KCl 40 mEq/L Stopped (09/22/11 0516)  . DISCONTD: dextrose 5 % and 0.9 % NaCl with KCl 20 mEq/L Stopped (09/21/11 0408)   PRN: albuterol, alum & mag hydroxide-simeth, guaiFENesin-dextromethorphan, HYDROmorphone (DILAUDID) injection, magic mouthwash, menthol-cetylpyridinium, metoprolol, ondansetron, promethazine, zolpidem, DISCONTD: bisacodyl, DISCONTD: HYDROmorphone  Insulin Requirements in the past 24 hours:  None  Current Nutrition:  NPO  Assessment:  65yo male s/p ventral hernia repair on 09/10/11.  Readmitted 09/18/11 with N/V.  Still unable to tolerate enteral diet.  Chemistries and glucose wnl.  Nutritional Goals:  Dietary consult pending. 2000-2400 kCal, 110-145 grams of protein per day.  Plan:   Start Clinimix E 5/15 at 33ml/hr and lipid emulsion at 77ml/hr.  Advancing over the next few days to a goal of 38ml/hr and 93ml/hr lipid emulsion.  Reduce IVF by 38ml/hr.  Add CBGs/SSI q6h.  TNA labs tomorrow and every Monday and Thursday.  F/u RD's recommendations.  Lipids, MVI, trace elements will be given on MWF only due to supply shortage.  Reece Packer 09/22/2011,7:13 AM

## 2011-09-22 NOTE — Progress Notes (Signed)
INITIAL ADULT NUTRITION ASSESSMENT Date: 09/22/2011   Time: 1:29 PM Reason for Assessment: TNA  ASSESSMENT: Male 65 y.o.  Dx: SBO (small bowel obstruction)  Hx:  Past Medical History  Diagnosis Date  . Hypertension   . Hernia   . Asthma 09-08-11    controlled with inhalers  . Colon cancer 09-08-11    colon ca dx 02/2010. Chemo x 12 sessions  . COPD (chronic obstructive pulmonary disease) 09-08-11    previous smoker  . 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 Incisional hernia s/p open VWH repair w mesh [id number] 09/13/2011  . Complication of anesthesia     ? pneumonia after surgery in 2011  . Hiatal hernia   . Acid reflux    Related Meds:  Scheduled Meds:   . budesonide-formoterol  2 puff Inhalation BID  . carvedilol  6.25 mg Oral BID WC  . citalopram  40 mg Oral Daily  . Flora-Q  1 capsule Oral Daily  . heparin  5,000 Units Subcutaneous Q8H  . insulin aspart  0-15 Units Subcutaneous Q6H  . lip balm  1 application Topical BID  . pantoprazole (PROTONIX) IV  40 mg Intravenous Q12H  . tiotropium  18 mcg Inhalation Daily  . DISCONTD: ciprofloxacin  400 mg Intravenous Q12H  . DISCONTD: metronidazole  500 mg Intravenous Q6H   Continuous Infusions:   . dextrose 5 % and 0.9 % NaCl with KCl 40 mEq/L Stopped (09/22/11 0516)  . dextrose 5 % and 0.9 % NaCl with KCl 40 mEq/L    . TPN (CLINIMIX) +/- additives     And  . fat emulsion    . DISCONTD: ADULT TPN     PRN Meds:.albuterol, alum & mag hydroxide-simeth, guaiFENesin-dextromethorphan, HYDROmorphone (DILAUDID) injection, magic mouthwash, menthol-cetylpyridinium, metoprolol, ondansetron, promethazine, zolpidem, DISCONTD: bisacodyl, DISCONTD: HYDROmorphone  Ht: 5\' 10"  (177.8 cm)  Wt: 214 lb (97.07 kg)  Ideal Wt: 75.5kg % Ideal Wt: 128  Usual Wt: Stable per pt report  Body mass index is 30.71 kg/(m^2).  Food/Nutrition Related Hx: Per conversation with pt and family, pt has not  had a solid meal since before hernia repair surgery on 09/10/11. Pt reports ever since then he has had nausea/vomiting. Pt reports slight nausea currently, no vomiting today. Pt reports having 4 bowel movements today after enema. Pt is s/p chemotherapy and partial colectomy for rectal cancer, T3, N1, M0. Pt with NG tube in place. Pt found to have early post op partial SBO per MD notes. Pt scheduled to have PICC placed today to start TNA.   DG of abdomen 12/17 showed persistent dilated loop of small bowel in the left upper quadrant, with gas seen in nondistended small bowel and colon elsewhere in the abdomen. Partial small bowel obstruction could have this appearance.  Labs:  CMP     Component Value Date/Time   NA 137 09/22/2011 0432   K 3.8 09/22/2011 0432   CL 104 09/22/2011 0432   CO2 24 09/22/2011 0432   GLUCOSE 113* 09/22/2011 0432   BUN 9 09/22/2011 0432   CREATININE 0.82 09/22/2011 0432   CALCIUM 9.3 09/22/2011 0432   PROT 6.1 09/22/2011 0432   ALBUMIN 3.1* 09/22/2011 0432   AST 18 09/22/2011 0432   ALT 21 09/22/2011 0432   ALKPHOS 72 09/22/2011 0432   BILITOT 0.7 09/22/2011 0432   GFRNONAA >90 09/22/2011 0432   GFRAA >90 09/22/2011 0432   PALB 15.8  Intake/Output Summary (Last 24 hours) at 09/22/11 1348 Last data filed at 09/22/11 0900  Gross per 24 hour  Intake 3266.67 ml  Output   2780 ml  Net 486.67 ml   NG output: x 13 hours Last BM - 12/17 2ml output from each abdomen bulb drain  Diet Order: NPO  IVF:    dextrose 5 % and 0.9 % NaCl with KCl 40 mEq/L Last Rate: Stopped (09/22/11 0516)  dextrose 5 % and 0.9 % NaCl with KCl 40 mEq/L   TPN (CLINIMIX) +/- additives   And   fat emulsion   DISCONTD: ADULT TPN     Estimated Nutritional Needs:   Kcal:1875-2250 Protein:115-135g Fluid:1.8-2.2L  NUTRITION DIAGNOSIS: -Inadequate oral intake (NI-2.1).  Status: Ongoing  RELATED TO: early partial SBO  AS EVIDENCE BY: MD notes, NPO, NG  tube  MONITORING/EVALUATION(Goals): TNA to meet >90% estimated nutritional needs.   EDUCATION NEEDS: -No education needs identified at this time. Pt reports understanding of TNA as he reports he has been on it in the past.   INTERVENTION: TNA per pharmacy. Recommend monitoring for refeeding syndrome as pt without solid meal since the beginning of December. Diet per MD. Will monitor.   Dietitian # [phone]  DOCUMENTATION CODES Per approved criteria  -Obesity Unspecified    Marshall Cork 09/22/2011, 1:29 PM

## 2011-09-23 LAB — CBC
HCT: 37.8 % — ABNORMAL LOW (ref 39.0–52.0)
Hemoglobin: 13.3 g/dL (ref 13.0–17.0)
MCH: 31.9 pg (ref 26.0–34.0)
MCHC: 35.2 g/dL (ref 30.0–36.0)
MCV: 90.6 fL (ref 78.0–100.0)
RBC: 4.17 MIL/uL — ABNORMAL LOW (ref 4.22–5.81)

## 2011-09-23 LAB — DIFFERENTIAL
Basophils Relative: 0 % (ref 0–1)
Eosinophils Absolute: 0.2 10*3/uL (ref 0.0–0.7)
Eosinophils Relative: 2 % (ref 0–5)
Lymphs Abs: 1.2 10*3/uL (ref 0.7–4.0)
Monocytes Absolute: 1.1 10*3/uL — ABNORMAL HIGH (ref 0.1–1.0)
Monocytes Relative: 10 % (ref 3–12)

## 2011-09-23 LAB — COMPREHENSIVE METABOLIC PANEL
ALT: 18 U/L (ref 0–53)
Alkaline Phosphatase: 66 U/L (ref 39–117)
BUN: 10 mg/dL (ref 6–23)
CO2: 25 mEq/L (ref 19–32)
GFR calc Af Amer: >90 mL/min (ref >90)
GFR calc non Af Amer: 90 mL/min — ABNORMAL LOW (ref >90)
Glucose, Bld: 122 mg/dL — ABNORMAL HIGH (ref 70–99)
Potassium: 3.6 mEq/L (ref 3.5–5.1)
Sodium: 136 mEq/L (ref 135–145)
Total Protein: 5.7 g/dL — ABNORMAL LOW (ref 6.0–8.3)

## 2011-09-23 LAB — GLUCOSE, CAPILLARY
Glucose-Capillary: 126 mg/dL — ABNORMAL HIGH (ref 70–99)
Glucose-Capillary: 147 mg/dL — ABNORMAL HIGH (ref 70–99)

## 2011-09-23 LAB — CHOLESTEROL, TOTAL: Cholesterol: 136 mg/dL (ref 0–200)

## 2011-09-23 LAB — PREALBUMIN: Prealbumin: 14 mg/dL — ABNORMAL LOW (ref 17.0–34.0)

## 2011-09-23 MED ORDER — CLINIMIX E/DEXTROSE (5/15) 5 % IV SOLN
INTRAVENOUS | Status: AC
  Administered 2011-09-23: 18:00:00 via INTRAVENOUS
  Filled 2011-09-23: qty 2000

## 2011-09-23 NOTE — Progress Notes (Signed)
Subjective: The patient had a very large bowel movement following soapsuds enema yesterday. He feels better He is passing lots of flatus. Denies abdominal pain. Is very hungry. NG drainage is low to moderate but is now nonbilious.  I discussed all of the patient's x-rays with Dr. Carmelina Noun in radiology. She thinks the initial CT scan shows a low-grade, rather than a high-grade bowel obstruction. Plain films still show a loop of small bowel in the left upper quadrant but there is scattered gas throughout the small bowel and colon.  Clinically he seems to be improving, and I am not certain of the significance of this single loop of small bowel.  Today's lab work looks fine  Objective: Vital signs in last 24 hours: Temp:  [98.4 F (36.9 C)-98.9 F (37.2 C)] 98.9 F (37.2 C) (12/18 0555) Pulse Rate:  [66-72] 66  (12/18 0555) Resp:  [18] 18  (12/18 0555) BP: (127-152)/(80-86) 152/86 mmHg (12/18 0555) SpO2:  [91 %-97 %] 94 % (12/18 0555) Last BM Date: 09/22/11  Intake/Output from previous day: 12/17 0701 - 12/18 0700 In: 1200 [I.V.:600; TPN:600] Out: 1899 [Urine:1775; Emesis/NG output:120; Drains:4] Intake/Output this shift: Total I/O In: 1200 [I.V.:600; TPN:600] Out: 1175 [Urine:1175]  General appearance: patient is alert and in no distress. Resp: clear to auscultation bilaterally GI: abdomen is soft and nontender. Bowel sounds are present. He really doesn't seem tympanitic at all. Question whether he may be slightly distended. Midline wound looks good. Drains are out.  Lab Results:  Results for orders placed during the hospital encounter of 09/18/11 (from the past 24 hour(s))  GLUCOSE, CAPILLARY     Status: Abnormal   Collection Time   09/22/11  6:41 PM      Component Value Range   Glucose-Capillary 126 (*) 70 - 99 (mg/dL)  GLUCOSE, CAPILLARY     Status: Abnormal   Collection Time   09/22/11 11:57 PM      Component Value Range   Glucose-Capillary 147 (*) 70 - 99 (mg/dL)    COMPREHENSIVE METABOLIC PANEL     Status: Abnormal   Collection Time   09/23/11  4:30 AM      Component Value Range   Sodium 136  135 - 145 (mEq/L)   Potassium 3.6  3.5 - 5.1 (mEq/L)   Chloride 104  96 - 112 (mEq/L)   CO2 25  19 - 32 (mEq/L)   Glucose, Bld 122 (*) 70 - 99 (mg/dL)   BUN 10  6 - 23 (mg/dL)   Creatinine, Ser 1.61  0.50 - 1.35 (mg/dL)   Calcium 9.0  8.4 - 09.6 (mg/dL)   Total Protein 5.7 (*) 6.0 - 8.3 (g/dL)   Albumin 2.9 (*) 3.5 - 5.2 (g/dL)   AST 14  0 - 37 (U/L)   ALT 18  0 - 53 (U/L)   Alkaline Phosphatase 66  39 - 117 (U/L)   Total Bilirubin 0.4  0.3 - 1.2 (mg/dL)   GFR calc non Af Amer 90 (*) >90 (mL/min)   GFR calc Af Amer >90  >90 (mL/min)  MAGNESIUM     Status: Normal   Collection Time   09/23/11  4:30 AM      Component Value Range   Magnesium 2.0  1.5 - 2.5 (mg/dL)  PHOSPHORUS     Status: Normal   Collection Time   09/23/11  4:30 AM      Component Value Range   Phosphorus 3.1  2.3 - 4.6 (mg/dL)  CBC  Status: Abnormal   Collection Time   09/23/11  4:30 AM      Component Value Range   WBC 10.6 (*) 4.0 - 10.5 (K/uL)   RBC 4.17 (*) 4.22 - 5.81 (MIL/uL)   Hemoglobin 13.3  13.0 - 17.0 (g/dL)   HCT 09.6 (*) 04.5 - 52.0 (%)   MCV 90.6  78.0 - 100.0 (fL)   MCH 31.9  26.0 - 34.0 (pg)   MCHC 35.2  30.0 - 36.0 (g/dL)   RDW 40.9  81.1 - 91.4 (%)   Platelets 288  150 - 400 (K/uL)  DIFFERENTIAL     Status: Abnormal   Collection Time   09/23/11  4:30 AM      Component Value Range   Neutrophils Relative 76  43 - 77 (%)   Neutro Abs 8.0 (*) 1.7 - 7.7 (K/uL)   Lymphocytes Relative 12  12 - 46 (%)   Lymphs Abs 1.2  0.7 - 4.0 (K/uL)   Monocytes Relative 10  3 - 12 (%)   Monocytes Absolute 1.1 (*) 0.1 - 1.0 (K/uL)   Eosinophils Relative 2  0 - 5 (%)   Eosinophils Absolute 0.2  0.0 - 0.7 (K/uL)   Basophils Relative 0  0 - 1 (%)   Basophils Absolute 0.0  0.0 - 0.1 (K/uL)  GLUCOSE, CAPILLARY     Status: Abnormal   Collection Time   09/23/11  5:56 AM       Component Value Range   Glucose-Capillary 141 (*) 70 - 99 (mg/dL)     Studies/Results: @RISRSLT24 @     . budesonide-formoterol  2 puff Inhalation BID  . carvedilol  6.25 mg Oral BID WC  . citalopram  40 mg Oral Daily  . Flora-Q  1 capsule Oral Daily  . heparin  5,000 Units Subcutaneous Q8H  . insulin aspart  0-15 Units Subcutaneous Q6H  . lip balm  1 application Topical BID  . pantoprazole (PROTONIX) IV  40 mg Intravenous Q12H  . tiotropium  18 mcg Inhalation Daily  . DISCONTD: ciprofloxacin  400 mg Intravenous Q12H  . DISCONTD: metronidazole  500 mg Intravenous Q6H     Assessment/Plan: s/p Open ventral hernia repair and extensive LOA POD #13   Postop partial small bowel obstruction versus ileus. Clinically he is  resolving. One loop of small bowel seen on x-rays of uncertain significance. We'll clamp his NG tube and allow clear liquids today. Advance slowly.  Protein calorie malnutrition, started on TNA last night.  We'll allow soapsuds enema PRN.  Asthma. Stable  Hypertension. Stable  Atrial fibrillation with rapid ventricular response, resolved, continue Lopressor IV. Convert to oral medications once GI function normalizes, but not yet  DVT prophylaxis. On subcutaneous heparin    LOS: 5 days    Ignatz Deis M 09/23/2011  . .prob

## 2011-09-23 NOTE — Progress Notes (Signed)
PARENTERAL NUTRITION CONSULT NOTE - FOLLOW UP  Pharmacy Consult for TNA Indication: SBO s/p hernia repair  Allergies  Allergen Reactions  . Penicillins Anaphylaxis and Other (See Comments)    Seizures    Patient Measurements: Height: 5\' 10"  (177.8 cm) Weight: 214 lb (97.07 kg) IBW/kg (Calculated) : 73  Adjusted Body Weight: 80kg Usual Weight:   Vital Signs: Temp: 98.9 F (37.2 C) (12/18 0555) Temp src: Oral (12/18 0555) BP: 152/86 mmHg (12/18 0555) Pulse Rate: 66  (12/18 0555) Intake/Output from previous day: 12/17 0701 - 12/18 0700 In: 1200 [I.V.:600; TPN:600] Out: 1899 [Urine:1775; Emesis/NG output:120; Drains:4] Intake/Output from this shift:    Labs:  [facility] 09/23/11 0430 09/22/11 0432  WBC 10.6* 10.7*  HGB 13.3 14.3  HCT 37.8* 40.0  PLT 288 290  APTT -- --  INR -- --     Basename 09/23/11 0430 09/22/11 0432 09/21/11 0450  NA 136 137 137  K 3.6 3.8 3.4*  CL 104 104 103  CO2 25 24 27   GLUCOSE 122* 113* 103*  BUN 10 9 12   CREATININE 0.83 0.82 0.82  LABCREA -- -- --  CREAT24HRUR -- -- --  CALCIUM 9.0 9.3 9.0  MG 2.0 -- --  PHOS 3.1 -- --  PROT 5.7* 6.1 --  ALBUMIN 2.9* 3.1* --  AST 14 18 --  ALT 18 21 --  ALKPHOS 66 72 --  BILITOT 0.4 0.7 --  BILIDIR -- -- --  IBILI -- -- --  PREALBUMIN -- 15.8* --  TRIG -- -- --  CHOLHDL -- -- --  CHOL -- -- --   Estimated Creatinine Clearance: 102.3 ml/min (by C-G formula based on Cr of 0.83).    Basename 09/23/11 0556 09/22/11 2357 09/22/11 1841  GLUCAP 141* 147* 126*    Medications:  Scheduled:    . budesonide-formoterol  2 puff Inhalation BID  . carvedilol  6.25 mg Oral BID WC  . citalopram  40 mg Oral Daily  . Flora-Q  1 capsule Oral Daily  . heparin  5,000 Units Subcutaneous Q8H  . insulin aspart  0-15 Units Subcutaneous Q6H  . lip balm  1 application Topical BID  . pantoprazole (PROTONIX) IV  40 mg Intravenous Q12H  . tiotropium  18 mcg Inhalation Daily    Insulin Requirements in the  past 24 hours:  4 units Novolog SSI  Current Nutrition: TNA 40 ml/hr 20% lipids 10 ml/hr D5NS 40KCl 25ml/hr NPO --> Clear Liquid Diet  Assessment:  65 yo male s/p ventral hernia repair on 09/10/11  Readmitted 09/18/11 with N/V  Unable to tolerate enteral diet  Having BM, flatus and reports that he's hungry  Glucose & electrolytes wnl  Nutritional Goals:  1875-2250 kCal, 115-135 grams of protein per day  Plan:   Advance rate to 2ml/hr  Lipids MWF only due to national backorder  MVI and trace elements MWF only due to national backorder  Continue CBGs/SSI q6h  Will not likely need to advance TNA to goal rate of 3ml/hr since already advancing diet and bowel function is returning  Rollene Fare 09/23/2011,7:48 AM Pager: [phone]

## 2011-09-24 LAB — GLUCOSE, CAPILLARY
Glucose-Capillary: 117 mg/dL — ABNORMAL HIGH (ref 70–99)
Glucose-Capillary: 135 mg/dL — ABNORMAL HIGH (ref 70–99)

## 2011-09-24 MED ORDER — TRACE MINERALS CR-CU-MN-SE-ZN 10-1000-500-60 MCG/ML IV SOLN
INTRAVENOUS | Status: DC
  Administered 2011-09-24: 17:00:00 via INTRAVENOUS
  Filled 2011-09-24: qty 1000

## 2011-09-24 MED ORDER — FAT EMULSION 20 % IV EMUL
250.0000 mL | INTRAVENOUS | Status: DC
  Administered 2011-09-24: 250 mL via INTRAVENOUS
  Filled 2011-09-24: qty 250

## 2011-09-24 MED ORDER — INSULIN ASPART 100 UNIT/ML ~~LOC~~ SOLN
0.0000 [IU] | Freq: Two times a day (BID) | SUBCUTANEOUS | Status: DC
  Administered 2011-09-24 – 2011-09-26 (×3): 2 [IU] via SUBCUTANEOUS
  Filled 2011-09-24: qty 3

## 2011-09-24 NOTE — Plan of Care (Signed)
Problem: Inadequate Intake (NI-2.1) Goal: Food and/or nutrient delivery Individualized approach for food/nutrient provision.  Outcome: Progressing Diet advanced to full liquids, pt tolerating well, eating 100%

## 2011-09-24 NOTE — Progress Notes (Signed)
Subjective: POD#14  Denies pain or nausea. Tolerating clear liquids with NG clamp. Had 2 spontaneous bowel movements. No new complaints.  Objective: Vital signs in last 24 hours: Temp:  [97.4 F (36.3 C)-99.3 F (37.4 C)] 97.4 F (36.3 C) (12/18 2215) Pulse Rate:  [66-90] 90  (12/18 2215) Resp:  [18] 18  (12/18 2215) BP: (141-152)/(79-86) 141/79 mmHg (12/18 2215) SpO2:  [93 %-96 %] 96 % (12/18 2215) Last BM Date: 09/23/11  Intake/Output from previous day: 12/18 0701 - 12/19 0700 In: 1537.7 [I.V.:1127.3; TPN:410.4] Out: 1977 [Urine:1975; Stool:2] Intake/Output this shift: Total I/O In: 700 [I.V.:700] Out: 400 [Urine:400]  GI: abdomen is soft and nontender. Not obviously distended. Hypoactive bowel sounds. Wound is clean.  Lab Results:  Results for orders placed during the hospital encounter of 09/18/11 (from the past 24 hour(s))  COMPREHENSIVE METABOLIC PANEL     Status: Abnormal   Collection Time   09/23/11  4:30 AM      Component Value Range   Sodium 136  135 - 145 (mEq/L)   Potassium 3.6  3.5 - 5.1 (mEq/L)   Chloride 104  96 - 112 (mEq/L)   CO2 25  19 - 32 (mEq/L)   Glucose, Bld 122 (*) 70 - 99 (mg/dL)   BUN 10  6 - 23 (mg/dL)   Creatinine, Ser 8.41  0.50 - 1.35 (mg/dL)   Calcium 9.0  8.4 - 32.4 (mg/dL)   Total Protein 5.7 (*) 6.0 - 8.3 (g/dL)   Albumin 2.9 (*) 3.5 - 5.2 (g/dL)   AST 14  0 - 37 (U/L)   ALT 18  0 - 53 (U/L)   Alkaline Phosphatase 66  39 - 117 (U/L)   Total Bilirubin 0.4  0.3 - 1.2 (mg/dL)   GFR calc non Af Amer 90 (*) >90 (mL/min)   GFR calc Af Amer >90  >90 (mL/min)  PREALBUMIN     Status: Abnormal   Collection Time   09/23/11  4:30 AM      Component Value Range   Prealbumin 14.0 (*) 17.0 - 34.0 (mg/dL)  MAGNESIUM     Status: Normal   Collection Time   09/23/11  4:30 AM      Component Value Range   Magnesium 2.0  1.5 - 2.5 (mg/dL)  PHOSPHORUS     Status: Normal   Collection Time   09/23/11  4:30 AM      Component Value Range   Phosphorus 3.1  2.3 - 4.6 (mg/dL)  CHOLESTEROL, TOTAL     Status: Normal   Collection Time   09/23/11  4:30 AM      Component Value Range   Cholesterol 136  0 - 200 (mg/dL)  TRIGLYCERIDES     Status: Normal   Collection Time   09/23/11  4:30 AM      Component Value Range   Triglycerides 105  <150 (mg/dL)  CBC     Status: Abnormal   Collection Time   09/23/11  4:30 AM      Component Value Range   WBC 10.6 (*) 4.0 - 10.5 (K/uL)   RBC 4.17 (*) 4.22 - 5.81 (MIL/uL)   Hemoglobin 13.3  13.0 - 17.0 (g/dL)   HCT 40.1 (*) 02.7 - 52.0 (%)   MCV 90.6  78.0 - 100.0 (fL)   MCH 31.9  26.0 - 34.0 (pg)   MCHC 35.2  30.0 - 36.0 (g/dL)   RDW 25.3  66.4 - 40.3 (%)   Platelets 288  150 -  400 (K/uL)  DIFFERENTIAL     Status: Abnormal   Collection Time   09/23/11  4:30 AM      Component Value Range   Neutrophils Relative 76  43 - 77 (%)   Neutro Abs 8.0 (*) 1.7 - 7.7 (K/uL)   Lymphocytes Relative 12  12 - 46 (%)   Lymphs Abs 1.2  0.7 - 4.0 (K/uL)   Monocytes Relative 10  3 - 12 (%)   Monocytes Absolute 1.1 (*) 0.1 - 1.0 (K/uL)   Eosinophils Relative 2  0 - 5 (%)   Eosinophils Absolute 0.2  0.0 - 0.7 (K/uL)   Basophils Relative 0  0 - 1 (%)   Basophils Absolute 0.0  0.0 - 0.1 (K/uL)  GLUCOSE, CAPILLARY     Status: Abnormal   Collection Time   09/23/11  5:56 AM      Component Value Range   Glucose-Capillary 141 (*) 70 - 99 (mg/dL)  GLUCOSE, CAPILLARY     Status: Abnormal   Collection Time   09/23/11 12:01 PM      Component Value Range   Glucose-Capillary 125 (*) 70 - 99 (mg/dL)  GLUCOSE, CAPILLARY     Status: Abnormal   Collection Time   09/23/11  6:31 PM      Component Value Range   Glucose-Capillary 126 (*) 70 - 99 (mg/dL)  GLUCOSE, CAPILLARY     Status: Abnormal   Collection Time   09/24/11 12:09 AM      Component Value Range   Glucose-Capillary 117 (*) 70 - 99 (mg/dL)     Studies/Results: @RISRSLT24 @     . budesonide-formoterol  2 puff Inhalation BID  . carvedilol  6.25  mg Oral BID WC  . citalopram  40 mg Oral Daily  . Flora-Q  1 capsule Oral Daily  . heparin  5,000 Units Subcutaneous Q8H  . insulin aspart  0-15 Units Subcutaneous Q6H  . lip balm  1 application Topical BID  . pantoprazole (PROTONIX) IV  40 mg Intravenous Q12H  . tiotropium  18 mcg Inhalation Daily     Assessment/Plan: s/p Open ventral hernia repair with mesh and extensive LOA  Partial small bowel structure versus ileus. This is resolving. Will discontinue NG tube this morning. Allow full liquid diet.  Continue TNA for another day.  If he does well, restart all P.O.   meds tomorrow.  Asthma. Stable.  Hypertension. Stable.  Atrial fibrillation with rapid ventricular response, resolved, continue Lopressor IV. Convert to oral medications tomorrow if he does well.  DVT prophylaxis. On subcutaneous heparin.    LOS: 6 days    Roxy Filler M 09/24/2011  . .prob

## 2011-09-24 NOTE — Progress Notes (Signed)
Met with patient. Wife later arrived. Patient is member of [employer] on [place]. He is a Psychologist, counselling. Pleasant visit. His pastor is providing support also.  09/24/11 1500  Clinical Encounter Type  Visited With Patient and family together  Visit Type Spiritual support;Social support  Referral From Other (Comment) (Friend of family)  Spiritual Encounters  Spiritual Needs Emotional  Stress Factors  Patient Stress Factors Other (Comment) [facility] stay)

## 2011-09-24 NOTE — Progress Notes (Signed)
PARENTERAL NUTRITION CONSULT NOTE - FOLLOW UP  Pharmacy Consult for TNA Indication: SBO s/p hernia repair  Allergies  Allergen Reactions  . Penicillins Anaphylaxis and Other (See Comments)    Seizures    Patient Measurements: Height: 5\' 10"  (177.8 cm) Weight: 214 lb (97.07 kg) IBW/kg (Calculated) : 73  Adjusted Body Weight: 80kg Usual Weight:   Vital Signs: Temp: 98 F (36.7 C) (12/19 0600) Temp src: Oral (12/19 0600) BP: 152/85 mmHg (12/19 0600) Pulse Rate: 69  (12/19 0600) Intake/Output from previous day: 12/18 0701 - 12/19 0700 In: 2437.7 [I.V.:1427.3; TPN:1010.4] Out: 1977 [Urine:1975; Stool:2] Intake/Output from this shift:    Labs:  [facility] 09/23/11 0430 09/22/11 0432  WBC 10.6* 10.7*  HGB 13.3 14.3  HCT 37.8* 40.0  PLT 288 290  APTT -- --  INR -- --     Basename 09/23/11 0430 09/22/11 0432  NA 136 137  K 3.6 3.8  CL 104 104  CO2 25 24  GLUCOSE 122* 113*  BUN 10 9  CREATININE 0.83 0.82  LABCREA -- --  CREAT24HRUR -- --  CALCIUM 9.0 9.3  MG 2.0 --  PHOS 3.1 --  PROT 5.7* 6.1  ALBUMIN 2.9* 3.1*  AST 14 18  ALT 18 21  ALKPHOS 66 72  BILITOT 0.4 0.7  BILIDIR -- --  IBILI -- --  PREALBUMIN 14.0* 15.8*  TRIG 105 --  CHOLHDL -- --  CHOL 136 --   Estimated Creatinine Clearance: 102.3 ml/min (by C-G formula based on Cr of 0.83).    Basename 09/24/11 0552 09/24/11 0009 09/23/11 1831  GLUCAP 135* 117* 126*    Medications:  Scheduled:     . budesonide-formoterol  2 puff Inhalation BID  . carvedilol  6.25 mg Oral BID WC  . citalopram  40 mg Oral Daily  . Flora-Q  1 capsule Oral Daily  . heparin  5,000 Units Subcutaneous Q8H  . insulin aspart  0-15 Units Subcutaneous Q6H  . lip balm  1 application Topical BID  . pantoprazole (PROTONIX) IV  40 mg Intravenous Q12H  . tiotropium  18 mcg Inhalation Daily    Insulin Requirements in the past 24 hours:  4 units Novolog SSI  Current Nutrition: TNA 50 ml/hr 20% lipids 10 ml/hr  (MWF) D5NS 40KCl 69ml/hr Full liquid diet  Assessment:  65 yo male s/p ventral hernia repair and LOA on 09/10/11  Readmitted 09/18/11 with N/V 2/2 partial small bowel structure versus ileus; per surgery, this is resolving.  Having BMs, tolerating clear liquids with NG clamp. Plan is to d/c NG tube, advance to full liquid diet today, and continue TNA for 1 more day.   Glucose & electrolytes wnl  Nutritional Goals:  1875-2250 kCal, 115-135 grams of protein per day  Plan:   Reduce TNA rate to 31ml/hr for last bag of TNA. May discontinue TNA without further taper tomorrow when surgery deems appropriate.  Lipids MWF only due to national backorder  MVI and trace elements MWF only due to national backorder  Considering glucose under control, will adjust CBGs/SSI to q12h  [name] 09/24/2011,7:14 AM Pager: [phone]

## 2011-09-24 NOTE — Progress Notes (Signed)
Nutrition Follow-up  Diet Order: Full liquid, intake 100% of meals  TNA: Clinimix 5/15 at 57ml/hr, 20% lipids at 61ml/hr (MWF) which provides an average of 1058 calories, 60g protein which meets 56% calorie needs and 52% protein needs.   Additional IVF with D5 NS @ 50 ml/hr provides 204 calories  - Pt reports feeling better after bowel movements yesterday. NG tube d/c this morning. Denies nausea, reports being hungry. Provided pt with chocolate Ensure, which pt did not like. Assisted pt with ordering lunch menu. Noted MD reports pt improving clinically, diet advanced to full liquid since yesterday, and plans to only continue TNA for another day.   Meds: Scheduled Meds:   . budesonide-formoterol  2 puff Inhalation BID  . carvedilol  6.25 mg Oral BID WC  . citalopram  40 mg Oral Daily  . Flora-Q  1 capsule Oral Daily  . heparin  5,000 Units Subcutaneous Q8H  . insulin aspart  0-15 Units Subcutaneous Q12H  . lip balm  1 application Topical BID  . pantoprazole (PROTONIX) IV  40 mg Intravenous Q12H  . tiotropium  18 mcg Inhalation Daily  . DISCONTD: insulin aspart  0-15 Units Subcutaneous Q6H   Continuous Infusions:   . dextrose 5 % and 0.9 % NaCl with KCl 40 mEq/L 50 mL/hr at 09/23/11 2006  . TPN (CLINIMIX) +/- additives 40 mL/hr at 09/22/11 1812   And  . fat emulsion 240 mL (09/22/11 1812)  . fat emulsion    . TPN (CLINIMIX) +/- additives    . TPN (CLINIMIX) +/- additives 50 mL/hr at 09/23/11 1809   PRN Meds:.albuterol, alum & mag hydroxide-simeth, guaiFENesin-dextromethorphan, HYDROmorphone (DILAUDID) injection, magic mouthwash w/lidocaine, menthol-cetylpyridinium, metoprolol, ondansetron, promethazine, zolpidem, DISCONTD: phenol  Labs:  CMP     Component Value Date/Time   NA 136 09/23/2011 0430   K 3.6 09/23/2011 0430   CL 104 09/23/2011 0430   CO2 25 09/23/2011 0430   GLUCOSE 122* 09/23/2011 0430   BUN 10 09/23/2011 0430   CREATININE 0.83 09/23/2011 0430   CALCIUM 9.0  09/23/2011 0430   PROT 5.7* 09/23/2011 0430   ALBUMIN 2.9* 09/23/2011 0430   AST 14 09/23/2011 0430   ALT 18 09/23/2011 0430   ALKPHOS 66 09/23/2011 0430   BILITOT 0.4 09/23/2011 0430   GFRNONAA 90* 09/23/2011 0430   GFRAA >90 09/23/2011 0430   12/18 PALB 14, slightly down from 15.8 on 12/17   Intake/Output Summary (Last 24 hours) at 09/24/11 1235 Last data filed at 09/24/11 0900  Gross per 24 hour  Intake 2557.69 ml  Output   1716 ml  Net 841.69 ml   Last BM - 12/18  Weight Status:  No new weights  Nutrition Dx:  Inadequate oral intake - improving  Goal:  TNA to meet >90% of estimated nutritional needs - not met, diet advanced  New goal: Advance diet as tolerated to low fiber diet.   Intervention: TNA per pharmacy. Diet advancement per MD. Encouraged increased intake. Will monitor.   Marshall Cork Pager #: [phone]

## 2011-09-25 ENCOUNTER — Other Ambulatory Visit: Payer: Medicare Other | Admitting: Lab

## 2011-09-25 ENCOUNTER — Ambulatory Visit: Payer: Medicare Other | Admitting: Nurse Practitioner

## 2011-09-25 LAB — COMPREHENSIVE METABOLIC PANEL
AST: 15 U/L (ref 0–37)
Albumin: 3.2 g/dL — ABNORMAL LOW (ref 3.5–5.2)
Alkaline Phosphatase: 73 U/L (ref 39–117)
BUN: 11 mg/dL (ref 6–23)
Chloride: 103 mEq/L (ref 96–112)
Potassium: 3.8 mEq/L (ref 3.5–5.1)
Sodium: 135 mEq/L (ref 135–145)
Total Bilirubin: 0.4 mg/dL (ref 0.3–1.2)
Total Protein: 6.4 g/dL (ref 6.0–8.3)

## 2011-09-25 LAB — GLUCOSE, CAPILLARY
Glucose-Capillary: 128 mg/dL — ABNORMAL HIGH (ref 70–99)
Glucose-Capillary: 145 mg/dL — ABNORMAL HIGH (ref 70–99)

## 2011-09-25 LAB — MAGNESIUM: Magnesium: 2.2 mg/dL (ref 1.5–2.5)

## 2011-09-25 MED ORDER — SODIUM CHLORIDE 0.9 % IJ SOLN
10.0000 mL | INTRAMUSCULAR | Status: DC | PRN
  Administered 2011-09-25: 10 mL

## 2011-09-25 MED ORDER — TRACE MINERALS CR-CU-MN-SE-ZN 10-1000-500-60 MCG/ML IV SOLN: INTRAVENOUS | Status: AC

## 2011-09-25 MED ORDER — HYDROCHLOROTHIAZIDE 25 MG PO TABS
25.0000 mg | ORAL_TABLET | Freq: Every day | ORAL | Status: DC
  Administered 2011-09-25 – 2011-09-26 (×2): 25 mg via ORAL
  Filled 2011-09-25 (×2): qty 1

## 2011-09-25 MED ORDER — PANTOPRAZOLE SODIUM 40 MG PO TBEC
40.0000 mg | DELAYED_RELEASE_TABLET | Freq: Two times a day (BID) | ORAL | Status: DC
  Administered 2011-09-25 – 2011-09-26 (×2): 40 mg via ORAL
  Filled 2011-09-25 (×3): qty 1

## 2011-09-25 MED ORDER — ALBUTEROL SULFATE HFA 108 (90 BASE) MCG/ACT IN AERS
2.0000 | INHALATION_SPRAY | Freq: Four times a day (QID) | RESPIRATORY_TRACT | Status: DC | PRN
  Filled 2011-09-25: qty 6.7

## 2011-09-25 MED ORDER — SODIUM CHLORIDE 0.9 % IJ SOLN
10.0000 mL | Freq: Two times a day (BID) | INTRAMUSCULAR | Status: DC
  Administered 2011-09-25 (×2): 10 mL

## 2011-09-25 MED ORDER — FAT EMULSION 20 % IV EMUL: 250.0000 mL | INTRAVENOUS | Status: AC

## 2011-09-25 MED ORDER — LISINOPRIL 20 MG PO TABS
20.0000 mg | ORAL_TABLET | Freq: Every day | ORAL | Status: DC
  Administered 2011-09-25 – 2011-09-26 (×2): 20 mg via ORAL
  Filled 2011-09-25 (×2): qty 1

## 2011-09-25 MED ORDER — LISINOPRIL-HYDROCHLOROTHIAZIDE 20-25 MG PO TABS: 1.0000 | ORAL_TABLET | ORAL | Status: DC

## 2011-09-25 NOTE — Progress Notes (Signed)
The patient is receiving Protonix by the intravenous route. Based on criteria approved by the Pharmacy and Therapeutics Committee and the Medical Executive Committee, the medication is being converted to the equivalent oral dose form.   These criteria include:  -No Active GI bleeding  -Able to tolerate diet of full liquids (or better) or tube feeding  -Able to tolerate other medications by the oral or enteral route   If you have any questions about this conversion, please contact the Pharmacy Department (ext [phone]). Thank you.   Rollene Fare, 09/25/2011, 7:40 AM

## 2011-09-25 NOTE — Progress Notes (Signed)
  Subjective: POD #15  Patient is alert and feeling well. He has no complaints. He denies pain, nausea, CCA all of his full liquid diet. Had 2 more bowel movement spontaneously.  Objective: Vital signs in last 24 hours: Temp:  [98 F (36.7 C)-98.5 F (36.9 C)] 98.1 F (36.7 C) (12/20 0545) Pulse Rate:  [67-76] 67  (12/20 0545) Resp:  [18-20] 18  (12/20 0545) BP: (138-155)/(83-96) 138/83 mmHg (12/20 0545) SpO2:  [94 %-97 %] 95 % (12/20 0545) Last BM Date: 09/24/11  Intake/Output from previous day: 12/19 0701 - 12/20 0700 In: 1843.3 [P.O.:480; I.V.:963.3; TPN:400] Out: 2865 [Urine:2865] Intake/Output this shift: Total I/O In: 963.3 [I.V.:963.3] Out: 1325 [Urine:1325]  Resp: clear to auscultation bilaterally GI: abdomen is soft. Active bowel sounds. Midline wound healing without any signs of infection or drainage. Nontender.  Lab Results:  Results for orders placed during the hospital encounter of 09/18/11 (from the past 24 hour(s))  GLUCOSE, CAPILLARY     Status: Abnormal   Collection Time   09/24/11  7:23 PM      Component Value Range   Glucose-Capillary 131 (*) 70 - 99 (mg/dL)     Studies/Results: @RISRSLT24 @     . budesonide-formoterol  2 puff Inhalation BID  . carvedilol  6.25 mg Oral BID WC  . citalopram  40 mg Oral Daily  . Flora-Q  1 capsule Oral Daily  . heparin  5,000 Units Subcutaneous Q8H  . insulin aspart  0-15 Units Subcutaneous Q12H  . lip balm  1 application Topical BID  . lisinopril-hydrochlorothiazide  1 tablet Oral Q0700  . pantoprazole (PROTONIX) IV  40 mg Intravenous Q12H  . tiotropium  18 mcg Inhalation Daily  . DISCONTD: insulin aspart  0-15 Units Subcutaneous Q6H     Assessment/Plan: s/p open ventral hernia repair with mesh and extensive lysis of adhesions. Postop day 15. Uneventful wound healing.  Postop partial small bowel obstruction versus ileus. Resolving clinically. Will advance to modified carbohydrate diet  Discontinue  TNA.  Resume all p.o.meds  Hopefully he will continue to do well and be discharged home in 24-48 hours.  Asthma. Stable  Hypertension. Stable.  Atrial fibrillation with rapid ventricular response, resolved, continue Coreg. IV Lopressor when necessary. To restart all P.O. medications.   LOS: 7 days    Leodan Bolyard M 09/25/2011  . .prob

## 2011-09-26 NOTE — Progress Notes (Signed)
09/26/11 1140. Patient had PICC removed prior to discharge. Patient was given the discharge instructions. The patient went home with spouse. Pt. Education was given. Edvardo Honse, Bari Mantis

## 2011-09-26 NOTE — Discharge Summary (Addendum)
Patient ID: Christian Harrison [id number] 65 y.o. [birth year]  09/18/2011  Discharge date and time: Friday, 09-26-2011; 6:10 AM  Admitting Physician: [name]  Discharge Physician: [name]  Admission Diagnoses: vomiting-post op  Discharge Diagnoses:   postop ileus versus partial small bowel obstruction, resolved.  Postoperative day #16, status post open ventral hernia repair with mesh and extensive lysis of adhesions.  Asthma  Hypertension  Atrial fibrillation with rapid ventricular response, resolve    Operations: none  Admission Condition: fair  Discharged Condition: good  Indication for Admission: this is a 65 year old male who is a 1-1/2 years status post laparoscopic right colectomy for colon cancer,  adjuvant chemotherapy and recovery without known recurrence. He recently underwent a laparoscopic converted to open, extensive lysis of adhesions and ventral hernia repair with mesh. He was discharged home 48 hours prior to this admission but had to be readmitted for abdominal pain nausea and vomiting. Emergency room workup was concerning for partial small bowel obstruction with transition zone in the mid abdomen under the mesh. There were no obvious wound or mesh complications.  Hospital Course: the patient was evaluated in the emergency department and admitted by Dr. Karie Soda. I assumed his care the following day. He was treated with bowel rest, nasogastric suction and attention to fluid and electrolyte management. He became comfortable and pain-free but did not have a bowel movement for about 3 or 4 days. We did give him an enema and he had a good bowel movement and thereafter he improved on a daily basis with resumption of normal, spontaneous bowel function, ability to remove the nasogastric tube and advance his diet. He was ambulatory and comfortable.  During the hospitalization we did start him on hyperalimentation through a PICC line which he tolerated  well.  On the day of discharge the PICC line was removed. The patient looked well and was asking to go home. He had tolerated regular diet and was having spontaneous bowel movements. His midline incision was healing well without any signs of infection or drainage.  He has a prescription for Vicodin, so no new prescriptions were written. He will otherwise continue his usual medications. Diet and activities were discussed. He will follow up in the office in 3 weeks.  Consults: none  Significant Diagnostic Studies: abdominal x-rays and CT scan  Treatments: IV hydration, nasogastric suction, antibiotics, hyperalimentation.  Disposition: Home  Patient Instructions:   Christian Harrison, Christian Harrison  Home Medication Instructions [id number]   Printed on:09/26/11 0556  Medication Information                    tiotropium (SPIRIVA) 18 MCG inhalation capsule Place 18 mcg into inhaler and inhale daily.             budesonide-formoterol (SYMBICORT) 80-4.5 MCG/ACT inhaler Inhale 2 puffs into the lungs 2 (two) times daily.             citalopram (CELEXA) 40 MG tablet Take 40 mg by mouth every morning.            Multiple Vitamins-Minerals (MULTIVITAMINS THER. W/MINERALS) TABS Take 1 tablet by mouth daily.             albuterol (PROVENTIL HFA;VENTOLIN HFA) 108 (90 BASE) MCG/ACT inhaler Inhale 2 puffs into the lungs every 6 (six) hours as needed. For shortness of breath            lisinopril-hydrochlorothiazide (PRINZIDE,ZESTORETIC) 20-25 MG per tablet Take 1 tablet by mouth every morning.  albuterol (PROVENTIL) (2.5 MG/3ML) 0.083% nebulizer solution Take 2.5 mg by nebulization every 6 (six) hours as needed. For shortness of breath            zolpidem (AMBIEN) 10 MG tablet Take 10 mg by mouth at bedtime as needed. For sleep            carvedilol (COREG) 6.25 MG tablet Take 1 tablet (6.25 mg total) by mouth 2 (two) times daily with a meal.           HYDROcodone-acetaminophen (NORCO) 5-325  MG per tablet Take 1-2 tablets by mouth every 4 (four) hours as needed for pain.             Activity: no heavy lifting for 5 weeks Diet: diabetic diet Wound Care: as directed  Follow-up:  With Dr. Derrell Lolling in 3 weeks.  Signed: Angelia Mould. Derrell Lolling, M.D., FACS General and minimally invasive surgery Breast and Colorectal Surgery  09/26/2011, 5:56 AM

## 2011-09-26 NOTE — Progress Notes (Signed)
Subjective: POD #16  Patient feels and looks well. Had another good bowel movement spontaneously. Tolerating regular diet. Denies pain. Denies nausea. Denies cramps.Begging to go home.  Objective: Vital signs in last 24 hours: Temp:  [98 F (36.7 C)-98.2 F (36.8 C)] 98 F (36.7 C) (12/20 2154) Pulse Rate:  [88-91] 88  (12/20 2154) Resp:  [18-22] 22  (12/20 2154) BP: (115-124)/(81-84) 115/81 mmHg (12/20 2154) SpO2:  [96 %] 96 % (12/20 2154) Last BM Date: 09/25/11  Intake/Output from previous day: 12/20 0701 - 12/21 0700 In: 360 [P.O.:240; I.V.:120] Out: 1920 [Urine:1920] Intake/Output this shift: Total I/O In: 340 [P.O.:240; I.V.:100] Out: 300 [Urine:300]  GI: abdomen is soft, nontender, nondistended. Active bowel sounds. Midline wound with Steri-Strips in place. No sign of infection or drainage. There are staples on the left side in a couple of trocar sites which we will remove.  Lab Results:  Results for orders placed during the hospital encounter of 09/18/11 (from the past 24 hour(s))  COMPREHENSIVE METABOLIC PANEL     Status: Abnormal   Collection Time   09/25/11  6:20 AM      Component Value Range   Sodium 135  135 - 145 (mEq/L)   Potassium 3.8  3.5 - 5.1 (mEq/L)   Chloride 103  96 - 112 (mEq/L)   CO2 25  19 - 32 (mEq/L)   Glucose, Bld 142 (*) 70 - 99 (mg/dL)   BUN 11  6 - 23 (mg/dL)   Creatinine, Ser 1.19  0.50 - 1.35 (mg/dL)   Calcium 9.5  8.4 - 14.7 (mg/dL)   Total Protein 6.4  6.0 - 8.3 (g/dL)   Albumin 3.2 (*) 3.5 - 5.2 (g/dL)   AST 15  0 - 37 (U/L)   ALT 17  0 - 53 (U/L)   Alkaline Phosphatase 73  39 - 117 (U/L)   Total Bilirubin 0.4  0.3 - 1.2 (mg/dL)   GFR calc non Af Amer 89 (*) >90 (mL/min)   GFR calc Af Amer >90  >90 (mL/min)  MAGNESIUM     Status: Normal   Collection Time   09/25/11  6:20 AM      Component Value Range   Magnesium 2.2  1.5 - 2.5 (mg/dL)  PHOSPHORUS     Status: Normal   Collection Time   09/25/11  6:20 AM      Component  Value Range   Phosphorus 3.3  2.3 - 4.6 (mg/dL)  GLUCOSE, CAPILLARY     Status: Abnormal   Collection Time   09/25/11  7:40 AM      Component Value Range   Glucose-Capillary 128 (*) 70 - 99 (mg/dL)  GLUCOSE, CAPILLARY     Status: Abnormal   Collection Time   09/25/11  7:25 PM      Component Value Range   Glucose-Capillary 145 (*) 70 - 99 (mg/dL)     Studies/Results: @RISRSLT24 @     . budesonide-formoterol  2 puff Inhalation BID  . carvedilol  6.25 mg Oral BID WC  . citalopram  40 mg Oral Daily  . Flora-Q  1 capsule Oral Daily  . heparin  5,000 Units Subcutaneous Q8H  . hydrochlorothiazide  25 mg Oral Daily  . insulin aspart  0-15 Units Subcutaneous Q12H  . lip balm  1 application Topical BID  . lisinopril  20 mg Oral Daily  . pantoprazole  40 mg Oral BID AC  . sodium chloride  10 mL Intracatheter Q12H  . tiotropium  18  mcg Inhalation Daily  . DISCONTD: lisinopril-hydrochlorothiazide  1 tablet Oral Q0700  . DISCONTD: pantoprazole (PROTONIX) IV  40 mg Intravenous Q12H     Assessment/Plan:  Postoperative day #16 following open ventral hernia repair with mesh and extensive lysis of adhesions. No sign of infection or wound problem.  Postoperative partial small bowel structure versus ileus. This has now resolved with conservative therapy. He is ready for discharge home.  Atrial fibrillation with rapid ventricular response, resolved. On Coreg tablets for control. Followed by Carolanne Grumbling from a cardiac standpoint.  Asthma. Stable.  Hypertension. Stable.  He will be discharged home today. There are no new prescriptions. Continue his usual medications. No sports or heavy lifting for 5 weeks. Return to see me in 3 weeks.      LOS: 8 days    Candy Leverett M 09/26/2011  . .prob

## 2011-10-02 ENCOUNTER — Telehealth (INDEPENDENT_AMBULATORY_CARE_PROVIDER_SITE_OTHER): Payer: Self-pay | Admitting: General Surgery

## 2011-10-02 NOTE — Telephone Encounter (Signed)
PT'S WIFE CALLED TO REQ pain medication refill after recent surgery/ [facility]/ [phone] called to fill hydrocodone 5/325 mg po q 4-6 hrs prn pain/ #30, no refills and generic ok/ standard post-op pain med/gy/ pt's wife aware.

## 2011-10-02 NOTE — Telephone Encounter (Signed)
Pt just got out of hospital, needs a 1-2wk po, is ok to be seen until the 18th? Please call. (also put on cancellation list)

## 2011-10-24 ENCOUNTER — Ambulatory Visit (INDEPENDENT_AMBULATORY_CARE_PROVIDER_SITE_OTHER): Payer: Medicare Other | Admitting: General Surgery

## 2011-10-24 ENCOUNTER — Encounter (INDEPENDENT_AMBULATORY_CARE_PROVIDER_SITE_OTHER): Payer: Self-pay | Admitting: General Surgery

## 2011-10-24 ENCOUNTER — Encounter (INDEPENDENT_AMBULATORY_CARE_PROVIDER_SITE_OTHER): Payer: Medicare Other | Admitting: General Surgery

## 2011-10-24 VITALS — BP 116/78 | HR 70 | Temp 97.8°F | Resp 18 | Ht 72.0 in | Wt 207.4 lb

## 2011-10-24 DIAGNOSIS — Z9889 Other specified postprocedural states: Secondary | ICD-10-CM

## 2011-10-24 NOTE — Progress Notes (Signed)
Subjective:     Patient ID: Christian Harrison, male   DOB: 03/13/1946, 66 y.o.   MRN: [id number]  HPI This 66 year old Caucasian male returns for postop followup.  History is significant for a laparoscopic right colectomy on March 21, 2010. He had colon cancer, pathologic stage T3, N1b.  He had a Port-A-Cath placed and chemotherapy. He has no known recurrence today. He had a colonoscopy earlier this year which apparently looked fine. He states that he has been discharged from Dr. Nida Boatman Sherrill's care.  On September 10, 2011 he had a open repair of a complex multi-chambered ventral incisional hernia and extensive lysis of adhesions. He had to be readmitted with a postop ileus which resolved. He now feels fine. He has no complaints about his wound except he notices that there is one staple still present. He is eating fine. Has a daily bowel movement. No diarrhea. No bowel pain.  Review of Systems     Objective:   Physical Exam The patient is alert and in no distress. His wife is with him.  Abdomen soft and nontender. Lower midline scar well healed. Hernia repair intact. No sign of recurrence. No wound problems. There is one staple in the lower midline which I removed.    Assessment:     Adenocarcinoma of the ascending colon, pathologic stage 3I, N1b. Status post right colectomy, Port-A-Cath placement, adjuvant chemotherapy. Doing well  6 weeks postop open ventral incisional hernia repair, complex. No apparent surgical complications or wound complications. GI function has normalized.  Asthma  Hypertension  Coronary artery disease    Plan:     I advised him that he would need periodic colonoscopies, and to discuss that with Dr. Evette Cristal.  Return to see me in one year. At that time we will get a CBC, complete metabolic panel, CEA, and CT scan of the abdomen and pelvis with contrast.  Continue medical followup Dr. Windle Guard.   Angelia Mould. Derrell Lolling, M.D., [facility] General and Minimally invasive Surgery Breast and Colorectal Surgery Office:   [phone] Pager:   [phone]

## 2011-10-24 NOTE — Patient Instructions (Signed)
You seem to have recovered uneventfully from the ventral hernia repair. You may resume all normal physical activities , but I would avoid very strenuous activities or heavy lifting.  Return to see me in one year. We will get blood work and a CAT scan of your abdomen at that time.  You will need periodic colonoscopies for colorectal cancer screening, and I ask you to discuss when this is to be done with Dr. Wandalee Ferdinand.

## 2012-04-30 ENCOUNTER — Other Ambulatory Visit: Payer: Self-pay | Admitting: Family Medicine

## 2012-04-30 DIAGNOSIS — R1011 Right upper quadrant pain: Secondary | ICD-10-CM

## 2012-05-05 ENCOUNTER — Ambulatory Visit
Admission: RE | Admit: 2012-05-05 | Discharge: 2012-05-05 | Disposition: A | Discharge status: Home / Self Care | Payer: Medicare Other | Source: Ambulatory Visit | Attending: Family Medicine | Admitting: Family Medicine

## 2012-05-05 DIAGNOSIS — R1011 Right upper quadrant pain: Secondary | ICD-10-CM

## 2012-10-12 ENCOUNTER — Ambulatory Visit (INDEPENDENT_AMBULATORY_CARE_PROVIDER_SITE_OTHER): Payer: Medicare Other | Admitting: General Surgery

## 2012-10-27 IMAGING — CR DG ABDOMEN 2V
3 series · 3 of 3 positions shown · non-contrast
Comparison: CT abdomen and pelvis of 09/19/2011 and chest with
acute abdomen of 09/18/2011

CLINICAL DATA: Partial small bowel obstruction, follow-up

ABDOMEN - 2 VIEW

[w abdomen decub]
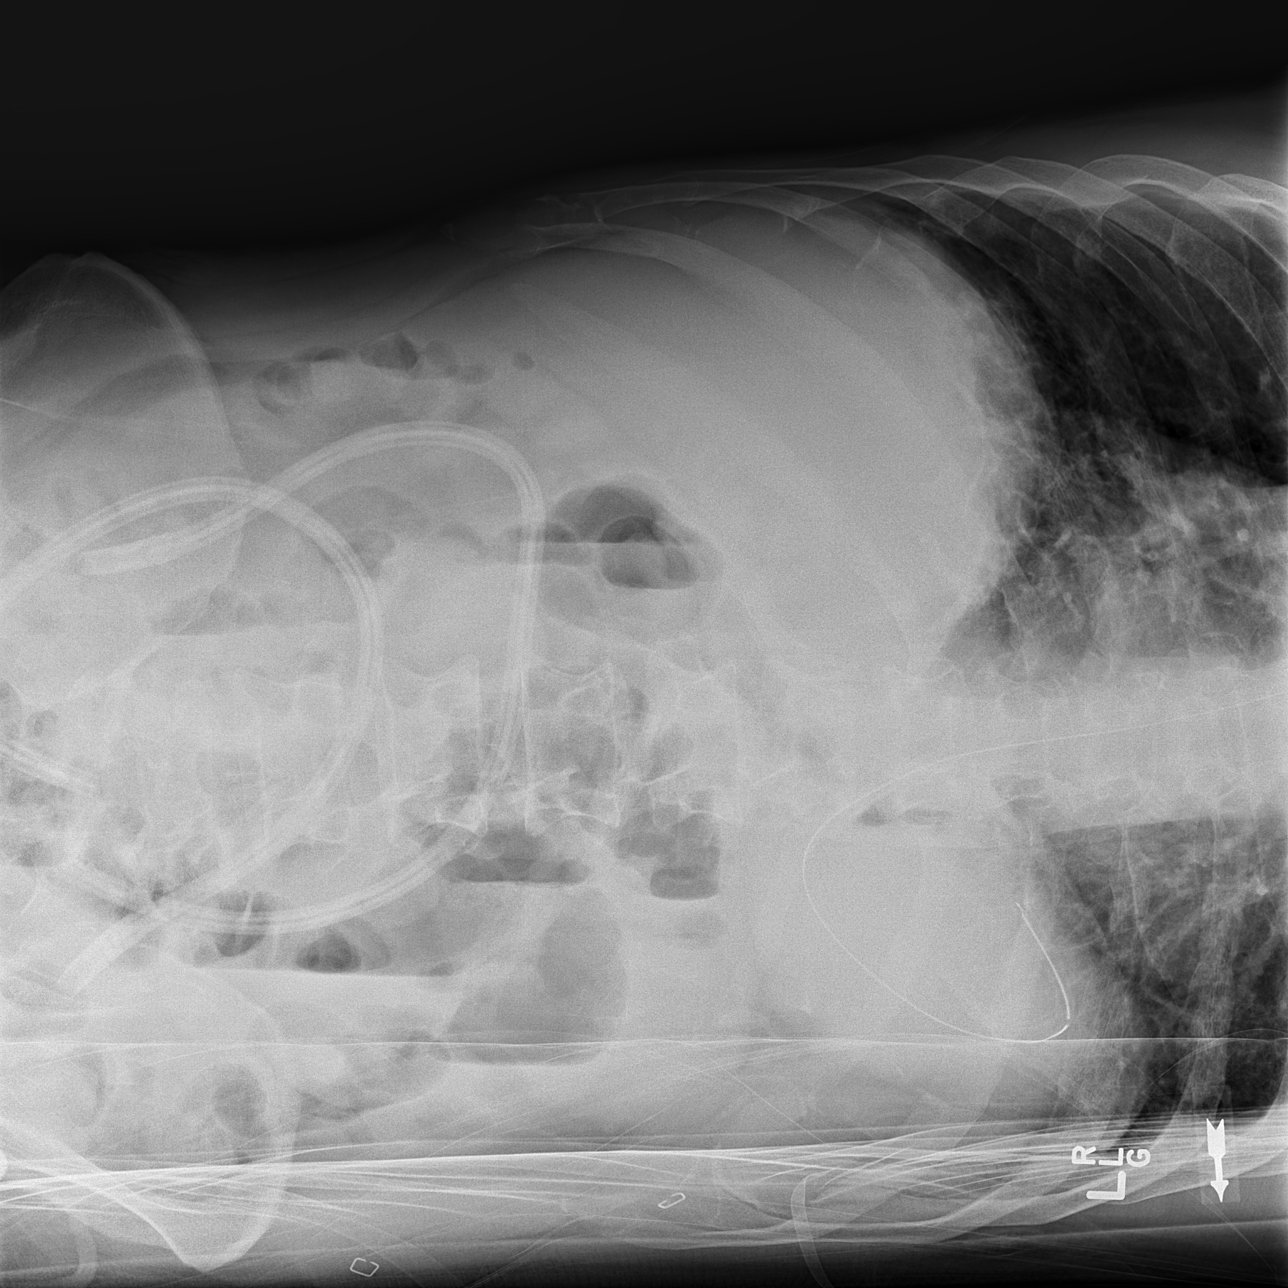

[x abdomen supine (1 of 2)]
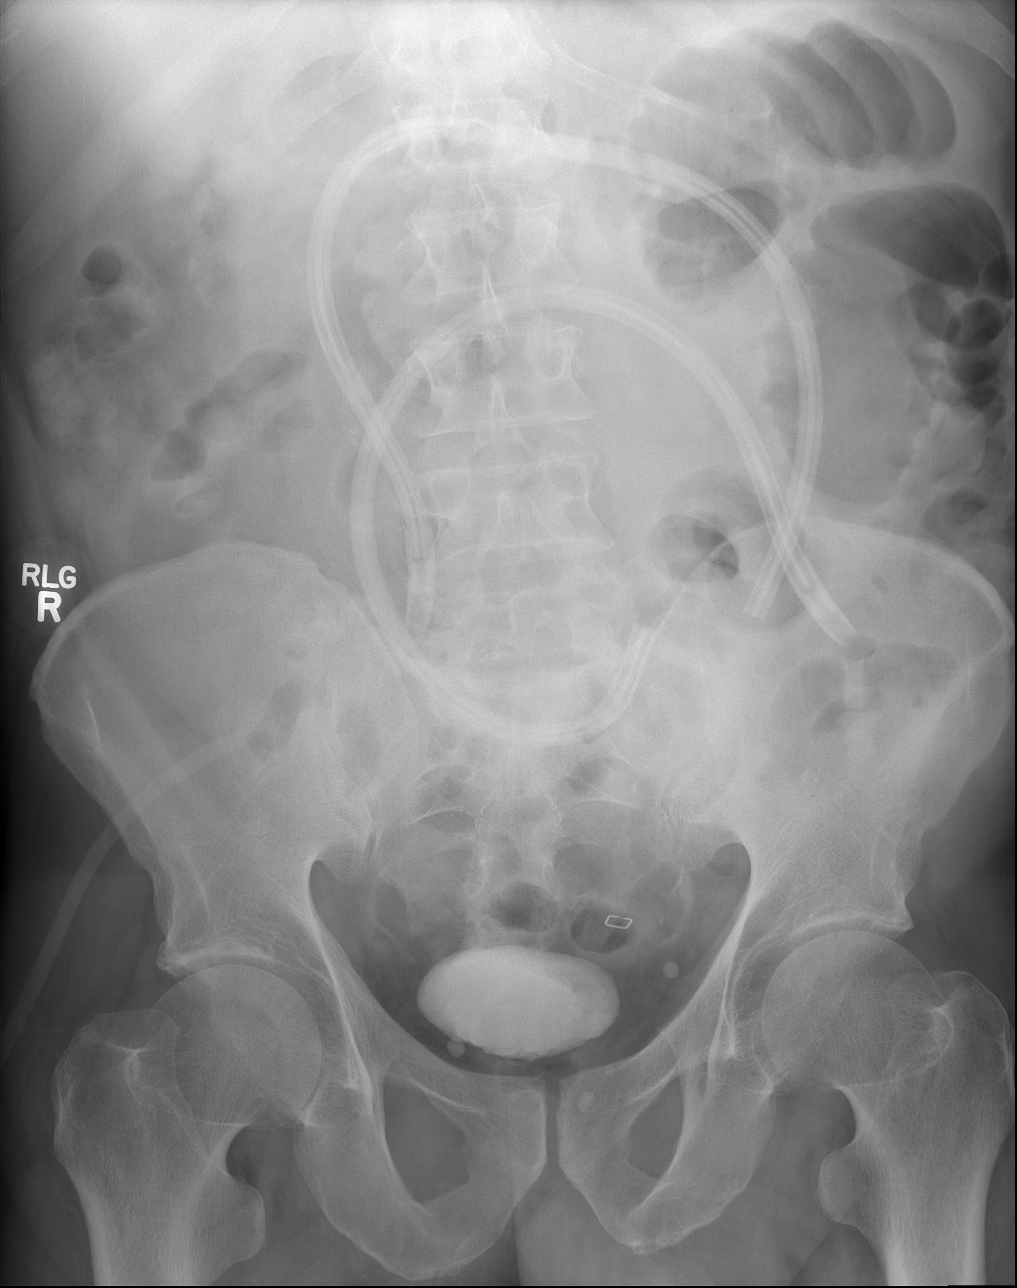

[x abdomen supine (2 of 2)]
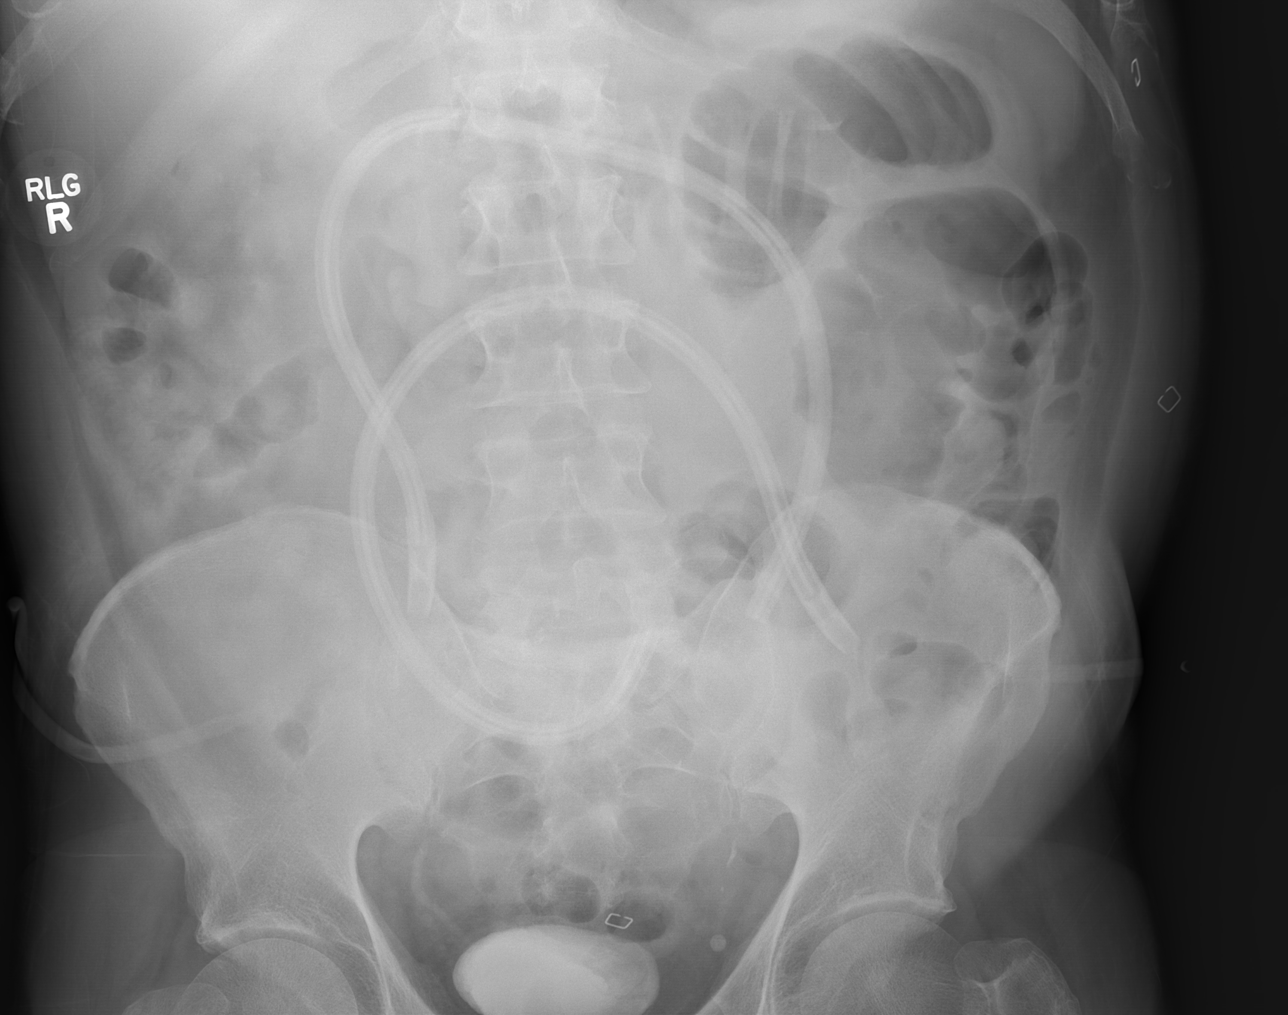

[3 of 3 positions shown; findings below may reference images not displayed]

FINDINGS: There are still significantly dilated loops of what
appears to be small bowel within the left upper quadrant consistent
with partial small bowel obstruction.  No colonic bowel gas is
seen.  On the decubitus image no free air is noted.  A surgical
drain is present and an NG tube is present coiled in the fundus of
the stomach.
IMPRESSION: Little change in partial small bowel obstructive pattern.  No free
air.

## 2012-10-29 IMAGING — CR DG ABDOMEN 2V
3 series · 3 of 3 positions shown · non-contrast
Comparison: 09/20/2011; 09/19/2011; abdominal CT - [DATE]

CLINICAL DATA: Partial small bowel obstruction.

ABDOMEN - 2 VIEW

[w abdomen upright *]
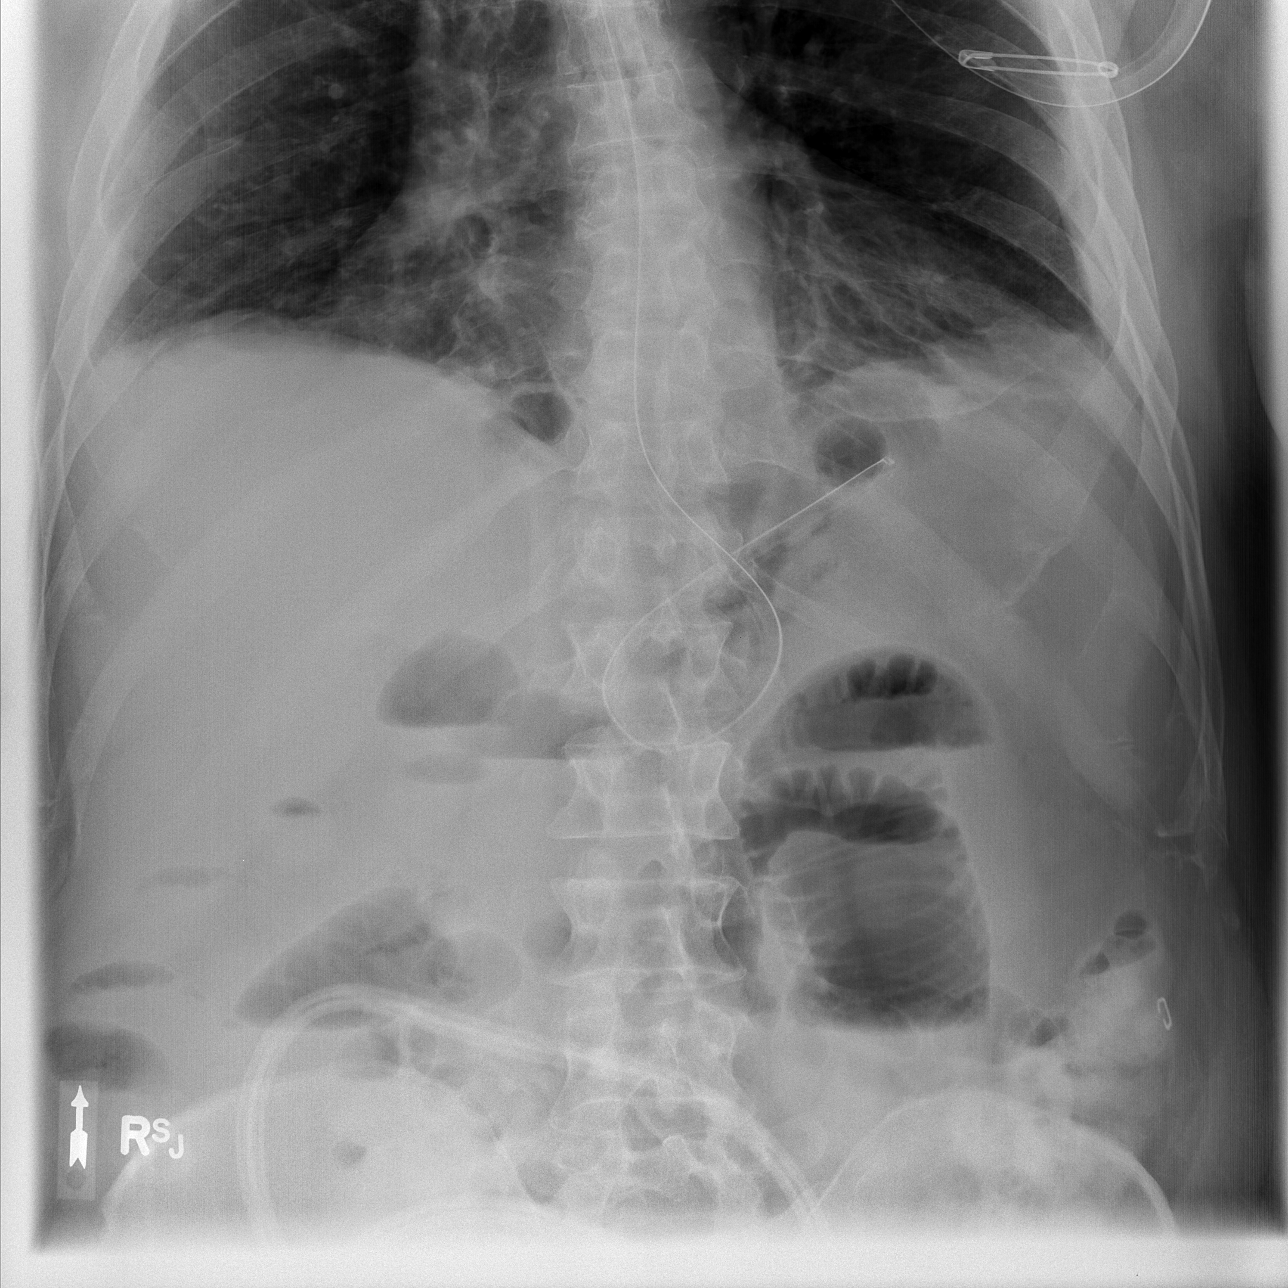

[t abdomen supine (1 of 2)]
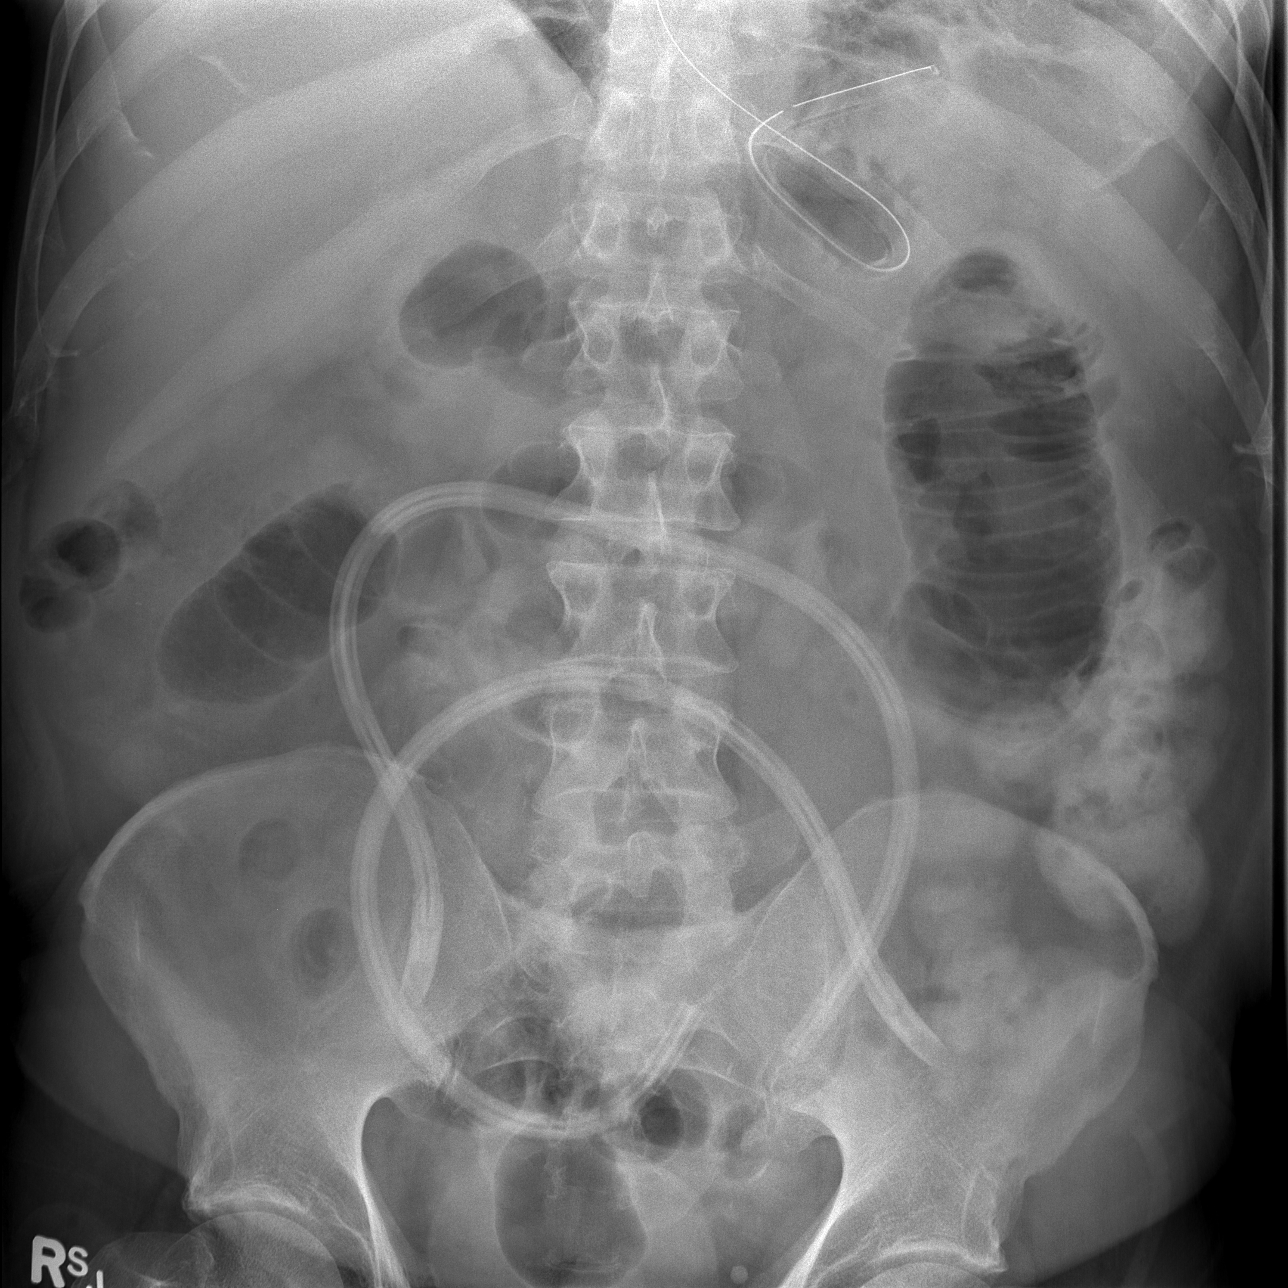

[t abdomen supine (2 of 2)]
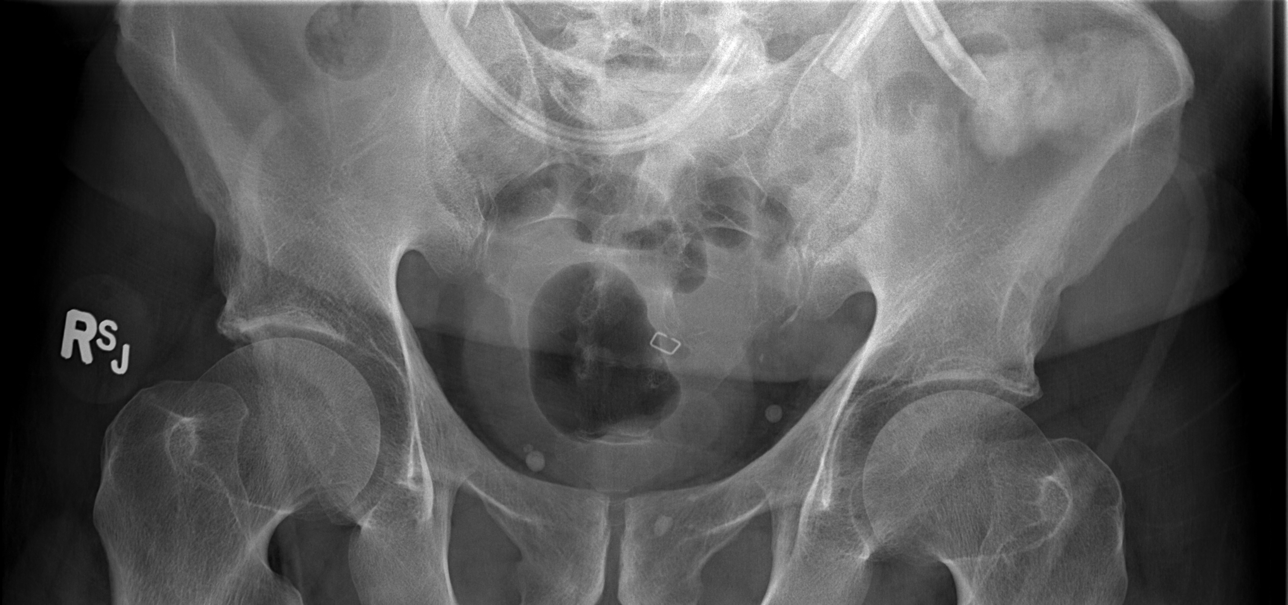

[3 of 3 positions shown; findings below may reference images not displayed]

FINDINGS: Increased gas distension of a loop of small bowel within
left mid hemiabdomen, now measuring approximately 6.9 cm in
diameter.  There are possible air fluid levels seen on the provided
upright radiograph. Grossly unchanged in character contrast within
the transverse and descending colon.  No pneumoperitoneum,
pneumatosis or portal venous gas.  Enteric tube overlies left upper
abdominal quadrant.  Surgical drain overlies the midline of the
lower abdomen.  Limited visualization of the lower thorax
demonstrates small bilateral effusions and bibasilar opacities,
grossly unchanged.  Grossly unchanged bones.
IMPRESSION: Increased gaseous distension of a loop of small bowel within the
left mid hemiabdomen with unchanged enteric contrast within the
transverse and descending colon, findings suggestive of worsening
small bowel obstruction.

## 2012-11-22 ENCOUNTER — Ambulatory Visit (INDEPENDENT_AMBULATORY_CARE_PROVIDER_SITE_OTHER): Payer: Medicare Other | Admitting: General Surgery

## 2012-11-22 ENCOUNTER — Encounter (INDEPENDENT_AMBULATORY_CARE_PROVIDER_SITE_OTHER): Payer: Self-pay | Admitting: General Surgery

## 2012-11-22 ENCOUNTER — Telehealth (INDEPENDENT_AMBULATORY_CARE_PROVIDER_SITE_OTHER): Payer: Self-pay | Admitting: General Surgery

## 2012-11-22 ENCOUNTER — Other Ambulatory Visit (INDEPENDENT_AMBULATORY_CARE_PROVIDER_SITE_OTHER): Payer: Self-pay | Admitting: General Surgery

## 2012-11-22 VITALS — BP 130/86 | HR 64 | Temp 98.2°F | Resp 18 | Ht 72.0 in | Wt 218.4 lb

## 2012-11-22 DIAGNOSIS — K430 Incisional hernia with obstruction, without gangrene: Secondary | ICD-10-CM

## 2012-11-22 DIAGNOSIS — Z85038 Personal history of other malignant neoplasm of large intestine: Secondary | ICD-10-CM

## 2012-11-22 NOTE — Telephone Encounter (Signed)
Tried to reach patient on cell phone to advise information relating to the tests ordered will be mailed to his home. The patient is supposed to come back to see Dr. Derrell Lolling in one year. Dr. Derrell Lolling requested the patient have CBC, CMP, CEA and CT abdomen/pelvis with contrast. Labs at [facility] and CT at [facility]. Patient given direct number to [facility] in order to set up a day and time that works for his schedule. Patient will need to pick up the contrast prior to appointment as the patient left our office without it. A note will be included in the information sent to the patient to advise.

## 2012-11-22 NOTE — Progress Notes (Signed)
Patient ID: Christian Harrison, male   DOB: Apr 24, 1946, 67 y.o.   MRN: [id number]  Chief Complaint  Patient presents with  . Follow-up    HPI Christian Harrison is a 67 y.o. male.  He returns for long-term followup regarding his colon cancer and ventral hernia repair.  On 03/21/2010 he underwent a laparoscopic-assisted right colectomy for colon cancer. This was a T3, N1 B., stage IIIB colon cancer.  We put a Port-A-Cath in, he received chemotherapy, and a Port-A-Cath has been removed. Dr. Mancel Bale felt that he was doing well and was discharged from his care.  On 09/10/2011 the patient underwent open repair of a complex ventral incisional hernia with mesh and extensive lysis of adhesions. He had a postop ileus but ultimately recovered.  Last colonoscopy was 08/04/2011 and it looked good. This was performed by Dr. Evette Cristal. He is due for another colonoscopy in October 2015.  He is now being followed by Windle Guard and me.  Symptomatically he feels fine. His appetite is excellent. His bowel function is normal. He has no bowel pain or wound problems. He feels that his health is stable. He states his energy level is less and his interest in other activities is less. I told him this might be age-related or anxiety and depression and he should discuss that with Dr. Jeannetta Nap. HPI  Past Medical History  Diagnosis Date  . Hypertension   . Hernia   . Asthma 09-08-11    controlled with inhalers  . Colon cancer 09-08-11    colon ca dx 02/2010. Chemo x 12 sessions  . COPD (chronic obstructive pulmonary disease) 09-08-11    previous smoker  . 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 Incisional hernia s/p open VWH repair w mesh [id number] 09/13/2011  . Complication of anesthesia     ? pneumonia after surgery in 2011  . Hiatal hernia   . Acid reflux     Past Surgical History  Procedure Laterality Date  . Hand surgery      LEFT  . Colon surgery  09-08-11    surgery  in 6'11  . Dg finger*l*  09-08-11    left index finger-tendon release  . Ventral hernia repair  09/10/2011    Procedure: LAPAROSCOPIC VENTRAL HERNIA;  Surgeon: [name], MD;  Location: [facility];  Service: General;  Laterality: N/A;  Lysis of Adhessions, Converted to open, with Physio Mesh  . Port-a-cath removal  09/10/2011    Procedure: REMOVAL PORT-A-CATH;  Surgeon: [name], MD;  Location: [facility];  Service: General;  Laterality: N/A;  Removal Port-a-Cath    Family History  Problem Relation Age of Onset  . Heart disease Mother     Social History History  Substance Use Topics  . Smoking status: Former Smoker -- 1 years    Quit date: 09/18/1970  . Smokeless tobacco: Never Used  . Alcohol Use: No    Allergies  Allergen Reactions  . Penicillins Anaphylaxis and Other (See Comments)    Seizures    Current Outpatient Prescriptions  Medication Sig Dispense Refill  . albuterol (PROVENTIL HFA;VENTOLIN HFA) 108 (90 BASE) MCG/ACT inhaler Inhale 2 puffs into the lungs every 6 (six) hours as needed. For shortness of breath       . albuterol (PROVENTIL) (2.5 MG/3ML) 0.083% nebulizer solution Take 2.5 mg by nebulization every 6 (six) hours as needed. For shortness of breath       .  budesonide-formoterol (SYMBICORT) 80-4.5 MCG/ACT inhaler Inhale 2 puffs into the lungs 2 (two) times daily.        . carvedilol (COREG) 6.25 MG tablet Take 1 tablet (6.25 mg total) by mouth 2 (two) times daily with a meal.  60 tablet  2  . lisinopril-hydrochlorothiazide (PRINZIDE,ZESTORETIC) 20-25 MG per tablet Take 1 tablet by mouth every morning.        . Multiple Vitamins-Minerals (MULTIVITAMINS THER. W/MINERALS) TABS Take 1 tablet by mouth daily.        Marland Kitchen tiotropium (SPIRIVA) 18 MCG inhalation capsule Place 18 mcg into inhaler and inhale daily.        Marland Kitchen zolpidem (AMBIEN) 10 MG tablet Take 10 mg by mouth at bedtime as needed. For sleep        No current facility-administered medications for this  visit.    Review of Systems Review of Systems  Constitutional: Negative for fever, chills and unexpected weight change.  HENT: Negative for hearing loss, congestion, sore throat, trouble swallowing and voice change.   Eyes: Negative for visual disturbance.  Respiratory: Negative for cough and wheezing.   Cardiovascular: Negative for chest pain, palpitations and leg swelling.  Gastrointestinal: Negative for nausea, vomiting, abdominal pain, diarrhea, constipation, blood in stool, abdominal distention, anal bleeding and rectal pain.  Genitourinary: Negative for hematuria and difficulty urinating.  Musculoskeletal: Negative for arthralgias.  Skin: Negative for rash and wound.  Neurological: Negative for seizures, syncope, weakness and headaches.  Hematological: Negative for adenopathy. Does not bruise/bleed easily.  Psychiatric/Behavioral: Negative for confusion.    Blood pressure 130/86, pulse 64, temperature 98.2 F (36.8 C), temperature source Temporal, resp. rate 18, height 6' (1.829 m), weight 218 lb 6.4 oz (99.066 kg).  Physical Exam Physical Exam  Constitutional: He is oriented to person, place, and time. He appears well-developed and well-nourished. No distress.  HENT:  Head: Normocephalic.  Nose: Nose normal.  Mouth/Throat: No oropharyngeal exudate.  Eyes: Conjunctivae and EOM are normal. Pupils are equal, round, and reactive to light. Right eye exhibits no discharge. Left eye exhibits no discharge. No scleral icterus.  Neck: Normal range of motion. Neck supple. No JVD present. No tracheal deviation present. No thyromegaly present.  Cardiovascular: Normal rate, regular rhythm, normal heart sounds and intact distal pulses.   No murmur heard. Pulmonary/Chest: Effort normal and breath sounds normal. No stridor. No respiratory distress. He has no wheezes. He has no rales. He exhibits no tenderness.  Abdominal: Soft. Bowel sounds are normal. He exhibits no distension and no mass.  There is no tenderness. There is no rebound and no guarding.  Well healed midline scar. Hernia repair intact. No organomegaly. No tenderness.  Genitourinary:  No inguinal adenopathy no inguinal hernia  Musculoskeletal: Normal range of motion. He exhibits no edema and no tenderness.  Lymphadenopathy:    He has no cervical adenopathy.  Neurological: He is alert and oriented to person, place, and time. He has normal reflexes. Coordination normal.  Skin: Skin is warm and dry. No rash noted. He is not diaphoretic. No erythema. No pallor.  Psychiatric: He has a normal mood and affect. His behavior is normal. Judgment and thought content normal.    Data Reviewed Old office records, cancer Center diets, imaging studies  Assessment    Invasive adenocarcinoma of the ascending colon, stage T3,, N1b, stage IIIB. No clinical evidence of recurrence 2-1/2 years following laparoscopic-assisted right colectomy and adjuvant chemotherapy. No recent screening evaluation or testing done.  Complex ventral incisional hernia, uneventful recovery  without clinical evidence of recurrence 14 months following open repair with mesh  Asthma  Hypertension  Coronary disease    Plan    We will go ahead with screening tests including CBC, Cmet, CEA, and CT scan abdomen and pelvis.  We will call these results to him  He is advised to get a colonoscopy with Dr, Evette Cristal in October 2015  Return to see me in one year. I told him that I will follow him for 5 years from the date of his colon cancer surgery.        Angelia Mould. Derrell Lolling, M.D., [facility] General and Minimally invasive Surgery Breast and Colorectal Surgery Office:   [phone] Pager:   [phone]  11/22/2012, 12:53 PM

## 2012-11-22 NOTE — Patient Instructions (Signed)
Your physical exam today is normal. There is no evidence of cancer.  The hernia repair is also intact. No sign of any complication.  You will be scheduled for lab work and a CT scan of the abdomen.  You should have another colonoscopy in October of 2015 with Dr. Evette Cristal.  Return to see Dr. Derrell Lolling in one year.

## 2012-12-07 ENCOUNTER — Inpatient Hospital Stay: Admission: RE | Admit: 2012-12-07 | Payer: Medicare Other | Source: Ambulatory Visit

## 2012-12-14 ENCOUNTER — Ambulatory Visit
Admission: RE | Admit: 2012-12-14 | Discharge: 2012-12-14 | Disposition: A | Discharge status: Home / Self Care | Payer: Medicare Other | Source: Ambulatory Visit | Attending: General Surgery | Admitting: General Surgery

## 2012-12-14 ENCOUNTER — Telehealth (INDEPENDENT_AMBULATORY_CARE_PROVIDER_SITE_OTHER): Payer: Self-pay

## 2012-12-14 LAB — COMPREHENSIVE METABOLIC PANEL
AST: 21 U/L (ref 0–37)
Alkaline Phosphatase: 73 U/L (ref 39–117)
BUN: 28 mg/dL — ABNORMAL HIGH (ref 6–23)
Creat: 1.06 mg/dL (ref 0.50–1.35)
Total Bilirubin: 0.7 mg/dL (ref 0.3–1.2)

## 2012-12-14 MED ORDER — IOHEXOL 300 MG/ML  SOLN
125.0000 mL | Freq: Once | INTRAMUSCULAR | Status: AC | PRN
  Administered 2012-12-14: 125 mL via INTRAVENOUS

## 2012-12-14 NOTE — Telephone Encounter (Signed)
I called and spoke to the patient's wife and gave her the message on the CT results, no evidence of cancer.

## 2012-12-14 NOTE — Telephone Encounter (Signed)
Message copied by Ivory Broad on Tue Dec 14, 2012 11:36 AM ------      Message from: [name]      Created: Tue Dec 14, 2012 10:50 AM       Call radiology reports to patient.Tell him that the CT scan shows no evidence of cancer. This is good news. ------

## 2012-12-15 ENCOUNTER — Encounter (INDEPENDENT_AMBULATORY_CARE_PROVIDER_SITE_OTHER): Payer: Self-pay

## 2012-12-15 LAB — CBC
HCT: 44.9 % (ref 39.0–52.0)
MCH: 32.4 pg (ref 26.0–34.0)
MCHC: 35.4 g/dL (ref 30.0–36.0)
MCV: 91.6 fL (ref 78.0–100.0)
RDW: 13.8 % (ref 11.5–15.5)

## 2013-03-01 ENCOUNTER — Telehealth (INDEPENDENT_AMBULATORY_CARE_PROVIDER_SITE_OTHER): Payer: Self-pay

## 2013-03-01 NOTE — Telephone Encounter (Signed)
I called and spoke to the wife. She states it's been going on for one month.  He has no fever but pain in his stomach.  There is no bleeding with bowel movements.  I had an opening for Thursday and gave that to her for 10:45.

## 2013-03-01 NOTE — Telephone Encounter (Signed)
Message copied by Ivory Broad on Tue Mar 01, 2013  9:49 AM ------      Message from: Rise Paganini      Created: Tue Mar 01, 2013  8:41 AM      Regarding: Marnette Burgess: [phone]       Patient is having pain & pus from the rectum area that appears clear at times. Patient's wife stated that the first available appointment on 03/23/13 was too long to wait. Please call. Thank you. ------

## 2013-03-03 ENCOUNTER — Ambulatory Visit (INDEPENDENT_AMBULATORY_CARE_PROVIDER_SITE_OTHER): Payer: Medicare Other | Admitting: General Surgery

## 2013-03-03 ENCOUNTER — Encounter (INDEPENDENT_AMBULATORY_CARE_PROVIDER_SITE_OTHER): Payer: Self-pay | Admitting: General Surgery

## 2013-03-03 VITALS — BP 142/80 | HR 76 | Temp 97.2°F | Resp 16 | Ht 70.5 in | Wt 213.2 lb

## 2013-03-03 DIAGNOSIS — K430 Incisional hernia with obstruction, without gangrene: Secondary | ICD-10-CM

## 2013-03-03 DIAGNOSIS — R197 Diarrhea, unspecified: Secondary | ICD-10-CM | POA: Insufficient documentation

## 2013-03-03 DIAGNOSIS — Z85038 Personal history of other malignant neoplasm of large intestine: Secondary | ICD-10-CM

## 2013-03-03 NOTE — Patient Instructions (Signed)
Your examination today does not reveal any acute surgical problem with your abdomen or your rectal area.  Dr. Derrell Lolling thinks that the biggest problem is the change in your bowel movements which were previously normal, have now changed to frequent loose stools. There are many causes for this.  You will be referred back to your gastroenterologist for evaluation and management of your diarrhea.  Return to see Dr. Derrell Lolling in February, 2015 for a cancer check.

## 2013-03-03 NOTE — Progress Notes (Signed)
Patient ID: Christian Harrison, male   DOB: 03-21-1946, 67 y.o.   MRN: [id number]  Chief Complaint  Patient presents with  . Routine Post Op    pain in abd    HPI Christian Harrison is a 67 y.o. male.  He returns to see me because of a change in his bowel movements.  On 03/21/2010 he underwent a laparoscopic-assisted right colectomy for colon cancer. This was a T3, N1 B., stage IIIB colon cancer. We put a Port-A-Cath in, he received chemotherapy, and a Port-A-Cath has been removed. Dr. Mancel Bale felt that he was doing well and was discharged from his care.   On 09/10/2011 the patient underwent open repair of a complex ventral incisional hernia with mesh and extensive lysis of adhesions. He had a postop ileus but ultimately recovered.   Last colonoscopy was 08/04/2011 and it looked good. This was performed by Dr. Evette Cristal. He is due for another colonoscopy in October 2015.   I last saw him in February, 2014. He looked good. CEA 1.5. Liver function tests normal. Hemoglobin 15.9. CT scan showed no evidence of cancer, intra-abdominal adenopathy, or abdominal wall problems.He had been having regular well-formed stools since his surgery.  He now states that for the past 2 months he is having frequent stools. They are mushy and soft, not watery. 5-6 per day. Has tenesmus and drainage. Also passing much more flatus than usual. His abdomen is not painful but he says it's a little bit uncomfortable and his appetite is less than what it used to be and he's lost a few pounds. No fever or chills. He saw Dr. Jeannetta Nap and reportedly had stool studies and blood work which was unremarkable. He was referred for evaluation. He is in no acute distress today. His wife is with him.  HPI  Past Medical History  Diagnosis Date  . Hypertension   . Hernia   . Asthma 09-08-11    controlled with inhalers  . Colon cancer 09-08-11    colon ca dx 02/2010. Chemo x 12 sessions  . COPD (chronic obstructive pulmonary disease) 09-08-11    previous smoker  . 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 Incisional hernia s/p open VWH repair w mesh [id number] 09/13/2011  . Complication of anesthesia     ? pneumonia after surgery in 2011  . Hiatal hernia   . Acid reflux     Past Surgical History  Procedure Laterality Date  . Hand surgery      LEFT  . Colon surgery  09-08-11    surgery in 6'11  . Dg finger*l*  09-08-11    left index finger-tendon release  . Ventral hernia repair  09/10/2011    Procedure: LAPAROSCOPIC VENTRAL HERNIA;  Surgeon: [name], MD;  Location: [facility];  Service: General;  Laterality: N/A;  Lysis of Adhessions, Converted to open, with Physio Mesh  . Port-a-cath removal  09/10/2011    Procedure: REMOVAL PORT-A-CATH;  Surgeon: [name], MD;  Location: [facility];  Service: General;  Laterality: N/A;  Removal Port-a-Cath    Family History  Problem Relation Age of Onset  . Heart disease Mother     Social History History  Substance Use Topics  . Smoking status: Former Smoker -- 1 years    Quit date: 09/18/1970  . Smokeless tobacco: Never Used  . Alcohol Use: No    Allergies  Allergen Reactions  . Penicillins  Anaphylaxis and Other (See Comments)    Seizures    Current Outpatient Prescriptions  Medication Sig Dispense Refill  . albuterol (PROVENTIL HFA;VENTOLIN HFA) 108 (90 BASE) MCG/ACT inhaler Inhale 2 puffs into the lungs every 6 (six) hours as needed. For shortness of breath       . albuterol (PROVENTIL) (2.5 MG/3ML) 0.083% nebulizer solution Take 2.5 mg by nebulization every 6 (six) hours as needed. For shortness of breath       . budesonide-formoterol (SYMBICORT) 80-4.5 MCG/ACT inhaler Inhale 2 puffs into the lungs 2 (two) times daily.        . carvedilol (COREG) 6.25 MG tablet Take 1 tablet (6.25 mg total) by mouth 2 (two) times daily with a meal.  60 tablet  2  . lisinopril-hydrochlorothiazide (PRINZIDE,ZESTORETIC) 20-25 MG per tablet Take  1 tablet by mouth every morning.        . Multiple Vitamins-Minerals (MULTIVITAMINS THER. W/MINERALS) TABS Take 1 tablet by mouth daily.        Marland Kitchen tiotropium (SPIRIVA) 18 MCG inhalation capsule Place 18 mcg into inhaler and inhale daily.        Marland Kitchen zolpidem (AMBIEN) 10 MG tablet Take 10 mg by mouth at bedtime as needed. For sleep        No current facility-administered medications for this visit.    Review of Systems Review of Systems  Constitutional: Positive for appetite change and unexpected weight change. Negative for fever and chills.  HENT: Negative for hearing loss, congestion, sore throat, trouble swallowing and voice change.   Eyes: Negative for visual disturbance.  Respiratory: Negative for cough and wheezing.   Cardiovascular: Negative for chest pain, palpitations and leg swelling.  Gastrointestinal: Positive for abdominal pain and diarrhea. Negative for nausea, vomiting, constipation, blood in stool, abdominal distention, anal bleeding and rectal pain.  Genitourinary: Negative for hematuria and difficulty urinating.  Musculoskeletal: Negative for arthralgias.  Skin: Negative for rash and wound.  Neurological: Negative for seizures, syncope, weakness and headaches.  Hematological: Negative for adenopathy. Does not bruise/bleed easily.  Psychiatric/Behavioral: Negative for confusion.    Blood pressure 142/80, pulse 76, temperature 97.2 F (36.2 C), temperature source Temporal, resp. rate 16, height 5' 10.5" (1.791 m), weight 213 lb 3.2 oz (96.707 kg).  Physical Exam Physical Exam  Constitutional: He is oriented to person, place, and time. He appears well-developed and well-nourished. No distress.  HENT:  Head: Normocephalic.  Nose: Nose normal.  Mouth/Throat: No oropharyngeal exudate.  Eyes: EOM are normal. Right eye exhibits no discharge. Left eye exhibits no discharge. No scleral icterus.  Neck: Normal range of motion. Neck supple. No JVD present. No tracheal deviation  present. No thyromegaly present.  Cardiovascular: Normal rate, regular rhythm, normal heart sounds and intact distal pulses.   No murmur heard. Pulmonary/Chest: Effort normal and breath sounds normal. No stridor. No respiratory distress. He has no wheezes. He has no rales. He exhibits no tenderness.  Abdominal: Soft. Bowel sounds are normal. He exhibits no distension and no mass. There is no tenderness. There is no rebound and no guarding.  Midline incision well healed. Hernia repair intact. No organomegaly. Not distended. Nontender. No inguinal adenopathy.  Genitourinary:  Rectal exam reveals a slight erythema of the perianal skin, mild dermatitis probably from diarrhea. Digital rectal exam reveals normal sphincter tone. No tenderness. No mass. No blood. No stool present. Doesn't appear impacted.  Musculoskeletal: Normal range of motion. He exhibits no edema and no tenderness.  Lymphadenopathy:    He has  no cervical adenopathy.  Neurological: He is alert and oriented to person, place, and time. He has normal reflexes. Coordination normal.  Skin: Skin is warm and dry. No rash noted. He is not diaphoretic. No erythema. No pallor.  Psychiatric: He has a normal mood and affect. His behavior is normal. Judgment and thought content normal.    Data Reviewed I reviewed lab work and CT scan from February. I requested records from Dr. Jeannetta Nap.  Assessment    Unexpected weight loss and new onset diarrhea and motility disorder. Etiology unclear.  Invasive adenocarcinoma of the right colon, stage TIII, N1 B., stage IIIB. No evidence of recurrence at last restaging in February of this year  Status post adjuvant chemotherapy  Complex ventral incisional hernia, uneventful recovery following open repair with mesh 17 months ago  Asthma  Hypertension  Coronary heart disease       Plan    Fundamentally, but I do not think he has a surgical problem, but rather a motility disorder. He will be  referred back to Dr. Wandalee Ferdinand for GI evaluation to see if he can sort out the cause of this. Hopefully this will be reversible, since it is a relatively recent onset  He is due for colonoscopy  again in October 2015  Return to see me in November 2015, sooner if necessary.        Angelia Mould. Derrell Lolling, M.D., [facility] General and Minimally invasive Surgery Breast and Colorectal Surgery Office:   [phone] Pager:   [phone]  03/03/2013, 11:00 AM

## 2013-03-21 ENCOUNTER — Encounter (INDEPENDENT_AMBULATORY_CARE_PROVIDER_SITE_OTHER): Payer: Self-pay

## 2013-03-21 ENCOUNTER — Telehealth (INDEPENDENT_AMBULATORY_CARE_PROVIDER_SITE_OTHER): Payer: Self-pay

## 2013-03-21 NOTE — Telephone Encounter (Signed)
Please call Mr. Revak and advise him that Dr, Derrell Lolling strongly advises him to reschedule and keep his appt. With Dr. Dulce Sellar.  hmi

## 2013-03-21 NOTE — Telephone Encounter (Signed)
See note below

## 2013-03-21 NOTE — Telephone Encounter (Signed)
I called and spoke to Mrs Clinard.  The pt is unavailable.  She states he is feeling better, and not having diarrhea.  He just can't make the appointment tomorrow.  He will call and reschedule.

## 2013-03-21 NOTE — Telephone Encounter (Signed)
Message copied by Ivory Broad on Mon Mar 21, 2013 11:41 AM ------      Message from: Isaias Sakai K      Created: Mon Mar 21, 2013 10:22 AM      Regarding: Dr Derrell Lolling       Received a call from [facility] stating this patient was referred by Derrell Lolling and he cancelled his appt for tomorrow. He is feeling better. ------

## 2013-10-24 ENCOUNTER — Encounter (INDEPENDENT_AMBULATORY_CARE_PROVIDER_SITE_OTHER): Payer: Self-pay | Admitting: General Surgery

## 2014-02-16 ENCOUNTER — Encounter (HOSPITAL_COMMUNITY): Payer: Self-pay | Admitting: Emergency Medicine

## 2014-02-16 ENCOUNTER — Emergency Department (HOSPITAL_COMMUNITY): Payer: Medicare Other

## 2014-02-16 ENCOUNTER — Emergency Department (HOSPITAL_COMMUNITY)
Admission: EM | Admit: 2014-02-16 | Discharge: 2014-02-16 | Disposition: A | Discharge status: Home / Self Care | Payer: Medicare Other | Attending: Emergency Medicine | Admitting: Emergency Medicine

## 2014-02-16 DIAGNOSIS — Z9089 Acquired absence of other organs: Secondary | ICD-10-CM | POA: Insufficient documentation

## 2014-02-16 DIAGNOSIS — Z85038 Personal history of other malignant neoplasm of large intestine: Secondary | ICD-10-CM | POA: Insufficient documentation

## 2014-02-16 DIAGNOSIS — R112 Nausea with vomiting, unspecified: Secondary | ICD-10-CM

## 2014-02-16 DIAGNOSIS — J449 Chronic obstructive pulmonary disease, unspecified: Secondary | ICD-10-CM | POA: Insufficient documentation

## 2014-02-16 DIAGNOSIS — Z8719 Personal history of other diseases of the digestive system: Secondary | ICD-10-CM | POA: Insufficient documentation

## 2014-02-16 DIAGNOSIS — I10 Essential (primary) hypertension: Secondary | ICD-10-CM | POA: Insufficient documentation

## 2014-02-16 DIAGNOSIS — Z79899 Other long term (current) drug therapy: Secondary | ICD-10-CM | POA: Insufficient documentation

## 2014-02-16 DIAGNOSIS — Z880 Allergy status to penicillin: Secondary | ICD-10-CM | POA: Insufficient documentation

## 2014-02-16 DIAGNOSIS — Z9889 Other specified postprocedural states: Secondary | ICD-10-CM | POA: Insufficient documentation

## 2014-02-16 DIAGNOSIS — K449 Diaphragmatic hernia without obstruction or gangrene: Secondary | ICD-10-CM | POA: Insufficient documentation

## 2014-02-16 DIAGNOSIS — Z87891 Personal history of nicotine dependence: Secondary | ICD-10-CM | POA: Insufficient documentation

## 2014-02-16 DIAGNOSIS — J4489 Other specified chronic obstructive pulmonary disease: Secondary | ICD-10-CM | POA: Insufficient documentation

## 2014-02-16 LAB — COMPREHENSIVE METABOLIC PANEL
ALK PHOS: 78 U/L (ref 39–117)
ALT: 29 U/L (ref 0–53)
AST: 24 U/L (ref 0–37)
Albumin: 4.3 g/dL (ref 3.5–5.2)
BILIRUBIN TOTAL: 1 mg/dL (ref 0.3–1.2)
BUN: 21 mg/dL (ref 6–23)
CHLORIDE: 104 meq/L (ref 96–112)
CO2: 19 meq/L (ref 19–32)
Calcium: 9.6 mg/dL (ref 8.4–10.5)
Creatinine, Ser: 0.81 mg/dL (ref 0.50–1.35)
GFR calc non Af Amer: 89 mL/min — ABNORMAL LOW (ref >90)
GLUCOSE: 164 mg/dL — AB (ref 70–99)
POTASSIUM: 3.6 meq/L — AB (ref 3.7–5.3)
SODIUM: 139 meq/L (ref 137–147)
TOTAL PROTEIN: 7.4 g/dL (ref 6.0–8.3)

## 2014-02-16 LAB — CBC WITH DIFFERENTIAL/PLATELET
Basophils Absolute: 0.1 10*3/uL (ref 0.0–0.1)
Basophils Relative: 1 % (ref 0–1)
EOS ABS: 0.1 10*3/uL (ref 0.0–0.7)
Eosinophils Relative: 1 % (ref 0–5)
HCT: 44.2 % (ref 39.0–52.0)
HEMOGLOBIN: 15.8 g/dL (ref 13.0–17.0)
LYMPHS ABS: 2.3 10*3/uL (ref 0.7–4.0)
LYMPHS PCT: 24 % (ref 12–46)
MCH: 32.9 pg (ref 26.0–34.0)
MCHC: 35.7 g/dL (ref 30.0–36.0)
MCV: 92.1 fL (ref 78.0–100.0)
MONOS PCT: 7 % (ref 3–12)
Monocytes Absolute: 0.6 10*3/uL (ref 0.1–1.0)
NEUTROS ABS: 6.7 10*3/uL (ref 1.7–7.7)
NEUTROS PCT: 67 % (ref 43–77)
PLATELETS: 265 10*3/uL (ref 150–400)
RBC: 4.8 MIL/uL (ref 4.22–5.81)
RDW: 13.1 % (ref 11.5–15.5)
WBC: 9.7 10*3/uL (ref 4.0–10.5)

## 2014-02-16 LAB — LIPASE, BLOOD: LIPASE: 28 U/L (ref 11–59)

## 2014-02-16 LAB — I-STAT TROPONIN, ED: TROPONIN I, POC: 0 ng/mL (ref 0.00–0.08)

## 2014-02-16 LAB — URINALYSIS, ROUTINE W REFLEX MICROSCOPIC
BILIRUBIN URINE: NEGATIVE
Glucose, UA: NEGATIVE mg/dL
HGB URINE DIPSTICK: NEGATIVE
KETONES UR: 40 mg/dL — AB
Leukocytes, UA: NEGATIVE
Nitrite: NEGATIVE
PH: 7.5 (ref 5.0–8.0)
Protein, ur: 30 mg/dL — AB
SPECIFIC GRAVITY, URINE: 1.021 (ref 1.005–1.030)
UROBILINOGEN UA: 0.2 mg/dL (ref 0.0–1.0)

## 2014-02-16 LAB — URINE MICROSCOPIC-ADD ON

## 2014-02-16 LAB — CBG MONITORING, ED: Glucose-Capillary: 141 mg/dL — ABNORMAL HIGH (ref 70–99)

## 2014-02-16 MED ORDER — POLYETHYLENE GLYCOL 3350 17 GM/SCOOP PO POWD: 17.0000 g | Freq: Two times a day (BID) | ORAL | Status: DC

## 2014-02-16 MED ORDER — ONDANSETRON HCL 4 MG/2ML IJ SOLN
4.0000 mg | Freq: Once | INTRAMUSCULAR | Status: AC
  Administered 2014-02-16: 4 mg via INTRAVENOUS
  Filled 2014-02-16: qty 2

## 2014-02-16 MED ORDER — PROMETHAZINE HCL 12.5 MG PO TABS: 12.5000 mg | ORAL_TABLET | Freq: Four times a day (QID) | ORAL | Status: DC | PRN

## 2014-02-16 MED ORDER — IOHEXOL 300 MG/ML  SOLN
100.0000 mL | Freq: Once | INTRAMUSCULAR | Status: AC | PRN
  Administered 2014-02-16: 100 mL via INTRAVENOUS

## 2014-02-16 MED ORDER — SODIUM CHLORIDE 0.9 % IV BOLUS (SEPSIS)
1000.0000 mL | Freq: Once | INTRAVENOUS | Status: AC
  Administered 2014-02-16: 1000 mL via INTRAVENOUS

## 2014-02-16 NOTE — ED Notes (Signed)
Vomiting started this am and some abd pain  gener4alized comes in waves

## 2014-02-16 NOTE — ED Provider Notes (Signed)
Medical screening examination/treatment/procedure(s) were conducted as a shared visit with non-physician practitioner(s) and myself.  I personally evaluated the patient during the encounter.   EKG Interpretation None      I interviewed and examined the patient. Lungs are CTAB. Cardiac exam wnl. Abdomen soft.  Pt here w/ vomiting. Some abd pain, but not currently. Will get CT d/t hx of abd surgeries and sbo.   Blanchard Kelch, MD 02/16/14 2056

## 2014-02-16 NOTE — ED Provider Notes (Signed)
CSN: [id number]     Arrival date & time 02/16/14  1115 History   First MD Initiated Contact with Patient 02/16/14 1141     Chief Complaint  Patient presents with  . Emesis     (Consider location/radiation/quality/duration/timing/severity/associated sxs/prior Treatment) Patient is a 68 y.o. male presenting with vomiting. The history is provided by the patient and medical records.  Emesis  This is a 68 year old male with past medical history significant for hypertension, asthma, COPD, colon cancer, acid reflux, presenting to the ED for nausea or vomiting.  Patient states since awaking this morning he is feeling very nauseated and has had approximately 7 or 8 episodes of nonbloody, nonbilious emesis. He denies any changes in diet or medications. No fevers or chills. Bowel movements have been normal, no diarrhea.  He has been able to pass flatus.  No known sick contacts.  Pt denies any current abdominal pain, back pain, chest pain, or SOB.  Prior abdominal surgeries include partial colectomy, ventral hernia repair.  Per pts wife, he has hx of early SBO following partial colectomy several years ago which resolved without intervention.  Past Medical History  Diagnosis Date  . Hypertension   . Hernia   . Asthma 09-08-11    controlled with inhalers  . Colon cancer 09-08-11    colon ca dx 02/2010. Chemo x 12 sessions  . COPD (chronic obstructive pulmonary disease) 09-08-11    previous smoker  . 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 Incisional hernia s/p open [place] repair w mesh [id number] 09/13/2011  . Complication of anesthesia     ? pneumonia after surgery in 2011  . Hiatal hernia   . Acid reflux    Past Surgical History  Procedure Laterality Date  . Hand surgery      LEFT  . Colon surgery  09-08-11    surgery in 6'11  . Dg finger*l*  09-08-11    left index finger-tendon release  . Ventral hernia repair  09/10/2011    Procedure: LAPAROSCOPIC VENTRAL  HERNIA;  Surgeon: Adin Hector, MD;  Location: [facility];  Service: General;  Laterality: N/A;  Lysis of Adhessions, Converted to open, with Physio Mesh  . Port-a-cath removal  09/10/2011    Procedure: REMOVAL PORT-A-CATH;  Surgeon: Adin Hector, MD;  Location: [facility];  Service: General;  Laterality: N/A;  Removal Port-a-Cath   Family History  Problem Relation Age of Onset  . Heart disease Mother    History  Substance Use Topics  . Smoking status: Former Smoker -- 1 years    Quit date: 09/18/1970  . Smokeless tobacco: Never Used  . Alcohol Use: No    Review of Systems  Gastrointestinal: Positive for nausea and vomiting.  All other systems reviewed and are negative.     Allergies  Penicillins  Home Medications   Prior to Admission medications   Medication Sig Start Date End Date Taking? Authorizing Provider  albuterol (PROVENTIL HFA;VENTOLIN HFA) 108 (90 BASE) MCG/ACT inhaler Inhale 2 puffs into the lungs every 6 (six) hours as needed. For shortness of breath     Historical Provider, MD  albuterol (PROVENTIL) (2.5 MG/3ML) 0.083% nebulizer solution Take 2.5 mg by nebulization every 6 (six) hours as needed. For shortness of breath     Historical Provider, MD  budesonide-formoterol (SYMBICORT) 80-4.5 MCG/ACT inhaler Inhale 2 puffs into the lungs 2 (two) times daily.      Historical Provider,  MD  carvedilol (COREG) 6.25 MG tablet Take 1 tablet (6.25 mg total) by mouth 2 (two) times daily with a meal. 09/16/11 11/22/12  Adin Hector, MD  lisinopril-hydrochlorothiazide (PRINZIDE,ZESTORETIC) 20-25 MG per tablet Take 1 tablet by mouth every morning.      Historical Provider, MD  Multiple Vitamins-Minerals (MULTIVITAMINS THER. W/MINERALS) TABS Take 1 tablet by mouth daily.      Historical Provider, MD  tiotropium (SPIRIVA) 18 MCG inhalation capsule Place 18 mcg into inhaler and inhale daily.      Historical Provider, MD  zolpidem (AMBIEN) 10 MG tablet Take 10 mg by mouth at bedtime  as needed. For sleep     Historical Provider, MD   BP 153/78  Pulse 72  Temp(Src) 97.6 F (36.4 C) (Oral)  Resp 20  SpO2 100%  Physical Exam  Nursing note and vitals reviewed. Constitutional: He is oriented to person, place, and time. He appears well-developed and well-nourished. No distress.  Appears nauseated and mildly pale, lying with cool rag on face  HENT:  Head: Normocephalic and atraumatic.  Mouth/Throat: Uvula is midline and oropharynx is clear and moist. Mucous membranes are dry.  Mildly dry mucous membranes  Eyes: Conjunctivae and EOM are normal. Pupils are equal, round, and reactive to light.  Neck: Normal range of motion. Neck supple.  Cardiovascular: Normal rate, regular rhythm and normal heart sounds.   Pulmonary/Chest: Effort normal and breath sounds normal. No respiratory distress. He has no wheezes.  Abdominal: Soft. Bowel sounds are normal. There is no tenderness. There is no guarding.  Abdomen soft, non-distended, no peritoneal signs  Musculoskeletal: Normal range of motion. He exhibits no edema.  Neurological: He is alert and oriented to person, place, and time.  Skin: Skin is warm and dry. He is not diaphoretic.  Psychiatric: He has a normal mood and affect.    ED Course  Procedures (including critical care time) Labs Review Labs Reviewed  COMPREHENSIVE METABOLIC PANEL - Abnormal; Notable for the following:    Potassium 3.6 (*)    Glucose, Bld 164 (*)    GFR calc non Af Amer 89 (*)    All other components within normal limits  URINALYSIS, ROUTINE W REFLEX MICROSCOPIC - Abnormal; Notable for the following:    Ketones, ur 40 (*)    Protein, ur 30 (*)    All other components within normal limits  URINE MICROSCOPIC-ADD ON - Abnormal; Notable for the following:    Squamous Epithelial / LPF FEW (*)    Bacteria, UA FEW (*)    All other components within normal limits  CBG MONITORING, ED - Abnormal; Notable for the following:    Glucose-Capillary 141 (*)     All other components within normal limits  CBC WITH DIFFERENTIAL  LIPASE, BLOOD  I-STAT TROPOININ, ED    Imaging Review Dg Abd Acute W/chest  02/16/2014   CLINICAL DATA:  Dizziness and vomiting since earlier this morning; history of reactive airway disease, COPD, and colon cancer  EXAM: ACUTE ABDOMEN SERIES (ABDOMEN 2 VIEW & CHEST 1 VIEW)  COMPARISON:  None.  FINDINGS: The lungs are adequately inflated. There is no focal infiltrate. There is no pleural effusion. The cardiopericardial silhouette is normal in size. The pulmonary vascularity is not engorged. The mediastinum is normal in width.  Within the abdomen there is a moderate stool burden throughout the colon. There is a loop of mildly distended gas-filled bowel projecting over the right sacral ala. There is no evidence of a small  or large bowel obstruction. There are phleboliths within the pelvis.  IMPRESSION: 1. There is a moderate stool burden within the colon. There is no evidence of a small or large bowel obstruction. A mild ileus is not excluded. 2. There is no evidence of active cardiopulmonary disease.   Electronically Signed   By: David  Martinique   On: 02/16/2014 13:32     EKG Interpretation None      MDM   Final diagnoses:  Nausea & vomiting   68 year old male presenting to the ED for her persistent nausea and vomiting since waking this morning. Patient does have history of colon cancer with prior hernia repair and partial colectomy with subsequent postop early small bowel obstruction. He denies any current chest pain, shortness of breath, or abdominal pain.  Initial abdominal exam is benign.  Will obtain labs, acute abdominal series.  IVFB, anti-emetics given.  Labs reassuring. Acute abdominal series with moderate stool burden, questionable ileus.  N/V well controlled at this time.  On repeat evaluation when speaking with pts wife, she expresses concern for recurrence of his colon CA as the reason for his sx.  Pt is high risk  for abdominal complications given his medical hx and prior surgeries.  Will plan for CT abd/pelvis with contrast for further eval.  CT pending at this time.  Care signed out to [place] who will follow results and dispo.  If negative, pt may FU with GI and/or PCP, may need to initiate stool-softener to daily medication regimen.  Larene Pickett, PA-C 02/16/14 [phone]

## 2014-02-16 NOTE — ED Provider Notes (Signed)
Pt signed out at shift change by Quincy Carnes, PA-C.  CT pending. Plan to f/u on imaging, If negative, pt may be discharged home with nausea meds and f/u with PCP and [facility] as needed.  Physical Exam  BP 145/88  Pulse 70  Temp(Src) 97.6 F (36.4 C) (Oral)  Resp 18  SpO2 100%  Physical Exam Pt appears well, nontoxic, NAD. Ambulating around room while waiting for CT scan. Heart-RRR. :ungs: CTAB, no respiratory distress. Abd-soft, NDNT. Skin-warm and dry.  ED Course  Procedures  MDM Pt c/o vomiting.  Pt initially examined and treated by Quincy Carnes, PA-C.  Nausea has improved during stay in ED. Labs-unremarkable. CT abd-no evidence of SBO or masses.  There is stool present throughout the colon.  Pt states he feels comfortable being discharged home.  States he just needs to drink some coffee or prune juice to help with constipation. Will start pt on miralax and discharge home with phenergan 12.5mg .  Advised to f/u with PCP and [facility] as needed. Return precautions provided. Pt verbalized understanding and agreement with tx plan.       Noland Fordyce, PA-C 02/16/14 1824

## 2014-02-16 NOTE — ED Notes (Signed)
Pt found standing at doorway with wife. Pt remains dizzy. Assisted back to bed. On monitor.

## 2014-02-16 NOTE — ED Notes (Signed)
Pt placed on cardiac monitor per MD request.

## 2014-02-17 NOTE — ED Provider Notes (Signed)
Medical screening examination/treatment/procedure(s) were performed by non-physician practitioner and as supervising physician I was immediately available for consultation/collaboration.   EKG Interpretation None       Babette Relic, MD 02/17/14 2154

## 2014-07-30 ENCOUNTER — Encounter: Payer: Self-pay | Admitting: *Deleted

## 2015-10-16 ENCOUNTER — Ambulatory Visit: Payer: Self-pay | Admitting: Allergy and Immunology

## 2016-01-09 ENCOUNTER — Ambulatory Visit (INDEPENDENT_AMBULATORY_CARE_PROVIDER_SITE_OTHER): Payer: Medicare Other | Admitting: Allergy and Immunology

## 2016-01-09 ENCOUNTER — Encounter: Payer: Self-pay | Admitting: Allergy and Immunology

## 2016-01-09 VITALS — BP 130/80 | HR 80 | Resp 18

## 2016-01-09 DIAGNOSIS — J441 Chronic obstructive pulmonary disease with (acute) exacerbation: Secondary | ICD-10-CM | POA: Diagnosis not present

## 2016-01-09 DIAGNOSIS — J45901 Unspecified asthma with (acute) exacerbation: Secondary | ICD-10-CM

## 2016-01-09 MED ORDER — UMECLIDINIUM BROMIDE 62.5 MCG/INH IN AEPB: 1.0000 | INHALATION_SPRAY | Freq: Every day | RESPIRATORY_TRACT | Status: DC

## 2016-01-09 MED ORDER — IPRATROPIUM-ALBUTEROL 0.5-2.5 (3) MG/3ML IN SOLN: RESPIRATORY_TRACT | Status: DC

## 2016-01-09 MED ORDER — METHYLPREDNISOLONE ACETATE 80 MG/ML IJ SUSP
80.0000 mg | Freq: Once | INTRAMUSCULAR | Status: AC
  Administered 2016-01-09: 80 mg via INTRAMUSCULAR

## 2016-01-09 MED ORDER — AZITHROMYCIN 500 MG PO TABS: ORAL_TABLET | ORAL | Status: DC

## 2016-01-09 MED ORDER — FLUTICASONE FUROATE-VILANTEROL 200-25 MCG/INH IN AEPB: 1.0000 | INHALATION_SPRAY | Freq: Every day | RESPIRATORY_TRACT | Status: DC

## 2016-01-09 NOTE — Patient Instructions (Signed)
  1. Depo-Medrol 80 IM delivered in clinic today. DuoNeb delivered in clinic today  2. Prednisone 10 mg 2 tablets once a day for 5 days  3. Azithromycin 500 mg one tablet once a day for 3 days. Decrease citalopram dose by 50% while using azithromycin  4. Every day utilize the following medications:   A. Breo 200 - one inhalation 1 time per day  B. Incruze - one inhalation 1 time per day  5. No more Spiriva and Symbicort  6. Can add over-the-counter Mucinex DM and antihistamine  7. Can use DuoNeb nebulization or Ventolin HFA if needed  8. Return to clinic in 6 months or earlier if problem

## 2016-01-09 NOTE — Progress Notes (Signed)
Follow-up Note  Referring Provider: Leonard Downing, * Primary Provider: Leonard Downing, MD Date of Office Visit: 01/09/2016  Subjective:   Christian Harrison (DOB: 29-May-1946) is a 70 y.o. male who returns to the [facility] on 01/09/2016 in re-evaluation of the following:  HPI Comments: Christian Harrison returns to this clinic in evaluation of his COPD with component of asthma. I've not seen him in his clinic since November 2015  He was doing relatively well but unfortunately this past weekend he developed coughing and wheezing and a temperature of 101 along with yellow sputum production and nasal congestion and nose blowing. He is really not much better today and has been using a DuoNeb and Ventolin a few times a day even in the face of utilizing his Symbicort and Spiriva. It sounds as though this is his first exacerbation 01/24/2016.     Medication List           albuterol 108 (90 Base) MCG/ACT inhaler  Commonly known as:  PROVENTIL HFA;VENTOLIN HFA  Inhale 2 puffs into the lungs every 6 (six) hours as needed. For shortness of breath     albuterol (2.5 MG/3ML) 0.083% nebulizer solution  Commonly known as:  PROVENTIL  Take 2.5 mg by nebulization every 6 (six) hours as needed. For shortness of breath     aspirin EC 81 MG tablet  Take 81 mg by mouth daily.     carvedilol 12.5 MG tablet  Commonly known as:  COREG  Take 6.25 mg by mouth daily.     citalopram 40 MG tablet  Commonly known as:  CELEXA  Take 20 mg by mouth daily.     IRON PO  Take by mouth.     multivitamins ther. w/minerals Tabs tablet  Take 1 tablet by mouth daily.     polyethylene glycol powder powder  Commonly known as:  GLYCOLAX/MIRALAX  Take 17 g by mouth 2 (two) times daily. Until daily soft stools  OTC     promethazine 12.5 MG tablet  Commonly known as:  PHENERGAN  Take 1 tablet (12.5 mg total) by mouth every 6 (six) hours as needed for nausea or vomiting.     SYMBICORT 160-4.5  MCG/ACT inhaler  Generic drug:  budesonide-formoterol  Inhale 2 puffs into the lungs 2 (two) times daily.     tiotropium 18 MCG inhalation capsule  Commonly known as:  SPIRIVA  Place 18 mcg into inhaler and inhale daily.     traZODone 150 MG tablet  Commonly known as:  DESYREL  Take 150 mg by mouth at bedtime as needed for sleep.     vitamin A 8000 UNIT capsule  Take 8,000 Units by mouth daily.     VITAMIN B 12 PO  Take by mouth.     VITAMIN C PO  Take by mouth.     VITAMIN E PO  Take by mouth.        Past Medical History  Diagnosis Date  . Hypertension   . Hernia   . Asthma 09-08-11    controlled with inhalers  . Colon cancer ([place]) 09-08-11    colon ca dx 02/2010. Chemo x 12 sessions  . COPD (chronic obstructive pulmonary disease) ([place]) 09-08-11    previous smoker  . 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 Incisional hernia s/p open [place] repair w mesh [id number] 09/13/2011  . Complication of anesthesia     ?  pneumonia after surgery in 2011  . Hiatal hernia   . Acid reflux     Past Surgical History  Procedure Laterality Date  . Hand surgery      LEFT  . Colon surgery  09-08-11    surgery in 6'11  . Dg finger*l*  09-08-11    left index finger-tendon release  . Ventral hernia repair  09/10/2011    Procedure: LAPAROSCOPIC VENTRAL HERNIA;  Surgeon: Adin Hector, MD;  Location: [facility];  Service: General;  Laterality: N/A;  Lysis of Adhessions, Converted to open, with Physio Mesh  . Port-a-cath removal  09/10/2011    Procedure: REMOVAL PORT-A-CATH;  Surgeon: Adin Hector, MD;  Location: [facility];  Service: General;  Laterality: N/A;  Removal Port-a-Cath    Allergies  Allergen Reactions  . Penicillins Anaphylaxis and Other (See Comments)    Seizures    Review of systems negative except as noted in HPI / PMHx or noted below:  Review of Systems  Constitutional: Negative.   HENT: Negative.   Eyes: Negative.     Respiratory: Negative.   Cardiovascular: Negative.   Gastrointestinal: Negative.   Genitourinary: Negative.   Musculoskeletal: Negative.   Skin: Negative.   Neurological: Negative.   Endo/Heme/Allergies: Negative.   Psychiatric/Behavioral: Negative.      Objective:   Filed Vitals:   01/09/16 1127  BP: 130/80  Pulse: 80  Resp: 18          Physical Exam  Constitutional: He is well-developed, well-nourished, and in no distress.  Coughing, tachypnea  HENT:  Head: Normocephalic.  Right Ear: Tympanic membrane, external ear and ear canal normal.  Left Ear: Tympanic membrane, external ear and ear canal normal.  Nose: Nose normal. No mucosal edema or rhinorrhea.  Mouth/Throat: Uvula is midline, oropharynx is clear and moist and mucous membranes are normal. No oropharyngeal exudate.  Eyes: Conjunctivae are normal.  Neck: Trachea normal. No tracheal tenderness present. No tracheal deviation present. No thyromegaly present.  Cardiovascular: Normal rate, regular rhythm, S1 normal, S2 normal and normal heart sounds.   No murmur heard. Pulmonary/Chest: No stridor. No respiratory distress. He has wheezes (Bilateral inspiratory and expiratory wheezes). He has no rales.  Musculoskeletal: He exhibits no edema.  Lymphadenopathy:       Head (right side): No tonsillar adenopathy present.       Head (left side): No tonsillar adenopathy present.    He has no cervical adenopathy.  Neurological: He is alert. Gait normal.  Skin: No rash noted. He is not diaphoretic. No erythema. Nails show no clubbing.  Psychiatric: Mood and affect normal.    Diagnostics:    Spirometry was performed and demonstrated an FEV1 of 1.11 at 33 % of predicted.Following the administration of nebulized albuterol his FEV1 rose to 2.09 which is 61% of predicted and calculated out to an increase in the FEV1 of 89%  The patient had an Asthma Control Test with the following results: ACT Total Score: 16.    Assessment  and Plan:   1. Acute exacerbation of COPD with asthma ([place])     1. Depo-Medrol 80 IM delivered in clinic today. DuoNeb delivered in clinic today  2. Prednisone 10 mg 2 tablets once a day for 5 days  3. Azithromycin 500 mg one tablet once a day for 3 days. Decrease citalopram dose by 50% while using azithromycin  4. Every day utilize the following medications:   A. Breo 200 - one inhalation 1 time per day  B. Incruze - one inhalation 1 time per day  5. No more Spiriva and Symbicort  6. Can add over-the-counter Mucinex DM and antihistamine  7. Can use DuoNeb nebulization or Ventolin HFA if needed  8. Return to clinic in 6 months or earlier if problem  Christian Harrison has had an obvious exacerbation of his COPD and given the fact that he has yellow sputum production we will cover him not just for inflammation but also for infection as noted above. To make things as convenient as possible for him and hopefully to increase compliance we will have him use a once a day delivery method for both his inhaled steroid and long-acting bronchodilator and anticholinergic agent. He will keep in contact with me noting his response to this approach and I will see him back in this clinic in 6 months or earlier if there is a problem.  Allena Katz, MD [place]

## 2016-02-25 ENCOUNTER — Other Ambulatory Visit: Payer: Self-pay | Admitting: Gastroenterology

## 2016-07-09 ENCOUNTER — Ambulatory Visit: Payer: Medicare Other | Admitting: Allergy and Immunology

## 2017-01-30 ENCOUNTER — Telehealth: Payer: Self-pay | Admitting: Allergy and Immunology

## 2017-01-30 MED ORDER — FLUTICASONE FUROATE-VILANTEROL 200-25 MCG/INH IN AEPB: 1.0000 | INHALATION_SPRAY | Freq: Every day | RESPIRATORY_TRACT | 0 refills | Status: DC

## 2017-01-30 NOTE — Telephone Encounter (Signed)
Pt is not supposed to be using Symbicort or Spiriva. Advised pt's wife. Also let her know that I sent in the [facility].

## 2017-01-30 NOTE — Telephone Encounter (Signed)
Pt wife called and said that he needed samples of proventil,symbicort,spiriva. And also need to have breo called into [facility] in [place]. [phone]

## 2017-02-02 ENCOUNTER — Telehealth: Payer: Self-pay | Admitting: Allergy and Immunology

## 2017-02-02 NOTE — Telephone Encounter (Signed)
Spoke to wife advised that he needs ov prior to changing medications since he has not been seen in over 1 yr. Wife states she is going to keep using medications he has on hand. Advised we would see him on 02/17/17 @ 1045. Wife verbalized understanding

## 2017-02-02 NOTE — Telephone Encounter (Signed)
Patient's wife called and said Dr. Neldon Mc prescribed her husband Christian Harrison and she wants to know if their is an alternative for something less expensive. [facility] in [place].

## 2017-02-17 ENCOUNTER — Ambulatory Visit: Payer: Self-pay | Admitting: Allergy and Immunology

## 2017-02-17 DIAGNOSIS — J309 Allergic rhinitis, unspecified: Secondary | ICD-10-CM

## 2017-06-10 ENCOUNTER — Other Ambulatory Visit: Payer: Self-pay | Admitting: Allergy and Immunology

## 2017-06-10 NOTE — Telephone Encounter (Signed)
Patient has not been seen since 01/2016. That is 1 yr 5 mo since lats visit. I left a message denying the refill until he has been seen in the office.

## 2017-06-10 NOTE — Telephone Encounter (Signed)
Patient's wife called and made an appt for Jim on 06-23-17. He needs a refill on his [facility]; he is on his last today. Pharmacy is [facility] in [place].

## 2017-06-23 ENCOUNTER — Ambulatory Visit: Payer: Medicare Other | Admitting: Allergy and Immunology

## 2017-07-01 ENCOUNTER — Ambulatory Visit (INDEPENDENT_AMBULATORY_CARE_PROVIDER_SITE_OTHER): Payer: Medicare Other | Admitting: Allergy and Immunology

## 2017-07-01 ENCOUNTER — Encounter: Payer: Self-pay | Admitting: Allergy and Immunology

## 2017-07-01 VITALS — BP 128/72 | HR 76 | Resp 16

## 2017-07-01 DIAGNOSIS — J449 Chronic obstructive pulmonary disease, unspecified: Secondary | ICD-10-CM

## 2017-07-01 MED ORDER — FLUTICASONE FUROATE-VILANTEROL 200-25 MCG/INH IN AEPB: 1.0000 | INHALATION_SPRAY | Freq: Every day | RESPIRATORY_TRACT | 0 refills | Status: DC

## 2017-07-01 MED ORDER — IPRATROPIUM-ALBUTEROL 0.5-2.5 (3) MG/3ML IN SOLN: RESPIRATORY_TRACT | 1 refills | Status: DC

## 2017-07-01 NOTE — Patient Instructions (Addendum)
  1. Every day utilize the following medications:   A. Breo 200 - one inhalation 1 time per day  2. Can add over-the-counter Mucinex DM and antihistamine if needed  3. Can use DuoNeb nebulization or Ventolin HFA if needed  4. Return to clinic in 6 months or earlier if problem  5. Obtain fall flu vaccine, Prevnar, Pneumovax

## 2017-07-01 NOTE — Progress Notes (Signed)
Follow-up Note  Referring Provider: Leonard Downing, * Primary Provider: Leonard Downing, MD Date of Office Visit: 07/01/2017  Subjective:   Christian Harrison (DOB: [birth year]) is a 71 y.o. male who returns to the [facility] on 07/01/2017 in re-evaluation of the following:  HPI: Elison presents to this clinic in evaluation of his COPD with component of asthma. His last visit to this clinic was April 2017.  He is really been doing well and has not required a systemic steroid or an antibiotic to treat any type of respiratory tract issue. He does not use a bronchodilator greater than 1 time every 2 weeks. However, he is very limited in his ability to perform any type of activity. His mailbox is 100 feet away from his house and he must rest upon returning from that trip. His recovery time is about 5 minutes.  He has found that Memory Dance works the best among all previous used inhalers and he would like to continue on this medication.  He is not sure about his immunization status concerning pneumococcal pneumonia and he has not received the flu vaccine this year.  Allergies as of 07/01/2017      Reactions   Penicillins Anaphylaxis, Other (See Comments)   Seizures      Medication List      albuterol 108 (90 Base) MCG/ACT inhaler Commonly known as:  PROVENTIL HFA;VENTOLIN HFA Inhale 2 puffs into the lungs every 6 (six) hours as needed. For shortness of breath   albuterol (2.5 MG/3ML) 0.083% nebulizer solution Commonly known as:  PROVENTIL Take 2.5 mg by nebulization every 6 (six) hours as needed. For shortness of breath   aspirin EC 81 MG tablet Take 81 mg by mouth daily.   azithromycin 500 MG tablet Commonly known as:  ZITHROMAX TAKE ONE TABLET ONCE DAILY FOR 3 DAYS   carvedilol 12.5 MG tablet Commonly known as:  COREG Take 6.25 mg by mouth daily.   citalopram 40 MG tablet Commonly known as:  CELEXA Take 20 mg by mouth daily.   fluticasone  furoate-vilanterol 200-25 MCG/INH Aepb Commonly known as:  BREO ELLIPTA Inhale 1 puff into the lungs daily.   ipratropium-albuterol 0.5-2.5 (3) MG/3ML Soln Commonly known as:  DUONEB USE ONE VIAL IN THE NEBULIZER EVERY 4-6 HOURS IF NEEDED FOR COUGH OR WHEEZE   IRON PO Take by mouth.   multivitamins ther. w/minerals Tabs tablet Take 1 tablet by mouth daily.   polyethylene glycol powder powder Commonly known as:  GLYCOLAX/MIRALAX Take 17 g by mouth 2 (two) times daily. Until daily soft stools  OTC   promethazine 12.5 MG tablet Commonly known as:  PHENERGAN Take 1 tablet (12.5 mg total) by mouth every 6 (six) hours as needed for nausea or vomiting.   traZODone 150 MG tablet Commonly known as:  DESYREL Take 150 mg by mouth at bedtime as needed for sleep.   umeclidinium bromide 62.5 MCG/INH Aepb Commonly known as:  INCRUSE ELLIPTA Inhale 1 puff into the lungs daily.   vitamin A 8000 UNIT capsule Take 8,000 Units by mouth daily.   VITAMIN B 12 PO Take by mouth.   VITAMIN C PO Take by mouth.   VITAMIN E PO Take by mouth.       Past Medical History:  Diagnosis Date  . Acid reflux   . Asthma 09-08-11   controlled with inhalers  . Colon cancer ([place]) 09-08-11   colon ca dx 02/2010. Chemo x 12 sessions  .  Complication of anesthesia    ? pneumonia after surgery in 2011  . COPD (chronic obstructive pulmonary disease) ([place]) 09-08-11   previous smoker  . Dysrhythmia 03/2010   PAF after colon resection in the setting of partial SBO -  . Hernia   . Hiatal hernia   . Hypertension   . Incisional hernia s/p open [facility] repair w mesh [id number] 09/13/2011    Past Surgical History:  Procedure Laterality Date  . COLON SURGERY  09-08-11   surgery in 6'11  . DG FINGER*L*  09-08-11   left index finger-tendon release  . HAND SURGERY     LEFT  . PORT-A-CATH REMOVAL  09/10/2011   Procedure: REMOVAL PORT-A-CATH;  Surgeon: Adin Hector, MD;  Location: [facility];  Service: General;   Laterality: N/A;  Removal Port-a-Cath  . VENTRAL HERNIA REPAIR  09/10/2011   Procedure: LAPAROSCOPIC VENTRAL HERNIA;  Surgeon: Adin Hector, MD;  Location: [facility];  Service: General;  Laterality: N/A;  Lysis of Adhessions, Converted to open, with Physio Mesh    Review of systems negative except as noted in HPI / PMHx or noted below:  Review of Systems  Constitutional: Negative.   HENT: Negative.   Eyes: Negative.   Respiratory: Negative.   Cardiovascular: Negative.   Gastrointestinal: Negative.   Genitourinary: Negative.   Musculoskeletal: Negative.   Skin: Negative.   Neurological: Negative.   Endo/Heme/Allergies: Negative.   Psychiatric/Behavioral: Negative.      Objective:   Vitals:   07/01/17 1434  BP: 128/72  Pulse: 76  Resp: 16          Physical Exam  Constitutional: He is well-developed, well-nourished, and in no distress.  HENT:  Head: Normocephalic.  Right Ear: Tympanic membrane, external ear and ear canal normal.  Left Ear: Tympanic membrane, external ear and ear canal normal.  Nose: Nose normal. No mucosal edema or rhinorrhea.  Mouth/Throat: Uvula is midline, oropharynx is clear and moist and mucous membranes are normal. No oropharyngeal exudate.  Eyes: Conjunctivae are normal.  Neck: Trachea normal. No tracheal tenderness present. No tracheal deviation present. No thyromegaly present.  Cardiovascular: Normal rate, regular rhythm, S1 normal, S2 normal and normal heart sounds.   No murmur heard. Pulmonary/Chest: Breath sounds normal. No stridor. No respiratory distress. He has no wheezes. He has no rales.  Musculoskeletal: He exhibits no edema.  Lymphadenopathy:       Head (right side): No tonsillar adenopathy present.       Head (left side): No tonsillar adenopathy present.    He has no cervical adenopathy.  Neurological: He is alert. Gait normal.  Skin: No rash noted. He is not diaphoretic. No erythema. Nails show no clubbing.  Psychiatric: Mood  and affect normal.    Diagnostics:    Spirometry was performed and demonstrated an FEV1 of 1.28 at 38 % of predicted.  The patient had an Asthma Control Test with the following results: ACT Total Score: 15.    Assessment and Plan:   1. COPD with asthma ([place])     1. Every day utilize the following medications:   A. Breo 200 - one inhalation 1 time per day  2. Can add over-the-counter Mucinex DM and antihistamine if needed  3. Can use DuoNeb nebulization or Ventolin HFA if needed  4. Return to clinic in 6 months or earlier if problem  5. Obtain fall flu vaccine, Prevnar, Pneumovax  Massimo appears to be doing relatively well and he will continue on Breo  and I made a recommendation that he get his  Immunization status updated and I will see him back in this clinic in 6 months or earlier if there is a problem.  Allena Katz, MD Allergy / Immunology [place]

## 2017-08-11 ENCOUNTER — Other Ambulatory Visit: Payer: Self-pay | Admitting: Family Medicine

## 2017-08-11 ENCOUNTER — Ambulatory Visit
Admission: RE | Admit: 2017-08-11 | Discharge: 2017-08-11 | Disposition: A | Discharge status: Home / Self Care | Payer: Medicare Other | Source: Ambulatory Visit | Attending: Family Medicine | Admitting: Family Medicine

## 2017-08-11 DIAGNOSIS — R531 Weakness: Secondary | ICD-10-CM

## 2018-04-13 ENCOUNTER — Ambulatory Visit: Payer: Medicare Other | Admitting: Allergy and Immunology

## 2018-04-13 VITALS — BP 122/72 | HR 86 | Resp 18

## 2018-04-13 DIAGNOSIS — J449 Chronic obstructive pulmonary disease, unspecified: Secondary | ICD-10-CM | POA: Diagnosis not present

## 2018-04-13 MED ORDER — IPRATROPIUM-ALBUTEROL 0.5-2.5 (3) MG/3ML IN SOLN: RESPIRATORY_TRACT | 1 refills | Status: DC

## 2018-04-13 MED ORDER — FLUTICASONE FUROATE-VILANTEROL 200-25 MCG/INH IN AEPB: 1.0000 | INHALATION_SPRAY | Freq: Every day | RESPIRATORY_TRACT | 5 refills | Status: DC

## 2018-04-13 NOTE — Patient Instructions (Addendum)
  1. Every day utilize the following medications:   A. Breo 200 - one inhalation 1 time per day  2. Can add over-the-counter Mucinex DM and antihistamine if needed  3. Can use DuoNeb nebulization or Ventolin HFA if needed  4. Return to clinic in 6 months or earlier if problem  5.  Obtain fall flu vaccine

## 2018-04-13 NOTE — Progress Notes (Signed)
Follow-up Note  Referring Provider: Leonard Harrison, * Primary Provider: Leonard Downing, MD Date of Office Visit: 04/13/2018  Subjective:   Christian Harrison (DOB: 04-Aug-1946) is a 72 y.o. male who returns to the [facility] [place] on 04/13/2018 in re-evaluation of the following:  HPI: Christian Harrison returns to this clinic in evaluation of his COPD with component of asthma.  His last visit to this clinic was 01 July 2017.  He is still very limited in his ability to exercise and he does have wheezing and coughing daily and must use a bronchodilator averaging out to maybe twice a day with the use of DuoNeb even though he continues to use Breo on a regular basis.  Not much has really change in his status over the course of the past year or so.  When he does use nebulized bronchodilator he does show improvement regarding his symptoms.  Allergies as of 04/13/2018      Reactions   Penicillins Anaphylaxis, Other (See Comments)   Seizures      Medication List      albuterol (2.5 MG/3ML) 0.083% nebulizer solution Commonly known as:  PROVENTIL Take 2.5 mg by nebulization every 6 (six) hours as needed. For shortness of breath   aspirin EC 81 MG tablet Take 81 mg by mouth daily.   carvedilol 12.5 MG tablet Commonly known as:  COREG Take 6.25 mg by mouth daily.   citalopram 40 MG tablet Commonly known as:  CELEXA Take 20 mg by mouth daily.   ipratropium-albuterol 0.5-2.5 (3) MG/3ML Soln Commonly known as:  DUONEB USE ONE VIAL IN THE NEBULIZER EVERY 4-6 HOURS IF NEEDED FOR COUGH OR WHEEZE   lovastatin 20 MG tablet Commonly known as:  MEVACOR   multivitamins ther. w/minerals Tabs tablet Take 1 tablet by mouth daily.   traZODone 150 MG tablet Commonly known as:  DESYREL Take 150 mg by mouth at bedtime as needed for sleep.   vitamin A 8000 UNIT capsule Take 8,000 Units by mouth daily.   VITAMIN B 12 PO Take by mouth.   VITAMIN C PO Take by mouth.   VITAMIN  E PO Take by mouth.       Past Medical History:  Diagnosis Date  . Acid reflux   . Asthma 09-08-11   controlled with inhalers  . Colon cancer ([place]) 09-08-11   colon ca dx 02/2010. Chemo x 12 sessions  . Complication of anesthesia    ? pneumonia after surgery in 2011  . COPD (chronic obstructive pulmonary disease) ([place]) 09-08-11   previous smoker  . Dysrhythmia 03/2010   PAF after colon resection in the setting of partial SBO -  . Hernia   . Hiatal hernia   . Hypertension   . Incisional hernia s/p open [facility] repair w mesh [id number] 09/13/2011    Past Surgical History:  Procedure Laterality Date  . COLON SURGERY  09-08-11   surgery in 6'11  . DG FINGER*L*  09-08-11   left index finger-tendon release  . HAND SURGERY     LEFT  . PORT-A-CATH REMOVAL  09/10/2011   Procedure: REMOVAL PORT-A-CATH;  Surgeon: Adin Hector, MD;  Location: [facility];  Service: General;  Laterality: N/A;  Removal Port-a-Cath  . VENTRAL HERNIA REPAIR  09/10/2011   Procedure: LAPAROSCOPIC VENTRAL HERNIA;  Surgeon: Adin Hector, MD;  Location: [facility];  Service: General;  Laterality: N/A;  Lysis of Adhessions, Converted to open, with Physio Mesh  Review of systems negative except as noted in HPI / PMHx or noted below:  Review of Systems  Constitutional: Negative.   HENT: Negative.   Eyes: Negative.   Respiratory: Negative.   Cardiovascular: Negative.   Gastrointestinal: Negative.   Genitourinary: Negative.   Musculoskeletal: Negative.   Skin: Negative.   Neurological: Negative.   Endo/Heme/Allergies: Negative.   Psychiatric/Behavioral: Negative.      Objective:   Vitals:   04/13/18 1742  BP: 122/72  Pulse: 86  Resp: 18  SpO2: 98%          Physical Exam  HENT:  Head: Normocephalic.  Right Ear: Tympanic membrane, external ear and ear canal normal.  Left Ear: Tympanic membrane, external ear and ear canal normal.  Nose: Nose normal. No mucosal edema or rhinorrhea.  Mouth/Throat: Uvula  is midline, oropharynx is clear and moist and mucous membranes are normal. No oropharyngeal exudate.  Eyes: Conjunctivae are normal.  Neck: Trachea normal. No tracheal tenderness present. No tracheal deviation present. No thyromegaly present.  Cardiovascular: Normal rate, regular rhythm, S1 normal, S2 normal and normal heart sounds.  No murmur heard. Pulmonary/Chest: Breath sounds normal. No stridor. No respiratory distress. He has no wheezes. He has no rales.  Musculoskeletal: He exhibits no edema.  Lymphadenopathy:       Head (right side): No tonsillar adenopathy present.       Head (left side): No tonsillar adenopathy present.    He has no cervical adenopathy.  Neurological: He is alert.  Skin: No rash noted. He is not diaphoretic. No erythema. Nails show no clubbing.    Diagnostics:    Spirometry was performed and demonstrated an FEV1 of 2.05 at 62 % of predicted.  The patient had an Asthma Control Test with the following results: ACT Total Score: 15.    Assessment and Plan:   1. COPD with asthma ([place])     1. Every day utilize the following medications:   A. Breo 200 - one inhalation 1 time per day  2. Can add over-the-counter Mucinex DM and antihistamine if needed  3. Can use DuoNeb nebulization or Ventolin HFA if needed  4. Return to clinic in 6 months or earlier if problem  5.  Obtain fall flu vaccine  I am not sure that we can do much more with Christian Harrison at this point in time and I think that he has received the maximal benefit he is going to receive from his current plan noted above.  I made the recommendation that he consider reevaluation with a pulmonologist if he feels as though he is not doing any better as he moves forward.  If he does okay I will see him back in this clinic in 6 months or earlier if there is a problem.  Allena Katz, MD Allergy / Immunology [place]

## 2018-04-14 ENCOUNTER — Encounter: Payer: Self-pay | Admitting: Allergy and Immunology

## 2018-07-19 ENCOUNTER — Telehealth: Payer: Self-pay | Admitting: Allergy and Immunology

## 2018-07-19 NOTE — Telephone Encounter (Signed)
Pt wife called and said that he needed to get some samples of symbicort. [phone]

## 2018-07-19 NOTE — Telephone Encounter (Signed)
Patient's wife [name] called back, relayed information to [employer]. They will be coming in to pick up samples.

## 2018-07-19 NOTE — Telephone Encounter (Signed)
Called and left voicemail at [phone] asking to return call to discuss medication. After reviewing the last office note, the patient does not take Symbicort they take Breo 226mcg. We do have samples in office. 2 samples of Breo have been signed out and placed up front for patient. Will need to inform the patient when they call back.

## 2018-08-26 ENCOUNTER — Telehealth: Payer: Self-pay | Admitting: Allergy and Immunology

## 2018-08-26 MED ORDER — IPRATROPIUM-ALBUTEROL 0.5-2.5 (3) MG/3ML IN SOLN: RESPIRATORY_TRACT | 1 refills | Status: DC

## 2018-08-26 NOTE — Telephone Encounter (Signed)
Pt wife called to see if we have any samples of Duoneb . But if we do not please sent to [facility]. [phone]

## 2018-08-26 NOTE — Telephone Encounter (Signed)
Refill has been sent.  °

## 2018-10-12 ENCOUNTER — Ambulatory Visit (INDEPENDENT_AMBULATORY_CARE_PROVIDER_SITE_OTHER): Payer: Medicare Other | Admitting: Cardiovascular Disease

## 2018-10-12 ENCOUNTER — Encounter (INDEPENDENT_AMBULATORY_CARE_PROVIDER_SITE_OTHER): Payer: Self-pay

## 2018-10-12 ENCOUNTER — Telehealth: Payer: Self-pay | Admitting: Pharmacist Clinician (PhC)/ Clinical Pharmacy Specialist

## 2018-10-12 ENCOUNTER — Encounter: Payer: Self-pay | Admitting: Cardiovascular Disease

## 2018-10-12 VITALS — BP 138/84 | HR 87 | Ht 71.0 in | Wt 203.0 lb

## 2018-10-12 DIAGNOSIS — E785 Hyperlipidemia, unspecified: Secondary | ICD-10-CM | POA: Insufficient documentation

## 2018-10-12 DIAGNOSIS — Z79899 Other long term (current) drug therapy: Secondary | ICD-10-CM

## 2018-10-12 DIAGNOSIS — I4891 Unspecified atrial fibrillation: Secondary | ICD-10-CM | POA: Diagnosis not present

## 2018-10-12 DIAGNOSIS — I10 Essential (primary) hypertension: Secondary | ICD-10-CM

## 2018-10-12 DIAGNOSIS — E782 Mixed hyperlipidemia: Secondary | ICD-10-CM | POA: Diagnosis not present

## 2018-10-12 LAB — CBC
Hematocrit: 47.4 % (ref 37.5–51.0)
Hemoglobin: 16.9 g/dL (ref 13.0–17.7)
MCH: 31.9 pg (ref 26.6–33.0)
MCHC: 35.7 g/dL (ref 31.5–35.7)
MCV: 90 fL (ref 79–97)
PLATELETS: 242 10*3/uL (ref 150–450)
RBC: 5.29 x10E6/uL (ref 4.14–5.80)
RDW: 12.7 % (ref 11.6–15.4)
WBC: 17.3 10*3/uL — ABNORMAL HIGH (ref 3.4–10.8)

## 2018-10-12 LAB — BASIC METABOLIC PANEL
BUN/Creatinine Ratio: 31 — ABNORMAL HIGH (ref 10–24)
BUN: 27 mg/dL (ref 8–27)
CHLORIDE: 101 mmol/L (ref 96–106)
CO2: 20 mmol/L (ref 20–29)
Calcium: 9.7 mg/dL (ref 8.6–10.2)
Creatinine, Ser: 0.87 mg/dL (ref 0.76–1.27)
GFR calc Af Amer: 99 mL/min/{1.73_m2} (ref >59)
GFR calc non Af Amer: 86 mL/min/{1.73_m2} (ref >59)
GLUCOSE: 110 mg/dL — AB (ref 65–99)
POTASSIUM: 4.2 mmol/L (ref 3.5–5.2)
SODIUM: 138 mmol/L (ref 134–144)

## 2018-10-12 MED ORDER — RIVAROXABAN 20 MG PO TABS: 20.0000 mg | ORAL_TABLET | Freq: Every day | ORAL | 3 refills | Status: DC

## 2018-10-12 NOTE — Assessment & Plan Note (Signed)
History of hyperlipidemia on statin therapy followed by his PCP 

## 2018-10-12 NOTE — Assessment & Plan Note (Signed)
History of chronic A. Fib recently found on evaluation by his PCP.  He is unaware that he is in A. Fib. This patients CHA2DS2-VASc Score and unadjusted Ischemic Stroke Rate (% per year) is equal to 2.2 % stroke rate/year from a score of 2.  I am going to begin him on oral anticoagulation for stroke prophylaxis.  Above score calculated as 1 point each if present [CHF, HTN, DM, Vascular=MI/PAD/Aortic Plaque, Age if 65-74, or Male] Above score calculated as 2 points each if present [Age > 75, or Stroke/TIA/TE]

## 2018-10-12 NOTE — Telephone Encounter (Signed)
Patient in to see Dr. Gwenlyn Found today, newly diagnosed AF.  CHADS2-VASc score of 2 (age, hyertension).    Pt was started on Xarelto for atrial fibrillation on Oct 12, 2018.    Reviewed patients medication list.  Pt is not currently on any combined P-gp and strong CYP3A4 inhibitors/inducers (ketoconazole, traconazole, ritonavir, carbamazepine, phenytoin, rifampin, St. John's wort).  Patient has not had labs in Epic or [facility] in greater than 1 year.  Will have labs (BMET, CBC) drawn today.   Patient was given samples of Xarelto 20 mg x 2 weeks, we will confirm correct dose once lab results come in later today.    A full discussion of the nature of anticoagulants has been carried out.  A benefit/risk analysis has been presented to the patient, so that they understand the justification for choosing anticoagulation with Xarelto at this time.  The need for compliance is stressed.  Pt is aware to take the medication once daily with the largest meal of the day.  Side effects of potential bleeding are discussed, including unusual colored urine or stools, coughing up blood or coffee ground emesis, nose bleeds or serious fall or head trauma.  Discussed signs and symptoms of stroke. The patient should avoid any OTC items containing aspirin or ibuprofen.  Avoid alcohol consumption.   Call if any signs of abnormal bleeding.  Discussed financial obligations and resolved any difficulty in obtaining medication.  Marland Kitchen

## 2018-10-12 NOTE — Assessment & Plan Note (Signed)
History of essential hypertension her blood pressure measured today 138/84.  He is on carvedilol.  Continue current meds at current dosing.

## 2018-10-12 NOTE — Progress Notes (Signed)
10/12/2018 Christian Harrison   October 09, 1945  [id number]  Primary Physician Christian Downing, MD Primary Cardiologist: Lorretta Harp MD Christian Harrison, [place]  HPI:  Christian Harrison is a 73 y.o. thin appearing married Caucasian male father of 2 stepchildren, grandfather of 86 stepgrandchildren referred by Dr. Claris Harrison for cardiovascular dilation because of newly recognized atrial fibrillation.  He is accompanied by his wife Christian Harrison today.  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  He was seen by Dr. Golden Harrison in consultation 09/13/2011 in the setting of A. fib with RVR during a hospitalization for hernia repair.  He is risk factors include treated hypertension and hyperlipidemia.  He is never had a heart attack or stroke.  He denies chest pain or shortness of breath.  There is no family history for heart disease.  He was recently seen by his PCP who noted that he was in A. fib rate controlled.   Current Meds  Medication Sig  . Ascorbic Acid (VITAMIN C PO) Take by mouth.  Marland Kitchen aspirin EC 81 MG tablet Take 81 mg by mouth daily.  . carvedilol (COREG) 12.5 MG tablet Take 6.25 mg by mouth daily.  . citalopram (CELEXA) 40 MG tablet Take 20 mg by mouth daily.  . Cyanocobalamin (VITAMIN B 12 PO) Take by mouth.  . fluticasone furoate-vilanterol (BREO ELLIPTA) 200-25 MCG/INH AEPB Inhale 1 puff into the lungs daily.  Marland Kitchen ipratropium-albuterol (DUONEB) 0.5-2.5 (3) MG/3ML SOLN USE ONE VIAL IN THE NEBULIZER EVERY 4-6 HOURS IF NEEDED FOR COUGH OR WHEEZE  . lovastatin (MEVACOR) 20 MG tablet   . Multiple Vitamins-Minerals (MULTIVITAMINS THER. W/MINERALS) TABS Take 1 tablet by mouth daily.    . traZODone (DESYREL) 150 MG tablet Take 150 mg by mouth at bedtime as needed for sleep.  . vitamin A 8000 UNIT capsule Take 8,000 Units by mouth daily.  Marland Kitchen VITAMIN E PO Take by mouth.     Allergies  Allergen Reactions  . Penicillins Anaphylaxis and Other (See Comments)    Seizures    Social  History   Socioeconomic History  . Marital status: Married    Spouse name: Not on file  . Number of children: Not on file  . Years of education: Not on file  . Highest education level: Not on file  Occupational History  . Not on file  Social Needs  . Financial resource strain: Not on file  . Food insecurity:    Worry: Not on file    Inability: Not on file  . Transportation needs:    Medical: Not on file    Non-medical: Not on file  Tobacco Use  . Smoking status: Former Smoker    Years: 1.00    Last attempt to quit: 09/18/1970    Years since quitting: 48.0  . Smokeless tobacco: Never Used  Substance and Sexual Activity  . Alcohol use: No  . Drug use: No  . Sexual activity: Yes    Birth control/protection: None  Lifestyle  . Physical activity:    Days per week: Not on file    Minutes per session: Not on file  . Stress: Not on file  Relationships  . Social connections:    Talks on phone: Not on file    Gets together: Not on file    Attends religious service: Not on file    Active member of club or organization: Not on file    Attends meetings of  clubs or organizations: Not on file    Relationship status: Not on file  . Intimate partner violence:    Fear of current or ex partner: Not on file    Emotionally abused: Not on file    Physically abused: Not on file    Forced sexual activity: Not on file  Other Topics Concern  . Not on file  Social History Narrative  . Not on file     Review of Systems: General: negative for chills, fever, night sweats or weight changes.  Cardiovascular: negative for chest pain, dyspnea on exertion, edema, orthopnea, palpitations, paroxysmal nocturnal dyspnea or shortness of breath Dermatological: negative for rash Respiratory: negative for cough or wheezing Urologic: negative for hematuria Abdominal: negative for nausea, vomiting, diarrhea, bright red blood per rectum, melena, or hematemesis Neurologic: negative for visual changes,  syncope, or dizziness All other systems reviewed and are otherwise negative except as noted above.    Blood pressure 138/84, pulse 87, height 5\' 11"  (1.803 m), weight 203 lb (92.1 kg).  General appearance: alert and no distress Neck: no adenopathy, no carotid bruit, no JVD, supple, symmetrical, trachea midline and thyroid not enlarged, symmetric, no tenderness/mass/nodules Lungs: clear to auscultation bilaterally Heart: irregularly irregular rhythm Extremities: extremities normal, atraumatic, no cyanosis or edema Pulses: 2+ and symmetric Skin: Skin color, texture, turgor normal. No rashes or lesions Neurologic: Alert and oriented X 3, normal strength and tone. Normal symmetric reflexes. Normal coordination and gait  EKG atrial fibrillation with a ventricular response of 87 and nonspecific ST and T wave changes.  I personally reviewed this EKG.  ASSESSMENT AND PLAN:   Hypertension History of essential hypertension her blood pressure measured today 138/84.  He is on carvedilol.  Continue current meds at current dosing.  Atrial fibrillation with RVR ([facility]) History of chronic A. Fib recently found on evaluation by his PCP.  He is unaware that he is in A. Fib. This patients CHA2DS2-VASc Score and unadjusted Ischemic Stroke Rate (% per year) is equal to 2.2 % stroke rate/year from a score of 2.  I am going to begin him on oral anticoagulation for stroke prophylaxis.  Above score calculated as 1 point each if present [CHF, HTN, DM, Vascular=MI/PAD/Aortic Plaque, Age if 65-74, or Male] Above score calculated as 2 points each if present [Age > 75, or Stroke/TIA/TE]   Hyperlipidemia History of hyperlipidemia on statin therapy followed by his PCP.      Lorretta Harp MD FACP,FACC,FAHA, [facility] 10/12/2018 10:56 AM

## 2018-10-12 NOTE — Patient Instructions (Signed)
Medication Instructions:  Your physician has recommended you make the following change in your medication:  START XARELTO 20 MG BY MOUTH DAILY If you need a refill on your cardiac medications before your next appointment, please call your pharmacy.   Lab work: Your physician recommends that you return for lab work TODAY: CBC, BMET  If you have labs (blood work) drawn today and your tests are completely normal, you will receive your results only by: Marland Kitchen MyChart Message (if you have MyChart) OR . A paper copy in the mail If you have any lab test that is abnormal or we need to change your treatment, we will call you to review the results.  Testing/Procedures: Your physician has requested that you have an echocardiogram. Echocardiography is a painless test that uses sound waves to create images of your heart. It provides your doctor with information about the size and shape of your heart and how well your heart's chambers and valves are working. This procedure takes approximately one hour. There are no restrictions for this procedure.   Follow-Up: At [facility], you and your health needs are our priority.  As part of our continuing mission to provide you with exceptional heart care, we have created designated Provider Care Teams.  These Care Teams include your primary Cardiologist (physician) and Advanced Practice Providers (APPs -  Physician Assistants and Nurse Practitioners) who all work together to provide you with the care you need, when you need it. . You will need a follow up appointment in 12 months.  Please call our office 2 months in advance to schedule this appointment.  You may see Dr. Gwenlyn Found or one of the following Advanced Practice Providers on your designated Care Team:   . Kerin Ransom, [place] . Almyra Deforest, PA-C . Fabian Sharp, PA-C . Jory Sims, DNP . Rosaria Ferries, PA-C . Roby Lofts, PA-C . Sande Rives, PA-C

## 2018-10-13 ENCOUNTER — Telehealth: Payer: Self-pay | Admitting: Cardiovascular Disease

## 2018-10-13 ENCOUNTER — Encounter: Payer: Self-pay | Admitting: *Deleted

## 2018-10-13 NOTE — Telephone Encounter (Signed)
Returned call to patient. Raquel's advice given.Advised to call back if symptoms continue.

## 2018-10-13 NOTE — Telephone Encounter (Signed)
Taking Xarelto with food (supper) should decrease any stomach discomfort.   Call back if problems persist after few days.  Nausea, vomiting, fever or diarrhea may be related to viral infection and not to  medication.

## 2018-10-13 NOTE — Telephone Encounter (Signed)
Returned call to patient's wife.She stated husband started on Xarelto 20 mg last night.Stated he feels light headed,weakness and stomach feels bloated.Advised Xarelto should not have caused these symptoms.Does not feel heart beating fast. Advised I will send message to our pharmacist for advice.

## 2018-10-13 NOTE — Telephone Encounter (Signed)
New Message   Pt c/o medication issue:  1. Name of Medication: rivaroxaban (XARELTO) 20 MG TABS tablet  2. How are you currently taking this medication (dosage and times per day)? Take 1 tablet (20 mg total) by mouth daily with supper.  3. Are you having a reaction (difficulty breathing--STAT)? Yes  4. What is your medication issue? Pt's wife is calling states the pt has been experiencing some bloating and lightheadedness. Please call

## 2018-10-20 ENCOUNTER — Other Ambulatory Visit (HOSPITAL_COMMUNITY): Payer: Medicare Other

## 2018-10-28 ENCOUNTER — Other Ambulatory Visit (HOSPITAL_COMMUNITY): Payer: Medicare Other

## 2018-11-02 ENCOUNTER — Other Ambulatory Visit (HOSPITAL_COMMUNITY): Payer: Medicare Other

## 2018-11-03 ENCOUNTER — Ambulatory Visit (HOSPITAL_COMMUNITY): Payer: Medicare Other | Attending: Cardiology

## 2018-11-03 DIAGNOSIS — I4891 Unspecified atrial fibrillation: Secondary | ICD-10-CM | POA: Diagnosis present

## 2018-11-04 ENCOUNTER — Encounter: Payer: Self-pay | Admitting: Cardiovascular Disease

## 2018-11-04 NOTE — Telephone Encounter (Signed)
This encounter was created in error - please disregard.

## 2018-11-04 NOTE — Telephone Encounter (Signed)
Please call with echo results 

## 2018-11-05 ENCOUNTER — Telehealth: Payer: Self-pay | Admitting: Cardiovascular Disease

## 2018-11-05 ENCOUNTER — Encounter: Payer: Self-pay | Admitting: Cardiovascular Disease

## 2018-11-05 NOTE — Telephone Encounter (Signed)
This encounter was created in error - please disregard.

## 2018-11-05 NOTE — Telephone Encounter (Signed)
Left message for pt to call.

## 2018-11-05 NOTE — Telephone Encounter (Signed)
pt aware of results  Follow up scheduled  

## 2018-11-05 NOTE — Telephone Encounter (Signed)
New message:    Patient stating some one call him about some results. Please call patient back.

## 2018-11-05 NOTE — Telephone Encounter (Signed)
New Message   Patient wants echo results.

## 2018-11-12 ENCOUNTER — Other Ambulatory Visit: Payer: Self-pay | Admitting: Cardiovascular Disease

## 2018-11-12 MED ORDER — RIVAROXABAN 20 MG PO TABS: 20.0000 mg | ORAL_TABLET | Freq: Every day | ORAL | 3 refills | Status: DC

## 2018-11-12 NOTE — Telephone Encounter (Signed)
° ° ° °*  STAT* If patient is at the pharmacy, call can be transferred to refill team.   1. Which medications need to be refilled? (please list name of each medication and dose if known) rivaroxaban (XARELTO) 20 MG TABS tablet  2. Which pharmacy/location (including street and city if local pharmacy) is medication to be sent to? [facility] ([place]), [place] [address]  3. Do they need a 30 day or 90 day supply? [place]

## 2018-11-12 NOTE — Telephone Encounter (Signed)
Pt is a 72yom with a wt of 92.1kg, and a Scr: 0.87

## 2018-11-16 ENCOUNTER — Telehealth: Payer: Self-pay | Admitting: Cardiovascular Disease

## 2018-11-16 MED ORDER — RIVAROXABAN 20 MG PO TABS: 20.0000 mg | ORAL_TABLET | Freq: Every day | ORAL | 1 refills | Status: DC

## 2018-11-16 NOTE — Telephone Encounter (Signed)
Sent to [facility] as requested   Spoke with wife and patient is taking ASA 81 mg daily. Will forward to Dr Gwenlyn Found to see if patient to continue or d/c

## 2018-11-16 NOTE — Telephone Encounter (Signed)
New Message         *STAT* If patient is at the pharmacy, call can be transferred to refill team.   1. Which medications need to be refilled? (please list name of each medication and dose if known) Xarelto   2. Which pharmacy/location (including street and city if local pharmacy) is medication to be sent to? [facility] [place]   3. Do they need a 30 day or 90 day supply? [place]

## 2018-11-17 NOTE — Telephone Encounter (Signed)
Okay to stop aspirin

## 2018-11-17 NOTE — Telephone Encounter (Signed)
Spoke with pt wife, aware of dr berry's recommendations.   

## 2018-11-24 ENCOUNTER — Encounter: Payer: Self-pay | Admitting: Cardiovascular Disease

## 2018-11-24 ENCOUNTER — Encounter (INDEPENDENT_AMBULATORY_CARE_PROVIDER_SITE_OTHER): Payer: Self-pay

## 2018-11-24 ENCOUNTER — Ambulatory Visit: Payer: Medicare Other | Admitting: Cardiovascular Disease

## 2018-11-24 DIAGNOSIS — I4891 Unspecified atrial fibrillation: Secondary | ICD-10-CM

## 2018-11-24 NOTE — Addendum Note (Signed)
Addended by: Annita Brod on: 11/24/2018 05:06 PM   Modules accepted: Orders

## 2018-11-24 NOTE — Patient Instructions (Signed)
Dear Christian Harrison,  You are scheduled for a TEE/Cardioversion/TEE Cardioversion on Friday, December 03, 2018 with Dr. Acie Fredrickson. Please arrive at the [facility] (Main Entrance A) at [facility]: [address] [place], [place] [address] at 9:15 am. (1 hour prior to procedure unless lab work is needed; if lab work is needed arrive 1.5 hours ahead)  DIET: Nothing to eat or drink after midnight except a sip of water with medications (see medication instructions below)  Medication Instructions: Hold carvedilol on the day of test.  Continue your anticoagulant: Xarelto You will need to continue your anticoagulant after your procedure until you  are told by your  Provider that it is safe to stop   Labs: If patient is on Coumadin, patient needs pt/INR, CBC, BMET within 3 days (No pt/INR needed for patients taking Xarelto, Eliquis, Pradaxa) For patients receiving anesthesia for TEE and all Cardioversion patients: BMET, CBC within 1 week   You must have a responsible person to drive you home and stay in the waiting area during your procedure. Failure to do so could result in cancellation.  Bring your insurance cards.  *Special Note: Every effort is made to have your procedure done on time. Occasionally there are emergencies that occur at the hospital that may cause delays. Please be patient if a delay does occur.   Lab work: Your physician recommends that you return for lab work this week: CBC, BMP If you have labs (blood work) drawn today and your tests are completely normal, you will receive your results only by: Marland Kitchen MyChart Message (if you have MyChart) OR . A paper copy in the mail If you have any lab test that is abnormal or we need to change your treatment, we will call you to review the results.  Follow-Up: At [facility], you and your health needs are our priority.  As part of our continuing mission to provide you with exceptional heart care, we have created designated Provider  Care Teams.  These Care Teams include your primary Cardiologist (physician) and Advanced Practice Providers (APPs -  Physician Assistants and Nurse Practitioners) who all work together to provide you with the care you need, when you need it. You will need a follow up appointment in 3 months with Dr. Gwenlyn Found.  Please call our office 2 months in advance to schedule this appointment.

## 2018-11-24 NOTE — H&P (View-Only) (Signed)
[name] returns today for follow-up.  He was seen by me 10/12/2018.  He was referred for newly recognized A. fib with RVR.  Currently rate controlled on Xarelto which she is taken uninterrupted for the last 6 weeks.  I am going to arrange for him to undergo outpatient DC cardioversion.  His 2D echo did show moderate LV dysfunction with mild left atrial enlargement.  I will see him back in 3 months.  His EKG today shows atrial fibrillation with a ventricular response of 86.  Lorretta Harp, M.D., [place], [facility], [place] [address] [place], [place]  [address]  [phone] 11/24/2018 4:19 PM

## 2018-11-24 NOTE — Progress Notes (Addendum)
Christian Harrison returns today for follow-up.  He was seen by me 10/12/2018.  He was referred for newly recognized A. fib with RVR.  Currently rate controlled on Xarelto which she is taken uninterrupted for the last 6 weeks.  I am going to arrange for him to undergo outpatient DC cardioversion.  His 2D echo did show moderate LV dysfunction with mild left atrial enlargement.  I will see him back in 3 months.  His EKG today shows atrial fibrillation with a ventricular response of 86.  Lorretta Harp, M.D., [place], [facility], [place] [address] [place], [place]  [address]  [phone] 11/24/2018 4:19 PM

## 2018-11-24 NOTE — Assessment & Plan Note (Signed)
Mr. Yom returns today for follow-up.  He was seen by me 10/12/2018.  He was referred for newly recognized A. fib with RVR.  Currently rate controlled on Xarelto which she is taken uninterrupted for the last 6 weeks.  I am going to arrange for him to undergo outpatient DC cardioversion.  His 2D echo did show moderate LV dysfunction with mild left atrial enlargement.  I will see him back in 3 months.

## 2018-11-24 NOTE — Addendum Note (Signed)
Addended by: Venetia Maxon on: 11/24/2018 04:30 PM   Modules accepted: Orders

## 2018-11-25 ENCOUNTER — Other Ambulatory Visit: Payer: Self-pay

## 2018-11-30 LAB — CBC
Hematocrit: 44.6 % (ref 37.5–51.0)
Hemoglobin: 16 g/dL (ref 13.0–17.7)
MCH: 33.1 pg — AB (ref 26.6–33.0)
MCHC: 35.9 g/dL — AB (ref 31.5–35.7)
MCV: 92 fL (ref 79–97)
Platelets: 256 10*3/uL (ref 150–450)
RBC: 4.83 x10E6/uL (ref 4.14–5.80)
RDW: 13.2 % (ref 11.6–15.4)
WBC: 11.1 10*3/uL — ABNORMAL HIGH (ref 3.4–10.8)

## 2018-11-30 LAB — BASIC METABOLIC PANEL
BUN / CREAT RATIO: 26 — AB (ref 10–24)
BUN: 23 mg/dL (ref 8–27)
CO2: 23 mmol/L (ref 20–29)
Calcium: 9.8 mg/dL (ref 8.6–10.2)
Chloride: 103 mmol/L (ref 96–106)
Creatinine, Ser: 0.9 mg/dL (ref 0.76–1.27)
GFR calc Af Amer: 98 mL/min/{1.73_m2} (ref >59)
GFR calc non Af Amer: 84 mL/min/{1.73_m2} (ref >59)
GLUCOSE: 93 mg/dL (ref 65–99)
Potassium: 4.6 mmol/L (ref 3.5–5.2)
SODIUM: 141 mmol/L (ref 134–144)

## 2018-12-02 ENCOUNTER — Telehealth: Payer: Self-pay

## 2018-12-02 NOTE — Telephone Encounter (Signed)
Pt aware of lab results ./cy 

## 2018-12-02 NOTE — Telephone Encounter (Signed)
Pt called returning Christian Harrison's message, but she was already gone for the day. Please call to discuss results

## 2018-12-02 NOTE — Telephone Encounter (Signed)
lmtcb for recent lab results; letter of results mailed to patient

## 2018-12-03 ENCOUNTER — Ambulatory Visit (HOSPITAL_COMMUNITY): Payer: Medicare Other | Admitting: Anesthesiology

## 2018-12-03 ENCOUNTER — Telehealth: Payer: Self-pay | Admitting: Cardiovascular Disease

## 2018-12-03 ENCOUNTER — Telehealth: Payer: Self-pay

## 2018-12-03 ENCOUNTER — Ambulatory Visit (HOSPITAL_COMMUNITY)
Admission: RE | Admit: 2018-12-03 | Discharge: 2018-12-03 | Disposition: A | Discharge status: Home / Self Care | Payer: Medicare Other | Attending: Cardiovascular Disease | Admitting: Cardiovascular Disease

## 2018-12-03 ENCOUNTER — Other Ambulatory Visit: Payer: Self-pay

## 2018-12-03 ENCOUNTER — Encounter (HOSPITAL_COMMUNITY): Payer: Self-pay | Admitting: *Deleted

## 2018-12-03 ENCOUNTER — Encounter (HOSPITAL_COMMUNITY)
Admission: RE | Disposition: A | Discharge status: Home / Self Care | Payer: Self-pay | Source: Home / Self Care | Attending: Cardiovascular Disease

## 2018-12-03 DIAGNOSIS — Z538 Procedure and treatment not carried out for other reasons: Secondary | ICD-10-CM | POA: Insufficient documentation

## 2018-12-03 DIAGNOSIS — Z91120 Patient's intentional underdosing of medication regimen due to financial hardship: Secondary | ICD-10-CM | POA: Insufficient documentation

## 2018-12-03 DIAGNOSIS — J449 Chronic obstructive pulmonary disease, unspecified: Secondary | ICD-10-CM | POA: Diagnosis not present

## 2018-12-03 DIAGNOSIS — I10 Essential (primary) hypertension: Secondary | ICD-10-CM | POA: Insufficient documentation

## 2018-12-03 DIAGNOSIS — I482 Chronic atrial fibrillation, unspecified: Secondary | ICD-10-CM | POA: Insufficient documentation

## 2018-12-03 DIAGNOSIS — Z7901 Long term (current) use of anticoagulants: Secondary | ICD-10-CM | POA: Diagnosis not present

## 2018-12-03 DIAGNOSIS — Z87891 Personal history of nicotine dependence: Secondary | ICD-10-CM | POA: Diagnosis not present

## 2018-12-03 SURGERY — CANCELLED PROCEDURE
Anesthesia: General

## 2018-12-03 MED ORDER — SODIUM CHLORIDE 0.9 % IV SOLN
INTRAVENOUS | Status: DC | PRN
  Administered 2018-12-03: 500 mL via INTRAVENOUS

## 2018-12-03 NOTE — Telephone Encounter (Signed)
Spoke with pt wife regarding pt cancelled cardioversion and upcoming appt with APP on 3/18. Informed pt wife that OV with [place], PA will be to discuss reason for cancellation and provide education on the importance of continuing on Xarelto. Pt wife verbalized understanding and will relay info to pt

## 2018-12-03 NOTE — Anesthesia Preprocedure Evaluation (Addendum)
Anesthesia Evaluation  Patient identified by MRN, date of birth, ID band Patient awake    Reviewed: Allergy & Precautions, NPO status , Patient's Chart, lab work & pertinent test results  Airway Mallampati: II  TM Distance: >3 FB Neck ROM: Full    Dental  (+) Upper Dentures, Lower Dentures   Pulmonary asthma , COPD,  COPD inhaler, former smoker,    Pulmonary exam normal breath sounds clear to auscultation       Cardiovascular hypertension, Pt. on home beta blockers Normal cardiovascular exam+ dysrhythmias Atrial Fibrillation  Rhythm:Regular Rate:Normal  ECG: a-fib, rate 86   Neuro/Psych negative neurological ROS  negative psych ROS   GI/Hepatic Neg liver ROS, hiatal hernia, GERD  ,  Endo/Other  negative endocrine ROS  Renal/GU negative Renal ROS     Musculoskeletal negative musculoskeletal ROS (+)   Abdominal   Peds  Hematology HLD   Anesthesia Other Findings A-fib  Reproductive/Obstetrics                            Anesthesia Physical Anesthesia Plan  ASA: III  Anesthesia Plan: General   Post-op Pain Management:    Induction: Intravenous  PONV Risk Score and Plan: 2 and Propofol infusion and Treatment may vary due to age or medical condition  Airway Management Planned: Mask  Additional Equipment:   Intra-op Plan:   Post-operative Plan:   Informed Consent: I have reviewed the patients History and Physical, chart, labs and discussed the procedure including the risks, benefits and alternatives for the proposed anesthesia with the patient or authorized representative who has indicated his/her understanding and acceptance.     Dental advisory given  Plan Discussed with: CRNA  Anesthesia Plan Comments:        Anesthesia Quick Evaluation

## 2018-12-03 NOTE — Interval H&P Note (Signed)
History and Physical Interval Note:  12/03/2018 9:22 AM  Christian Harrison  has presented today for surgery, with the diagnosis of a fib  The various methods of treatment have been discussed with the patient and family. After consideration of risks, benefits and other options for treatment, the patient has consented to  Procedure(s): CARDIOVERSION (N/A) as a surgical intervention .  The patient's history has been reviewed, patient examined, no change in status, stable for surgery.  I have reviewed the patient's chart and labs.  Questions were answered to the patient's satisfaction.     Mertie Moores

## 2018-12-03 NOTE — Discharge Instructions (Signed)
Electrical Cardioversion, Care After °This sheet gives you information about how to care for yourself after your procedure. Your health care provider may also give you more specific instructions. If you have problems or questions, contact your health care provider. °What can I expect after the procedure? °After the procedure, it is common to have: °· Some redness on the skin where the shocks were given. °Follow these instructions at home: ° °· Do not drive for 24 hours if you were given a medicine to help you relax (sedative). °· Take over-the-counter and prescription medicines only as told by your health care provider. °· Ask your health care provider how to check your pulse. Check it often. °· Rest for 48 hours after the procedure or as told by your health care provider. °· Avoid or limit your caffeine use as told by your health care provider. °Contact a health care provider if: °· You feel like your heart is beating too quickly or your pulse is not regular. °· You have a serious muscle cramp that does not go away. °Get help right away if: ° °· You have discomfort in your chest. °· You are dizzy or you feel faint. °· You have trouble breathing or you are short of breath. °· Your speech is slurred. °· You have trouble moving an arm or leg on one side of your body. °· Your fingers or toes turn cold or blue. °This information is not intended to replace advice given to you by your health care provider. Make sure you discuss any questions you have with your health care provider. °Document Released: 07/13/2013 Document Revised: 04/25/2016 Document Reviewed: 03/28/2016 °Elsevier Interactive Patient Education © 2019 Elsevier Inc. ° °

## 2018-12-03 NOTE — Telephone Encounter (Signed)
New Message   PTs wife is calling because the PT was suppose to have a procedure today with Dr Acie Fredrickson but he missed one of his pills and Dr Acie Fredrickson would not do the procedure.  She said she doesn't know what they are suppose to do now Please call back

## 2018-12-03 NOTE — Telephone Encounter (Signed)
Returned call to wife she states that pt only missed one day of his medication and she is concerned why did they cancel the procedure. Informed her that this was explained to them at the hospital discharge. After reviewing the note pt "missed one dose of that medicine that I know of last week" and "may have missed more".  And this is the reason that cardioversion was cancelled d/t increased chance that pt may have stroke during the procedure. Informed wife that pt needs an appointment for evaluation to see if he needs to be scheduled for cardioversion again. Informed wife that pt is not to miss ANY doses of Xarelto between now and appointment AND between appointment and scheduled cardioversion. Verbalized understanding. I have put samples at the front desk to help with cost savings. She will come and pick these up before pt runs out.

## 2018-12-03 NOTE — Progress Notes (Signed)
Upon entering room to meet patient and questioning him about his blood thinner, pt states that he has "missed one dose of that medicine that I know of last week". Upon further questioning, pt reports that he "may have missed more". Wife states that he sometimes does not take it because of concern for cost. Pt educated that in order for his medication to work at as directed, he must take it as directed. Nahser MD made aware and advising to cancel this procedure.

## 2018-12-15 ENCOUNTER — Telehealth: Payer: Self-pay | Admitting: Allergy and Immunology

## 2018-12-15 NOTE — Telephone Encounter (Signed)
Can use Breo 200 - 1 inhalation 1 time per day, or Symbicort 160 - 2 inhalations twice a day, or Dulera 200-2 inhalations twice a day with clinic samples

## 2018-12-15 NOTE — Telephone Encounter (Signed)
This is Dr. Bruna Potter pts if need another alternative inhaler.

## 2018-12-15 NOTE — Telephone Encounter (Signed)
Wife called to see if we have any samples of breo. [phone].

## 2018-12-15 NOTE — Telephone Encounter (Signed)
Called wife and left a message letting her know we don't have any samples of Breo 200.

## 2018-12-15 NOTE — Telephone Encounter (Signed)
Dr. Neldon Mc we do not have any samples here in [place]. I can check in [place] tomorrow and let you know what I find.

## 2018-12-16 NOTE — Telephone Encounter (Signed)
Patient called back Christian Harrison advised and appt made to see Dr Neldon Mc

## 2018-12-16 NOTE — Telephone Encounter (Signed)
Symbicort 160 samples placed up front. Called patient to advise left voice message advising samples were up front and needs an appt since he is overdue for one.

## 2018-12-21 NOTE — Progress Notes (Signed)
Error in charting.

## 2018-12-22 ENCOUNTER — Other Ambulatory Visit: Payer: Self-pay

## 2018-12-22 ENCOUNTER — Encounter: Payer: Self-pay | Admitting: Cardiology

## 2018-12-22 ENCOUNTER — Ambulatory Visit: Payer: Medicare Other | Admitting: Cardiology

## 2018-12-22 VITALS — BP 139/88 | HR 114 | Ht 70.0 in | Wt 207.8 lb

## 2018-12-22 DIAGNOSIS — J449 Chronic obstructive pulmonary disease, unspecified: Secondary | ICD-10-CM | POA: Insufficient documentation

## 2018-12-22 DIAGNOSIS — I429 Cardiomyopathy, unspecified: Secondary | ICD-10-CM | POA: Insufficient documentation

## 2018-12-22 DIAGNOSIS — I4891 Unspecified atrial fibrillation: Secondary | ICD-10-CM | POA: Diagnosis not present

## 2018-12-22 DIAGNOSIS — J441 Chronic obstructive pulmonary disease with (acute) exacerbation: Secondary | ICD-10-CM

## 2018-12-22 DIAGNOSIS — Z7901 Long term (current) use of anticoagulants: Secondary | ICD-10-CM

## 2018-12-22 DIAGNOSIS — I10 Essential (primary) hypertension: Secondary | ICD-10-CM | POA: Diagnosis not present

## 2018-12-22 MED ORDER — DILTIAZEM HCL ER COATED BEADS 120 MG PO CP24: 120.0000 mg | ORAL_CAPSULE | Freq: Every day | ORAL | 3 refills | Status: DC

## 2018-12-22 MED ORDER — METOPROLOL TARTRATE 25 MG PO TABS: 12.5000 mg | ORAL_TABLET | Freq: Two times a day (BID) | ORAL | 3 refills | Status: DC

## 2018-12-22 NOTE — Assessment & Plan Note (Signed)
Change Coreg to Metoprolol- add Diltiazem 120 mg

## 2018-12-22 NOTE — Progress Notes (Signed)
12/22/2018 Christian Harrison   [birth year]  [id number]  Primary Physician Leonard Downing, MD Primary Cardiologist: dr Gwenlyn Found  HPI: The patient is a pleasant 73 year old male, former welder with COPD, who was referred to Dr. Gwenlyn Found in January 2020 for atrial fibrillation.  The patient had actually had atrial fibrillation in 2012 when he was in the hospital for a hernia repair.  Progress notes indicated he converted prior to discharge on diltiazem.  He was not anticoagulated.  If he had atrial fibrillation between then and now he was asymptomatic.  I did find an EKG from October 2019 that indicated he was in atrial fibrillation with controlled ventricular response.  He was unaware that he had atrial fibrillation at that time.  Patient was referred to Dr. Gwenlyn Found when when an EKG in January 2020 showed him to be in atrial fibrillation.  The patient was unaware that he had atrial fibrillation.  An echocardiogram was done which showed decrease in his LV function to 40 to 45%.  He had a mildly dilated left atrium.  Dr. Gwenlyn Found felt he was a CHADS  2 and placed him on Xarelto with plans to arrange outpatient cardioversion.    On December 03, 2018 he showed up for cardioversion but reported that he had missed 1 dose of his Xarelto.  He was sent back home without cardioversion and is in the office today in follow-up.  Since he was discharged he has developed a COPD exacerbation.  His primary care providers placed him on a Z-Pak.  The patient has a productive cough with brown sputum.  He has known COPD.  He has had these exacerbations in the past and at times have required steroids as well as antibiotics.  He denies any orthopnea or lower extremity edema.  On exam he does have some expiratory wheezing and scattered rhonchi.  He remains in atrial fibrillation, his ventricular response is 110.    Current Outpatient Medications  Medication Sig Dispense Refill  . Ascorbic Acid (VITAMIN C PO) Take 500 mg by mouth  daily.     . carvedilol (COREG) 12.5 MG tablet Take 6.25 mg by mouth daily.    . citalopram (CELEXA) 40 MG tablet Take 20 mg by mouth daily.    . Cyanocobalamin (VITAMIN B 12 PO) Take 1 tablet by mouth daily.     . fluticasone furoate-vilanterol (BREO ELLIPTA) 200-25 MCG/INH AEPB Inhale 1 puff into the lungs daily. 30 each 5  . ipratropium-albuterol (DUONEB) 0.5-2.5 (3) MG/3ML SOLN USE ONE VIAL IN THE NEBULIZER EVERY 4-6 HOURS IF NEEDED FOR COUGH OR WHEEZE (Patient taking differently: Inhale 3 mLs into the lungs every 4 (four) hours as needed (wheezing). USE ONE VIAL IN THE NEBULIZER EVERY 4-6 HOURS IF NEEDED FOR COUGH OR WHEEZE) 360 mL 1  . lovastatin (MEVACOR) 20 MG tablet Take 20 mg by mouth every morning.     . Multiple Vitamins-Minerals (MULTIVITAMINS THER. W/MINERALS) TABS Take 1 tablet by mouth daily.      . rivaroxaban (XARELTO) 20 MG TABS tablet Take 1 tablet (20 mg total) by mouth daily with supper. (Patient taking differently: Take 20 mg by mouth every morning. ) 90 tablet 1  . traZODone (DESYREL) 150 MG tablet Take 150 mg by mouth at bedtime as needed for sleep.    . vitamin A 8000 UNIT capsule Take 8,000 Units by mouth daily.    . vitamin E 400 UNIT capsule Take 400 Units by mouth daily.     Marland Kitchen  diltiazem (CARDIZEM CD) 120 MG 24 hr capsule Take 1 capsule (120 mg total) by mouth daily. 30 capsule 3  . metoprolol tartrate (LOPRESSOR) 25 MG tablet Take 0.5 tablets (12.5 mg total) by mouth 2 (two) times daily. 30 tablet 3   No current facility-administered medications for this visit.     Allergies  Allergen Reactions  . Penicillins Anaphylaxis and Other (See Comments)    Seizures     Past Medical History:  Diagnosis Date  . Acid reflux   . Asthma 09-08-11   controlled with inhalers  . Colon cancer ([place]) 09-08-11   colon ca dx 02/2010. Chemo x 12 sessions  . Complication of anesthesia    ? pneumonia after surgery in 2011  . COPD (chronic obstructive pulmonary disease) ([place])  09-08-11   previous smoker  . Dysrhythmia 03/2010   PAF after colon resection in the setting of partial SBO -  . Hernia   . Hiatal hernia   . Hypertension   . Incisional hernia s/p open [place] repair w mesh [id number] 09/13/2011    Social History   Socioeconomic History  . Marital status: Married    Spouse name: Not on file  . Number of children: Not on file  . Years of education: Not on file  . Highest education level: Not on file  Occupational History  . Not on file  Social Needs  . Financial resource strain: Not on file  . Food insecurity:    Worry: Not on file    Inability: Not on file  . Transportation needs:    Medical: Not on file    Non-medical: Not on file  Tobacco Use  . Smoking status: Former Smoker    Years: 1.00    Last attempt to quit: 09/18/1970    Years since quitting: 48.2  . Smokeless tobacco: Never Used  Substance and Sexual Activity  . Alcohol use: No  . Drug use: No  . Sexual activity: Yes    Birth control/protection: None  Lifestyle  . Physical activity:    Days per week: Not on file    Minutes per session: Not on file  . Stress: Not on file  Relationships  . Social connections:    Talks on phone: Not on file    Gets together: Not on file    Attends religious service: Not on file    Active member of club or organization: Not on file    Attends meetings of clubs or organizations: Not on file    Relationship status: Not on file  . Intimate partner violence:    Fear of current or ex partner: Not on file    Emotionally abused: Not on file    Physically abused: Not on file    Forced sexual activity: Not on file  Other Topics Concern  . Not on file  Social History Narrative  . Not on file     Family History  Problem Relation Age of Onset  . Heart disease Mother   . Allergic rhinitis Neg Hx   . Asthma Neg Hx      Review of Systems: General: negative for chills, fever, night sweats or weight changes.  Cardiovascular: negative for chest  pain, dyspnea on exertion, edema, orthopnea, palpitations, paroxysmal nocturnal dyspnea or shortness of breath Dermatological: negative for rash Respiratory: negative for cough or wheezing Urologic: negative for hematuria Abdominal: negative for nausea, vomiting, diarrhea, bright red blood per rectum, melena, or hematemesis Neurologic: negative for visual changes, syncope,  or dizziness All other systems reviewed and are otherwise negative except as noted above.    Blood pressure 139/88, pulse (!) 114, height 5\' 10"  (1.778 m), weight 207 lb 12.8 oz (94.3 kg), SpO2 96 %.  General appearance: alert, cooperative, appears stated age and no distress Lungs: decreased breath sounds, scatter rhonchi, expiratory wheezing Heart: irregularly irregular rhythm Extremities: no edema Skin: pale, cool, dry Neurologic: Grossly normal  EKG AF with VR 118  ASSESSMENT AND PLAN:   Atrial fibrillation with RVR (HCC) Plan OP DCCV if his COPD flair is better  Long term (current) use of anticoagulants Xarelto-  COPD with acute exacerbation (HCC) Change Coreg to Metoprolol- add Diltiazem 120 mg   PLAN  I will reschedule his OP DCCV for three weeks (assuming his COPD flair is improved).  I stopped his Coreg and placed him on low dose Metoprolol and Diltiazem CD 120 mg secondary to ongoing wheezing.  I've asked him to contact his PCP tomorrow if his COPD flair is not better, he may require a steroid dose pack.  He knows not to miss any doses of Xarelto.  Risks and benefits of the procedure were discussed and the patient is will to proceed.    Kerin Ransom PA-C 12/22/2018 4:34 PM

## 2018-12-22 NOTE — Patient Instructions (Addendum)
Medication Instructions:  STOP Coreg START Metoprolol 12.5mg  Take 1 tablet twice a day  START Diltizem CD 120mg  Take 1 tablet once a day If you need a refill on your cardiac medications before your next appointment, please call your pharmacy.   Lab work: COMPLETE LABS  ON Monday January 10, 2019. If you have labs (blood work) drawn today and your tests are completely normal, you will receive your results only by: Marland Kitchen MyChart Message (if you have MyChart) OR . A paper copy in the mail If you have any lab test that is abnormal or we need to change your treatment, we will call you to review the results.  Testing/Procedures: Your physician has recommended that you have a Cardioversion (DCCV). Electrical Cardioversion uses a jolt of electricity to your heart either through paddles or wired patches attached to your chest. This is a controlled, usually prescheduled, procedure. Defibrillation is done under light anesthesia in the hospital, and you usually go home the day of the procedure. This is done to get your heart back into a normal rhythm. You are not awake for the procedure. Please see the instruction sheet given to you today. SEE INSTRUCTIONS BELOW   Follow-Up: At [facility], you and your health needs are our priority.  As part of our continuing mission to provide you with exceptional heart care, we have created designated Provider Care Teams.  These Care Teams include your primary Cardiologist (physician) and Advanced Practice Providers (APPs -  Physician Assistants and Nurse Practitioners) who all work together to provide you with the care you need, when you need it. . Your physician recommends that you schedule a follow-up appointment in: [place], PA-C  Any Other Special Instructions Will Be Listed Below (If Applicable).    CARDIOVERSION INSTRUCTIONS   Dear MR [facility] Ste[place]  You are scheduled for a Cardioversion. Cardioversion on January 12, 2019 with Dr.PETER [facility].   Please arrive at the [facility] (Main Entrance A) at [facility]: [address] [place], [place] [address] at 8 am. (1 hour prior to procedure unless lab work is needed; if lab work is needed arrive 1.5 hours ahead)  DIET: Nothing to eat or drink after midnight except a sip of water with medications   Medication Instructions: NO MEDICATION TO HOLD PRIOR   Continue your anticoagulant:  You will need to continue your anticoagulant after your procedure until you  are told by your  Provider that it is safe to stop   Labs: If patient is on Coumadin, patient needs pt/INR, CBC, BMET within 3 days (No pt/INR needed for patients taking Xarelto, Eliquis, Pradaxa) For patients receiving anesthesia for TEE and all Cardioversion patients: BMET, CBC within 1 week COMPLETE LABS ON Monday January 10, 2019 Come to: the lab at [address] between the hours of 8:00 am and 4:30 pm. You do not have to be fasting.   You must have a responsible person to drive you home and stay in the waiting area during your procedure. Failure to do so could result in cancellation.  Bring your insurance cards.  *Special Note: Every effort is made to have your procedure done on time. Occasionally there are emergencies that occur at the hospital that may cause delays. Please be patient if a delay does occur.

## 2018-12-22 NOTE — Assessment & Plan Note (Signed)
Xarelto

## 2018-12-22 NOTE — Assessment & Plan Note (Signed)
Plan OP DCCV if his COPD flair is better

## 2018-12-22 NOTE — Assessment & Plan Note (Signed)
Presumably secondary to AF- consider ischemic evaluation if this doesn't improve with restoration of NSR

## 2018-12-23 ENCOUNTER — Other Ambulatory Visit: Payer: Self-pay | Admitting: Allergy and Immunology

## 2018-12-23 MED ORDER — FLUTICASONE FUROATE-VILANTEROL 200-25 MCG/INH IN AEPB: 1.0000 | INHALATION_SPRAY | Freq: Every day | RESPIRATORY_TRACT | 5 refills | Status: DC

## 2018-12-23 NOTE — Telephone Encounter (Signed)
Prescription has been sent in. Per the telephone contact about a week ago patient was given a sample of Symbicort 160 because the office did not have any Breo.

## 2018-12-23 NOTE — Telephone Encounter (Signed)
Wife called and said Clair Gulling was given Symbicort and he says it does not work as well as Firefighter. He would like a prescription for [facility] sent in to [facility] in [place].

## 2018-12-26 ENCOUNTER — Emergency Department (HOSPITAL_COMMUNITY): Payer: Medicare Other

## 2018-12-26 ENCOUNTER — Encounter (HOSPITAL_COMMUNITY): Payer: Self-pay

## 2018-12-26 ENCOUNTER — Emergency Department (HOSPITAL_COMMUNITY)
Admission: EM | Admit: 2018-12-26 | Discharge: 2018-12-26 | Disposition: A | Discharge status: Home / Self Care | Payer: Medicare Other | Attending: Emergency Medicine | Admitting: Emergency Medicine

## 2018-12-26 DIAGNOSIS — I4891 Unspecified atrial fibrillation: Secondary | ICD-10-CM | POA: Diagnosis not present

## 2018-12-26 DIAGNOSIS — Z7901 Long term (current) use of anticoagulants: Secondary | ICD-10-CM | POA: Insufficient documentation

## 2018-12-26 DIAGNOSIS — J449 Chronic obstructive pulmonary disease, unspecified: Secondary | ICD-10-CM | POA: Diagnosis not present

## 2018-12-26 DIAGNOSIS — Z79899 Other long term (current) drug therapy: Secondary | ICD-10-CM | POA: Diagnosis not present

## 2018-12-26 DIAGNOSIS — J181 Lobar pneumonia, unspecified organism: Secondary | ICD-10-CM | POA: Diagnosis not present

## 2018-12-26 DIAGNOSIS — Z87891 Personal history of nicotine dependence: Secondary | ICD-10-CM | POA: Diagnosis not present

## 2018-12-26 DIAGNOSIS — Z85038 Personal history of other malignant neoplasm of large intestine: Secondary | ICD-10-CM | POA: Diagnosis not present

## 2018-12-26 DIAGNOSIS — I10 Essential (primary) hypertension: Secondary | ICD-10-CM | POA: Diagnosis not present

## 2018-12-26 DIAGNOSIS — R05 Cough: Secondary | ICD-10-CM | POA: Diagnosis present

## 2018-12-26 DIAGNOSIS — J189 Pneumonia, unspecified organism: Secondary | ICD-10-CM

## 2018-12-26 DIAGNOSIS — E785 Hyperlipidemia, unspecified: Secondary | ICD-10-CM | POA: Insufficient documentation

## 2018-12-26 LAB — CBC WITH DIFFERENTIAL/PLATELET
Abs Immature Granulocytes: 0.09 10*3/uL — ABNORMAL HIGH (ref 0.00–0.07)
Basophils Absolute: 0 10*3/uL (ref 0.0–0.1)
Basophils Relative: 0 %
Eosinophils Absolute: 0 10*3/uL (ref 0.0–0.5)
Eosinophils Relative: 0 %
HEMATOCRIT: 39.9 % (ref 39.0–52.0)
Hemoglobin: 13.5 g/dL (ref 13.0–17.0)
IMMATURE GRANULOCYTES: 1 %
LYMPHS ABS: 2.2 10*3/uL (ref 0.7–4.0)
LYMPHS PCT: 16 %
MCH: 32.1 pg (ref 26.0–34.0)
MCHC: 33.8 g/dL (ref 30.0–36.0)
MCV: 94.8 fL (ref 80.0–100.0)
Monocytes Absolute: 1.2 10*3/uL — ABNORMAL HIGH (ref 0.1–1.0)
Monocytes Relative: 9 %
Neutro Abs: 10.3 10*3/uL — ABNORMAL HIGH (ref 1.7–7.7)
Neutrophils Relative %: 74 %
Platelets: 191 10*3/uL (ref 150–400)
RBC: 4.21 MIL/uL — ABNORMAL LOW (ref 4.22–5.81)
RDW: 13.2 % (ref 11.5–15.5)
WBC: 13.9 10*3/uL — ABNORMAL HIGH (ref 4.0–10.5)
nRBC: 0 % (ref 0.0–0.2)

## 2018-12-26 LAB — COMPREHENSIVE METABOLIC PANEL
ALT: 32 U/L (ref 0–44)
AST: 31 U/L (ref 15–41)
Albumin: 3.3 g/dL — ABNORMAL LOW (ref 3.5–5.0)
Alkaline Phosphatase: 57 U/L (ref 38–126)
Anion gap: 11 (ref 5–15)
BUN: 25 mg/dL — ABNORMAL HIGH (ref 8–23)
CHLORIDE: 102 mmol/L (ref 98–111)
CO2: 24 mmol/L (ref 22–32)
Calcium: 8.9 mg/dL (ref 8.9–10.3)
Creatinine, Ser: 0.94 mg/dL (ref 0.61–1.24)
GFR calc Af Amer: >60 mL/min (ref >60)
GFR calc non Af Amer: >60 mL/min (ref >60)
Glucose, Bld: 166 mg/dL — ABNORMAL HIGH (ref 70–99)
Potassium: 3.6 mmol/L (ref 3.5–5.1)
Sodium: 137 mmol/L (ref 135–145)
Total Bilirubin: 1.8 mg/dL — ABNORMAL HIGH (ref 0.3–1.2)
Total Protein: 6.1 g/dL — ABNORMAL LOW (ref 6.5–8.1)

## 2018-12-26 LAB — PROTIME-INR
INR: 1.4 — ABNORMAL HIGH (ref 0.8–1.2)
Prothrombin Time: 16.5 seconds — ABNORMAL HIGH (ref 11.4–15.2)

## 2018-12-26 MED ORDER — LEVOFLOXACIN IN D5W 750 MG/150ML IV SOLN
750.0000 mg | Freq: Once | INTRAVENOUS | Status: AC
  Administered 2018-12-26: 750 mg via INTRAVENOUS
  Filled 2018-12-26: qty 150

## 2018-12-26 MED ORDER — IPRATROPIUM-ALBUTEROL 0.5-2.5 (3) MG/3ML IN SOLN
3.0000 mL | Freq: Once | RESPIRATORY_TRACT | Status: AC
  Administered 2018-12-26: 3 mL via RESPIRATORY_TRACT
  Filled 2018-12-26: qty 3

## 2018-12-26 MED ORDER — LEVOFLOXACIN 500 MG PO TABS: 500.0000 mg | ORAL_TABLET | Freq: Every day | ORAL | 0 refills | Status: DC

## 2018-12-26 MED ORDER — SODIUM CHLORIDE 0.9 % IV SOLN
INTRAVENOUS | Status: DC
  Administered 2018-12-26: 08:00:00 via INTRAVENOUS

## 2018-12-26 MED ORDER — METHYLPREDNISOLONE SODIUM SUCC 125 MG IJ SOLR
125.0000 mg | INTRAMUSCULAR | Status: AC
  Administered 2018-12-26: 125 mg via INTRAVENOUS
  Filled 2018-12-26: qty 2

## 2018-12-26 NOTE — Discharge Instructions (Addendum)
As discussed, with your history of lung disease, and your new diagnosis of pneumonia it is very important that you monitor your condition carefully. Please schedule follow-up with your physician this week.  Be sure to continue taking your prescribed medication, specifically including the prednisone prescribed 2 days ago and the Brio inhaler.  Return here for any concerning changes in your condition.

## 2018-12-26 NOTE — ED Notes (Signed)
"  Patient verbalizes understanding of discharge instructions. Opportunity for questioning and answers were provided.  pt discharged from ED ."  

## 2018-12-26 NOTE — ED Notes (Signed)
ED Provider at bedside. 

## 2018-12-26 NOTE — ED Triage Notes (Addendum)
Pt endorses cough with mucous, shortness of breath, weakness and intermittent fevers x3 weeks. Pt reports coughed up blood this AM. Pt alert and oriented. Hx asthma, COPD, and a fib. Pt a fib on monitor. Pt denies recent travel or contact with sick people.

## 2018-12-26 NOTE — ED Provider Notes (Signed)
[place] EMERGENCY DEPARTMENT Provider Note   CSN: [id number] Arrival date & time: 12/26/18  3329    History   Chief Complaint Chief Complaint  Patient presents with  . Cough  . Shortness of Breath    HPI Christian Harrison is a 73 y.o. male.     HPI Patient presents with concern of ongoing cough, as well as new hemoptysis. Patient has multiple medical issues, including COPD.  When he uses multiple breathing treatments daily. He has seen his physician, is nearing the end of a course of azithromycin.  Not however, with concern for ongoing cough, and new hemoptysis as of today he presents for evaluation. He denies chest pain, abdominal pain, syncope. No fever recently. He is here with his wife who assists with the HPI.    Past Medical History:  Diagnosis Date  . Acid reflux   . Asthma 09-08-11   controlled with inhalers  . Colon cancer ([place]) 09-08-11   colon ca dx 02/2010. Chemo x 12 sessions  . Complication of anesthesia    ? pneumonia after surgery in 2011  . COPD (chronic obstructive pulmonary disease) ([place]) 09-08-11   previous smoker  . Dysrhythmia 03/2010   PAF after colon resection in the setting of partial SBO -  . Hernia   . Hiatal hernia   . Hypertension   . Incisional hernia s/p open [facility] repair w mesh [id number] 09/13/2011    Patient Active Problem List   Diagnosis Date Noted  . Long term (current) use of anticoagulants 12/22/2018  . COPD with acute exacerbation ([place]) 12/22/2018  . Cardiomyopathy ([place]) 12/22/2018  . Hyperlipidemia 10/12/2018  . Diarrhea 03/03/2013  . History of colon cancer, stage III 11/22/2012  . SBO (small bowel obstruction) ([place]) 09/19/2011  . Asthma 09/16/2011  . Hypertension 09/16/2011  . Incisional hernia s/p open [place] repair w mesh [id number] 09/13/2011  . Atrial fibrillation with RVR ([place]) 09/13/2011    Past Surgical History:  Procedure Laterality Date  . COLON SURGERY  09-08-11   surgery in 6'11  . DG  FINGER*L*  09-08-11   left index finger-tendon release  . HAND SURGERY     LEFT  . PORT-A-CATH REMOVAL  09/10/2011   Procedure: REMOVAL PORT-A-CATH;  Surgeon: Adin Hector, MD;  Location: [facility] ORS;  Service: General;  Laterality: N/A;  Removal Port-a-Cath  . VENTRAL HERNIA REPAIR  09/10/2011   Procedure: LAPAROSCOPIC VENTRAL HERNIA;  Surgeon: Adin Hector, MD;  Location: [facility] ORS;  Service: General;  Laterality: N/A;  Lysis of Adhessions, Converted to open, with Physio Mesh        Home Medications    Prior to Admission medications   Medication Sig Start Date End Date Taking? Authorizing Provider  Ascorbic Acid (VITAMIN C PO) Take 500 mg by mouth daily.     [provider]  citalopram (CELEXA) 40 MG tablet Take 20 mg by mouth daily.    [provider]  Cyanocobalamin (VITAMIN B 12 PO) Take 1 tablet by mouth daily.     [provider]  diltiazem (CARDIZEM CD) 120 MG 24 hr capsule Take 1 capsule (120 mg total) by mouth daily. 12/22/18 04/21/19  Erlene Quan, PA-C  fluticasone furoate-vilanterol (BREO ELLIPTA) 200-25 MCG/INH AEPB Inhale 1 puff into the lungs daily. 12/23/18   Kozlow, Donnamarie Poag, MD  ipratropium-albuterol (DUONEB) 0.5-2.5 (3) MG/3ML SOLN USE ONE VIAL IN THE NEBULIZER EVERY 4-6 HOURS IF NEEDED FOR COUGH OR WHEEZE Patient taking differently:  Inhale 3 mLs into the lungs every 4 (four) hours as needed (wheezing). USE ONE VIAL IN THE NEBULIZER EVERY 4-6 HOURS IF NEEDED FOR COUGH OR WHEEZE 08/26/18   Kozlow, Donnamarie Poag, MD  lovastatin (MEVACOR) 20 MG tablet Take 20 mg by mouth every morning.  05/02/17   [provider]  metoprolol tartrate (LOPRESSOR) 25 MG tablet Take 0.5 tablets (12.5 mg total) by mouth 2 (two) times daily. 12/22/18 04/21/19  Erlene Quan, PA-C  Multiple Vitamins-Minerals (MULTIVITAMINS THER. W/MINERALS) TABS Take 1 tablet by mouth daily.      [provider]  rivaroxaban (XARELTO) 20 MG TABS tablet Take 1 tablet (20 mg total) by  mouth daily with supper. Patient taking differently: Take 20 mg by mouth every morning.  11/16/18   Lorretta Harp, MD  traZODone (DESYREL) 150 MG tablet Take 150 mg by mouth at bedtime as needed for sleep.    [provider]  vitamin A 8000 UNIT capsule Take 8,000 Units by mouth daily.    [provider]  vitamin E 400 UNIT capsule Take 400 Units by mouth daily.     [provider]    Family History Family History  Problem Relation Age of Onset  . Heart disease Mother   . Allergic rhinitis Neg Hx   . Asthma Neg Hx     Social History Social History   Tobacco Use  . Smoking status: Former Smoker    Years: 1.00    Last attempt to quit: 09/18/1970    Years since quitting: 48.3  . Smokeless tobacco: Never Used  Substance Use Topics  . Alcohol use: No  . Drug use: No     Allergies   Penicillins   Review of Systems Review of Systems  Constitutional:       Per HPI, otherwise negative  HENT:       Per HPI, otherwise negative  Respiratory:       Per HPI, otherwise negative  Cardiovascular:       Per HPI, otherwise negative  Gastrointestinal: Negative for vomiting.  Endocrine:       Negative aside from HPI  Genitourinary:       Neg aside from HPI   Musculoskeletal:       Per HPI, otherwise negative  Skin: Negative.   Neurological: Negative for syncope.  Hematological: Bruises/bleeds easily.       On Xarelto, history of atrial fibrillation     Physical Exam Updated Vital Signs BP (!) 141/77   Pulse (!) 114   Temp 99.7 F (37.6 C) (Oral)   Resp 18   SpO2 96%   Physical Exam Vitals signs and nursing note reviewed.  Constitutional:      General: He is not in acute distress.    Appearance: He is well-developed.  HENT:     Head: Normocephalic and atraumatic.  Eyes:     Conjunctiva/sclera: Conjunctivae normal.  Cardiovascular:     Rate and Rhythm: Tachycardia present. Rhythm irregular.  Pulmonary:     Breath sounds: Wheezing  present.  Abdominal:     General: There is no distension.  Skin:    General: Skin is warm and dry.  Neurological:     Mental Status: He is alert and oriented to person, place, and time.      ED Treatments / Results  Labs (all labs ordered are listed, but only abnormal results are displayed) Labs Reviewed  COMPREHENSIVE METABOLIC PANEL - Abnormal; Notable for the following  components:      Result Value   Glucose, Bld 166 (*)    BUN 25 (*)    Total Protein 6.1 (*)    Albumin 3.3 (*)    Total Bilirubin 1.8 (*)    All other components within normal limits  CBC WITH DIFFERENTIAL/PLATELET - Abnormal; Notable for the following components:   WBC 13.9 (*)    RBC 4.21 (*)    Neutro Abs 10.3 (*)    Monocytes Absolute 1.2 (*)    Abs Immature Granulocytes 0.09 (*)    All other components within normal limits  PROTIME-INR - Abnormal; Notable for the following components:   Prothrombin Time 16.5 (*)    INR 1.4 (*)    All other components within normal limits    EKG Rate 118, atrial fibrillation abnormal  Radiology Dg Chest 2 View  Result Date: 12/26/2018 CLINICAL DATA:  Cough with chest tightness and wheezing. Hemoptysis. Fever. EXAM: CHEST - 2 VIEW COMPARISON:  08/11/2017 FINDINGS: Lungs are adequately inflated with mild prominence of the central bronchovascular markings but no focal lobar consolidation. Mild interstitial prominence over the left lower lobe. No significant effusion. Mild cardiomegaly. Mild stable chronic compression of 2 adjacent midthoracic vertebral bodies. IMPRESSION: Mild interstitial prominence over the left lower lobe which may be chronic versus atelectasis or early infection. Mild prominence of the central bronchovascular markings which may be due to mild vascular congestion versus acute bronchitic process. Mild cardiomegaly. Electronically Signed   By: Marin Olp M.D.   On: 12/26/2018 08:36    Procedures Procedures (including critical care  time)  Medications Ordered in ED Medications  0.9 %  sodium chloride infusion (has no administration in time range)  methylPREDNISolone sodium succinate (SOLU-MEDROL) 125 mg/2 mL injection 125 mg (has no administration in time range)     Initial Impression / Assessment and Plan / ED Course  I have reviewed the triage vital signs and the nursing notes.  Pertinent labs & imaging results that were available during my care of the patient were reviewed by me and considered in my medical decision making (see chart for details).    After the initial evaluation I reviewed the patient's sputum sample he brought with him, notable for some red material.  Update:, Patient in no distress, stating that he feels better. He is received breathing treatment, Solu-Medrol.   11:09 AM Aware of x-ray concerning for possible pneumonia Patient has no new oxygen requirement, states that he feels better with breathing treatment, steroids, has no evidence for bacteremia, sepsis. Given his improvement here, patient will be discharged, though with close outpatient follow-up instructions, ongoing therapy with antibiotics, steroids, bronchodilators No evidence for bacteremia, sepsis, atypical ACS, decompensation.   Final Clinical Impressions(s) / ED Diagnoses   Final diagnoses:  Community acquired pneumonia of left lower lobe of lung [facility])    ED Discharge Orders         Ordered    levofloxacin (LEVAQUIN) 500 MG tablet  Daily     12/26/18 1103           Carmin Muskrat, MD 12/26/18 1110

## 2018-12-26 NOTE — ED Notes (Signed)
Pt transported to xray 

## 2018-12-28 ENCOUNTER — Telehealth: Payer: Self-pay

## 2018-12-28 NOTE — Telephone Encounter (Signed)
Please advise 

## 2018-12-28 NOTE — Telephone Encounter (Signed)
Called and spoke with the patient's wife. She advised that she will take him to the triage tent outside of the ED and will call with further updates.

## 2018-12-28 NOTE — Telephone Encounter (Signed)
Patient wife called and stated the patient was taken to the hospital on 12/26/2018 coughing up blood and a deep cough. Patient was diagnosed with Pneumonia in the left lung but was discharged since it wasn't in both lungs. Patients wife states he had a shot of steroid, placed on prednisone & Levaquin 500 mg. Patient is coughing with every breath, coughing up pale thick yellow. PCP did sent in tylenol with codeine. There is no blood in the mucous at this time. Patient does not have a fever.   The wife is wondering what else can he do, he cant lay down without getting short of breath and coughing.   [facility]

## 2018-12-28 NOTE — Telephone Encounter (Signed)
He needs to go to the triage tent and get evaluated for Covid as he did have a fever when he was seen in the ER on 22 Match 2020. Go now.

## 2018-12-29 ENCOUNTER — Telehealth: Payer: Self-pay | Admitting: Allergy and Immunology

## 2018-12-29 NOTE — Telephone Encounter (Signed)
Called pt. States that despite the recommendations of Dr. Neldon Mc to go to the triage tent he did not go because when he went to the ED on 12/26/18 they said he did not need to be tested. Advised pt that the recommendation from yesterday still stands and that he should be evaluated at the tent if he feels he need to be tested. Pt expressed displeasure with the recommendation using profanity but ultimately agreed to go to the tent and then hung up.

## 2018-12-29 NOTE — Telephone Encounter (Signed)
Patient said he went to the ED a few days ago. He had pneumonia and they gave him an IV and some antibiotics and sent him home. The doctor there did not think he needed testing for Covid. He said the antibiotics are working and where his was first coughing up bloody mucus, it went to just white mucus and that is now, few and far between. His wife is wanting to go babysit a 74 year old and he has told her he doesn't think that is a good idea. She wants to know if Dr. Neldon Mc thinks he needs to be tested.

## 2019-01-07 ENCOUNTER — Telehealth: Payer: Self-pay | Admitting: Cardiovascular Disease

## 2019-01-07 NOTE — Telephone Encounter (Signed)
Can you please advise on this Cardioversion, if patient should cancel; it seems like you ordered it and now patient wants to schedule.

## 2019-01-07 NOTE — Telephone Encounter (Signed)
No message needed °

## 2019-01-07 NOTE — Telephone Encounter (Signed)
New Message           Patient is calling in to get his 01/12/19 appt for the hospital rescheduled, pls call and advise.

## 2019-01-07 NOTE — Telephone Encounter (Signed)
New message   Patient's wife states that the patient wants to reschedule a procedure for 01/12/2019. Please call to discuss.

## 2019-01-10 ENCOUNTER — Telehealth: Payer: Self-pay | Admitting: Cardiovascular Disease

## 2019-01-10 NOTE — Telephone Encounter (Signed)
This should be rescheduled for mid May. He should have a virtual visit set up to make sure he is doing OK.  Im ou this week so another APP or Dr Gwenlyn Found can do this.  I don't know if  Sharee Pimple has a schedule to see virtual patients but I believe she saw him as well  Kerin Ransom PA-C 01/10/2019 8:17 AM

## 2019-01-10 NOTE — Telephone Encounter (Signed)
Patient's wife called stating that he would like to reschedule his cardioversion.  He is currently schedule for 4/8, he would like to reschedule it for the middle of May.

## 2019-01-10 NOTE — Telephone Encounter (Signed)
Never mind about Sharee Pimple- she apparently didn't see him.  Kerin Ransom PA-C 01/10/2019 8:20 AM

## 2019-01-10 NOTE — Telephone Encounter (Signed)
I am happy to do a WebEx virtual visit on him this week or next if he wants to.  Otherwise, he can come back in the office in 6 to 8 weeks for office evaluation prior to cardioversion.  He should still be on Xarelto for his A. fib.

## 2019-01-10 NOTE — Telephone Encounter (Signed)
F/U Message          Patient is calling today to check the status of the rescheduling of his "Cardioversion" pls call and advise.

## 2019-01-10 NOTE — Telephone Encounter (Signed)
I attempted to contact patient regarding this, did not answer, LVM. I told patient the cardioversion needs to be moved to mid - May I will route this message to scheduling at hospital.  Also will route to another PA and MD to see if able to do virtual visit since Lurena Joiner is out.

## 2019-01-11 ENCOUNTER — Telehealth: Payer: Self-pay

## 2019-01-11 ENCOUNTER — Ambulatory Visit: Payer: Medicare Other | Admitting: Allergy and Immunology

## 2019-01-11 NOTE — Telephone Encounter (Signed)
Called and left a detailed message for the patient asking to give the office a call back that we are trying to get him scheduled for 4/8 with Dr. Gwenlyn Found for a Telehealth visit

## 2019-01-18 ENCOUNTER — Telehealth: Payer: Self-pay | Admitting: Cardiovascular Disease

## 2019-01-18 NOTE — Telephone Encounter (Signed)
Patient calling the office for samples of medication: ° ° °1.  What medication and dosage are you requesting samples for? rivaroxaban (XARELTO) 20 MG TABS tablet ° °2.  Are you currently out of this medication? yes ° ° °

## 2019-01-18 NOTE — Telephone Encounter (Signed)
Returned call to patient advised office out of Xarelto samples.

## 2019-01-26 ENCOUNTER — Ambulatory Visit: Payer: Medicare Other | Admitting: Cardiology

## 2019-01-26 NOTE — Telephone Encounter (Signed)
lmtcb to schedule virtual visit w/ dr. Gwenlyn Found prior to upcoming cardioversion

## 2019-01-30 ENCOUNTER — Encounter (HOSPITAL_COMMUNITY): Payer: Self-pay | Admitting: Emergency Medicine

## 2019-01-30 ENCOUNTER — Observation Stay (HOSPITAL_COMMUNITY)
Admission: EM | Admit: 2019-01-30 | Discharge: 2019-01-31 | Disposition: A | Discharge status: Home / Self Care | Payer: Medicare Other | Attending: Internal Medicine | Admitting: Internal Medicine

## 2019-01-30 ENCOUNTER — Emergency Department (HOSPITAL_COMMUNITY): Payer: Medicare Other

## 2019-01-30 ENCOUNTER — Other Ambulatory Visit: Payer: Self-pay

## 2019-01-30 DIAGNOSIS — Z85038 Personal history of other malignant neoplasm of large intestine: Secondary | ICD-10-CM | POA: Insufficient documentation

## 2019-01-30 DIAGNOSIS — I10 Essential (primary) hypertension: Secondary | ICD-10-CM | POA: Insufficient documentation

## 2019-01-30 DIAGNOSIS — Z87891 Personal history of nicotine dependence: Secondary | ICD-10-CM | POA: Insufficient documentation

## 2019-01-30 DIAGNOSIS — R0602 Shortness of breath: Secondary | ICD-10-CM | POA: Insufficient documentation

## 2019-01-30 DIAGNOSIS — R079 Chest pain, unspecified: Secondary | ICD-10-CM | POA: Diagnosis not present

## 2019-01-30 DIAGNOSIS — I480 Paroxysmal atrial fibrillation: Principal | ICD-10-CM | POA: Insufficient documentation

## 2019-01-30 DIAGNOSIS — Z7901 Long term (current) use of anticoagulants: Secondary | ICD-10-CM | POA: Diagnosis not present

## 2019-01-30 DIAGNOSIS — I4819 Other persistent atrial fibrillation: Secondary | ICD-10-CM | POA: Insufficient documentation

## 2019-01-30 DIAGNOSIS — I4891 Unspecified atrial fibrillation: Secondary | ICD-10-CM

## 2019-01-30 DIAGNOSIS — G4700 Insomnia, unspecified: Secondary | ICD-10-CM | POA: Insufficient documentation

## 2019-01-30 DIAGNOSIS — Z9049 Acquired absence of other specified parts of digestive tract: Secondary | ICD-10-CM | POA: Insufficient documentation

## 2019-01-30 DIAGNOSIS — F329 Major depressive disorder, single episode, unspecified: Secondary | ICD-10-CM | POA: Insufficient documentation

## 2019-01-30 DIAGNOSIS — J441 Chronic obstructive pulmonary disease with (acute) exacerbation: Secondary | ICD-10-CM | POA: Diagnosis not present

## 2019-01-30 DIAGNOSIS — Z20828 Contact with and (suspected) exposure to other viral communicable diseases: Secondary | ICD-10-CM | POA: Insufficient documentation

## 2019-01-30 DIAGNOSIS — R05 Cough: Secondary | ICD-10-CM

## 2019-01-30 DIAGNOSIS — R059 Cough, unspecified: Secondary | ICD-10-CM

## 2019-01-30 LAB — COMPREHENSIVE METABOLIC PANEL
ALT: 18 U/L (ref 0–44)
AST: 21 U/L (ref 15–41)
Albumin: 3.7 g/dL (ref 3.5–5.0)
Alkaline Phosphatase: 68 U/L (ref 38–126)
Anion gap: 14 (ref 5–15)
BUN: 15 mg/dL (ref 8–23)
CO2: 19 mmol/L — ABNORMAL LOW (ref 22–32)
Calcium: 9.2 mg/dL (ref 8.9–10.3)
Chloride: 104 mmol/L (ref 98–111)
Creatinine, Ser: 0.91 mg/dL (ref 0.61–1.24)
GFR calc Af Amer: >60 mL/min (ref >60)
GFR calc non Af Amer: >60 mL/min (ref >60)
Glucose, Bld: 184 mg/dL — ABNORMAL HIGH (ref 70–99)
Potassium: 3.8 mmol/L (ref 3.5–5.1)
Sodium: 137 mmol/L (ref 135–145)
Total Bilirubin: 1.2 mg/dL (ref 0.3–1.2)
Total Protein: 6.5 g/dL (ref 6.5–8.1)

## 2019-01-30 LAB — URINALYSIS, ROUTINE W REFLEX MICROSCOPIC
Bilirubin Urine: NEGATIVE
Glucose, UA: NEGATIVE mg/dL
Hgb urine dipstick: NEGATIVE
Ketones, ur: NEGATIVE mg/dL
Leukocytes,Ua: NEGATIVE
Nitrite: NEGATIVE
Protein, ur: NEGATIVE mg/dL
Specific Gravity, Urine: 1.014 (ref 1.005–1.030)
pH: 8 (ref 5.0–8.0)

## 2019-01-30 LAB — CBC
HCT: 42.1 % (ref 39.0–52.0)
Hemoglobin: 14.5 g/dL (ref 13.0–17.0)
MCH: 32.6 pg (ref 26.0–34.0)
MCHC: 34.4 g/dL (ref 30.0–36.0)
MCV: 94.6 fL (ref 80.0–100.0)
Platelets: 265 10*3/uL (ref 150–400)
RBC: 4.45 MIL/uL (ref 4.22–5.81)
RDW: 12.6 % (ref 11.5–15.5)
WBC: 14.2 10*3/uL — ABNORMAL HIGH (ref 4.0–10.5)
nRBC: 0 % (ref 0.0–0.2)

## 2019-01-30 LAB — LACTIC ACID, PLASMA: Lactic Acid, Venous: 1.6 mmol/L (ref 0.5–1.9)

## 2019-01-30 LAB — BRAIN NATRIURETIC PEPTIDE: B Natriuretic Peptide: 555.9 pg/mL — ABNORMAL HIGH (ref 0.0–100.0)

## 2019-01-30 LAB — SARS CORONAVIRUS 2 BY RT PCR (HOSPITAL ORDER, PERFORMED IN ~~LOC~~ HOSPITAL LAB): SARS Coronavirus 2: NEGATIVE

## 2019-01-30 LAB — TROPONIN I
Troponin I: <0.03 ng/mL (ref <0.03)
Troponin I: 0.03 ng/mL (ref <0.03)
Troponin I: <0.03 ng/mL (ref <0.03)

## 2019-01-30 MED ORDER — SODIUM CHLORIDE 0.9 % IV BOLUS
500.0000 mL | Freq: Once | INTRAVENOUS | Status: AC
  Administered 2019-01-30: 500 mL via INTRAVENOUS

## 2019-01-30 MED ORDER — FLUTICASONE FUROATE-VILANTEROL 200-25 MCG/INH IN AEPB: 1.0000 | INHALATION_SPRAY | Freq: Every day | RESPIRATORY_TRACT | Status: DC

## 2019-01-30 MED ORDER — AMIODARONE LOAD VIA INFUSION
150.0000 mg | Freq: Once | INTRAVENOUS | Status: AC
  Administered 2019-01-30: 17:00:00 150 mg via INTRAVENOUS
  Filled 2019-01-30: qty 83.34

## 2019-01-30 MED ORDER — ETOMIDATE 2 MG/ML IV SOLN
10.0000 mg | Freq: Once | INTRAVENOUS | Status: DC
  Filled 2019-01-30: qty 10

## 2019-01-30 MED ORDER — AMIODARONE HCL IN DEXTROSE 360-4.14 MG/200ML-% IV SOLN
60.0000 mg/h | INTRAVENOUS | Status: AC
  Administered 2019-01-30 (×2): 60 mg/h via INTRAVENOUS
  Filled 2019-01-30 (×2): qty 200

## 2019-01-30 MED ORDER — FLUTICASONE FUROATE-VILANTEROL 200-25 MCG/INH IN AEPB
1.0000 | INHALATION_SPRAY | Freq: Every day | RESPIRATORY_TRACT | Status: DC
  Administered 2019-01-31: 1 via RESPIRATORY_TRACT
  Filled 2019-01-30: qty 28

## 2019-01-30 MED ORDER — SODIUM CHLORIDE 0.9% FLUSH
3.0000 mL | Freq: Two times a day (BID) | INTRAVENOUS | Status: DC
  Administered 2019-01-30 (×2): 3 mL via INTRAVENOUS

## 2019-01-30 MED ORDER — ETOMIDATE 2 MG/ML IV SOLN
INTRAVENOUS | Status: DC | PRN
  Administered 2019-01-30: 10 mg via INTRAVENOUS

## 2019-01-30 MED ORDER — METOPROLOL TARTRATE 25 MG PO TABS
25.0000 mg | ORAL_TABLET | Freq: Two times a day (BID) | ORAL | Status: DC
  Administered 2019-01-30 – 2019-01-31 (×2): 25 mg via ORAL
  Filled 2019-01-30 (×2): qty 1

## 2019-01-30 MED ORDER — CITALOPRAM HYDROBROMIDE 10 MG PO TABS
20.0000 mg | ORAL_TABLET | Freq: Every day | ORAL | Status: DC
  Administered 2019-01-31: 20 mg via ORAL
  Filled 2019-01-30: qty 2

## 2019-01-30 MED ORDER — RIVAROXABAN 20 MG PO TABS
20.0000 mg | ORAL_TABLET | Freq: Every day | ORAL | Status: DC
  Administered 2019-01-30: 20 mg via ORAL
  Filled 2019-01-30: qty 1

## 2019-01-30 MED ORDER — POLYETHYLENE GLYCOL 3350 17 G PO PACK: 17.0000 g | PACK | Freq: Every day | ORAL | Status: DC | PRN

## 2019-01-30 MED ORDER — ALBUTEROL SULFATE (2.5 MG/3ML) 0.083% IN NEBU: 3.0000 mL | INHALATION_SOLUTION | RESPIRATORY_TRACT | Status: DC | PRN

## 2019-01-30 MED ORDER — METOPROLOL TARTRATE 25 MG PO TABS: 12.5000 mg | ORAL_TABLET | Freq: Two times a day (BID) | ORAL | Status: DC

## 2019-01-30 MED ORDER — DILTIAZEM HCL 25 MG/5ML IV SOLN
20.0000 mg | Freq: Once | INTRAVENOUS | Status: AC
  Administered 2019-01-30: 20 mg via INTRAVENOUS
  Filled 2019-01-30: qty 5

## 2019-01-30 MED ORDER — ACETAMINOPHEN 650 MG RE SUPP: 650.0000 mg | Freq: Four times a day (QID) | RECTAL | Status: DC | PRN

## 2019-01-30 MED ORDER — AMIODARONE HCL IN DEXTROSE 360-4.14 MG/200ML-% IV SOLN
30.0000 mg/h | INTRAVENOUS | Status: DC
  Administered 2019-01-31: 06:00:00 30 mg/h via INTRAVENOUS
  Filled 2019-01-30 (×2): qty 200

## 2019-01-30 MED ORDER — ACETAMINOPHEN 325 MG PO TABS
650.0000 mg | ORAL_TABLET | Freq: Four times a day (QID) | ORAL | Status: DC | PRN
  Administered 2019-01-31: 650 mg via ORAL
  Filled 2019-01-30: qty 2

## 2019-01-30 MED ORDER — ALBUTEROL SULFATE HFA 108 (90 BASE) MCG/ACT IN AERS
2.0000 | INHALATION_SPRAY | Freq: Once | RESPIRATORY_TRACT | Status: AC
  Administered 2019-01-30: 09:00:00 2 via RESPIRATORY_TRACT
  Filled 2019-01-30: qty 6.7

## 2019-01-30 MED ORDER — TRAZODONE HCL 50 MG PO TABS
150.0000 mg | ORAL_TABLET | Freq: Every evening | ORAL | Status: DC | PRN
  Administered 2019-01-30: 150 mg via ORAL
  Filled 2019-01-30: qty 3

## 2019-01-30 NOTE — Sedation Documentation (Signed)
Pt shocked at 150 joules by Anda Kraft, [place] per Sedonia Small, MD

## 2019-01-30 NOTE — ED Notes (Signed)
RN made contact with wife and gave update.

## 2019-01-30 NOTE — ED Provider Notes (Signed)
[facility] Emergency Department Provider Note MRN:  [id number]  Arrival date & time: 01/30/19     Chief Complaint   Shortness of Breath and Chest Pain   History of Present Illness   Christian Harrison is a 73 y.o. year-old male with a history of COPD, cardiomyopathy, atrial fibrillation presenting to the ED with chief complaint of chest pain or shortness of breath.  Gradual onset shortness of breath for the past 1 to 2 days.  Sharp central chest pain that began this morning.  Pain is constant, worse with dry cough.  Patient was diagnosed with pneumonia last month, continues to have mild dry cough.  Denies fever, no headache or vision change, no abdominal pain, no numbness weakness to the arms or legs, no dysuria.  General malaise, fatigue.  No exacerbating relieving factors.  Patient explains that he was supposed to get a cardioversion procedure but this was delayed due to his illness.  Review of Systems  A complete 10 system review of systems was obtained and all systems are negative except as noted in the HPI and PMH.   Patient's Health History    Past Medical History:  Diagnosis Date  . Acid reflux   . Asthma 09-08-11   controlled with inhalers  . Colon cancer ([place]) 09-08-11   colon ca dx 02/2010. Chemo x 12 sessions  . Complication of anesthesia    ? pneumonia after surgery in 2011  . COPD (chronic obstructive pulmonary disease) ([place]) 09-08-11   previous smoker  . Dysrhythmia 03/2010   PAF after colon resection in the setting of partial SBO -  . Hernia   . Hiatal hernia   . Hypertension   . Incisional hernia s/p open [facility] repair w mesh [id number] 09/13/2011    Past Surgical History:  Procedure Laterality Date  . COLON SURGERY  09-08-11   surgery in 6'11  . DG FINGER*L*  09-08-11   left index finger-tendon release  . HAND SURGERY     LEFT  . PORT-A-CATH REMOVAL  09/10/2011   Procedure: REMOVAL PORT-A-CATH;  Surgeon: Adin Hector, MD;  Location: [facility] OR[facility];   Service: General;  Laterality: N/A;  Removal Port-a-Cath  . VENTRAL HERNIA REPAIR  09/10/2011   Procedure: LAPAROSCOPIC VENTRAL HERNIA;  Surgeon: Adin Hector, MD;  Location: [facility];  Service: General;  Laterality: N/A;  Lysis of Adhessions, Converted to open, with Physio Mesh    Family History  Problem Relation Age of Onset  . Heart disease Mother   . Allergic rhinitis Neg Hx   . Asthma Neg Hx     Social History   Socioeconomic History  . Marital status: Married    Spouse name: Not on file  . Number of children: Not on file  . Years of education: Not on file  . Highest education level: Not on file  Occupational History  . Not on file  Social Needs  . Financial resource strain: Not on file  . Food insecurity:    Worry: Not on file    Inability: Not on file  . Transportation needs:    Medical: Not on file    Non-medical: Not on file  Tobacco Use  . Smoking status: Former Smoker    Years: 1.00    Last attempt to quit: 09/18/1970    Years since quitting: 48.4  . Smokeless tobacco: Never Used  Substance and Sexual Activity  . Alcohol use: No  . Drug use: No  . Sexual  activity: Yes    Birth control/protection: None  Lifestyle  . Physical activity:    Days per week: Not on file    Minutes per session: Not on file  . Stress: Not on file  Relationships  . Social connections:    Talks on phone: Not on file    Gets together: Not on file    Attends religious service: Not on file    Active member of club or organization: Not on file    Attends meetings of clubs or organizations: Not on file    Relationship status: Not on file  . Intimate partner violence:    Fear of current or ex partner: Not on file    Emotionally abused: Not on file    Physically abused: Not on file    Forced sexual activity: Not on file  Other Topics Concern  . Not on file  Social History Narrative  . Not on file     Physical Exam  Vital Signs and Nursing Notes reviewed Vitals:   01/30/19  1601 01/30/19 1614  BP:  (!) 127/100  Pulse:  88  Resp:  16  Temp:  98.9 F (37.2 C)  SpO2: 98% 98%    CONSTITUTIONAL: Well-appearing, NAD NEURO:  Alert and oriented x 3, no focal deficits EYES:  eyes equal and reactive ENT/NECK:  no LAD, no JVD CARDIO: Tachycardic rate, well-perfused, normal S1 and S2 PULM:  CTAB no wheezing or rhonchi GI/GU:  normal bowel sounds, non-distended, non-tender MSK/SPINE:  No gross deformities, no edema SKIN:  no rash, atraumatic PSYCH:  Appropriate speech and behavior  Diagnostic and Interventional Summary    EKG Interpretation  Date/Time:  Sunday January 30 2019 08:32:40 EDT Ventricular Rate:  139 PR Interval:    QRS Duration: 95 QT Interval:  312 QTC Calculation: 475 R Axis:   37 Text Interpretation:  Atrial fibrillation Abnormal R-wave progression, early transition Repolarization abnormality, prob rate related Confirmed by Gerlene Fee [phone]) on 01/30/2019 8:54:48 AM Also confirmed by Gerlene Fee [phone]), editor Philomena Doheny [phone])  on 01/30/2019 12:30:11 PM      Labs Reviewed  CBC - Abnormal; Notable for the following components:      Result Value   WBC 14.2 (*)    All other components within normal limits  COMPREHENSIVE METABOLIC PANEL - Abnormal; Notable for the following components:   CO2 19 (*)    Glucose, Bld 184 (*)    All other components within normal limits  BRAIN NATRIURETIC PEPTIDE - Abnormal; Notable for the following components:   B Natriuretic Peptide 555.9 (*)    All other components within normal limits  TROPONIN I - Abnormal; Notable for the following components:   Troponin I 0.03 (*)    All other components within normal limits  CULTURE, BLOOD (ROUTINE X 2)  SARS CORONAVIRUS 2 (HOSPITAL ORDER, [facility])  CULTURE, BLOOD (ROUTINE X 2)  LACTIC ACID, PLASMA  TROPONIN I  URINALYSIS, ROUTINE W REFLEX MICROSCOPIC  TROPONIN I    DG Chest Port 1 View  Final Result      Medications   sodium chloride flush (NS) 0.9 % injection 3 mL (has no administration in time range)  acetaminophen (TYLENOL) tablet 650 mg (has no administration in time range)    Or  acetaminophen (TYLENOL) suppository 650 mg (has no administration in time range)  polyethylene glycol (MIRALAX / GLYCOLAX) packet 17 g (has no administration in time range)  citalopram (CELEXA) tablet 20  mg (has no administration in time range)  traZODone (DESYREL) tablet 150 mg (has no administration in time range)  rivaroxaban (XARELTO) tablet 20 mg (has no administration in time range)  fluticasone furoate-vilanterol (BREO ELLIPTA) 200-25 MCG/INH 1 puff (has no administration in time range)  albuterol (PROVENTIL) (2.5 MG/3ML) 0.083% nebulizer solution 3 mL (has no administration in time range)  amiodarone (NEXTERONE) 1.8 mg/mL load via infusion 150 mg (has no administration in time range)    And  amiodarone (NEXTERONE PREMIX) 360-4.14 MG/200ML-% (1.8 mg/mL) IV infusion (has no administration in time range)    And  amiodarone (NEXTERONE PREMIX) 360-4.14 MG/200ML-% (1.8 mg/mL) IV infusion (has no administration in time range)  metoprolol tartrate (LOPRESSOR) tablet 25 mg (has no administration in time range)  sodium chloride 0.9 % bolus 500 mL (0 mLs Intravenous Stopped 01/30/19 1126)  diltiazem (CARDIZEM) injection 20 mg (20 mg Intravenous Given 01/30/19 0858)  albuterol (VENTOLIN HFA) 108 (90 Base) MCG/ACT inhaler 2 puff (2 puffs Inhalation Given 01/30/19 0858)     .Cardioversion Date/Time: 01/30/2019 4:41 PM Performed by: Maudie Flakes, MD Authorized by: Maudie Flakes, MD   Consent:    Consent obtained:  Verbal and written   Consent given by:  Patient   Risks discussed:  Pain and induced arrhythmia   Alternatives discussed:  Rate-control medication Pre-procedure details:    Cardioversion basis:  Elective   Rhythm:  Atrial fibrillation Patient sedated: Yes. Refer to sedation procedure documentation for details  of sedation.  Attempt one:    Cardioversion mode:  Synchronous   Waveform:  Biphasic   Shock (Joules):  100   Shock outcome:  No change in rhythm Attempt two:    Cardioversion mode:  Synchronous   Waveform:  Biphasic   Shock (Joules):  150   Shock outcome:  No change in rhythm Post-procedure details:    Patient status:  Awake   Patient tolerance of procedure:  Tolerated well, no immediate complications Comments:     Persistent A. fib despite 2 attempts at cardioversion.  Patient then became mildly apneic with mild hypoxia, significant myoclonus.  It was felt best to cease further efforts, allow patient to wake, proceed with medical management and rate control.   .Sedation Date/Time: 01/30/2019 4:42 PM Performed by: Maudie Flakes, MD Authorized by: Maudie Flakes, MD   Consent:    Consent obtained:  Verbal and written   Consent given by:  Patient   Risks discussed:  Allergic reaction and prolonged sedation necessitating reversal Universal protocol:    Immediately prior to procedure a time out was called: yes     Patient identity confirmation method:  Provided demographic data, verbally with patient, hospital-assigned identification number and arm band Indications:    Procedure performed:  Cardioversion   Procedure necessitating sedation performed by:  Physician performing sedation Pre-sedation assessment:    Time since last food or drink:  4 hours   ASA classification: class 2 - patient with mild systemic disease     Neck mobility: normal     Mallampati score:  I - soft palate, uvula, fauces, pillars visible   Pre-sedation assessments completed and reviewed: airway patency, cardiovascular function, mental status and respiratory function   Immediate pre-procedure details:    Reassessment: Patient reassessed immediately prior to procedure     Reviewed: vital signs, relevant labs/tests and NPO status     Verified: bag valve mask available, emergency equipment available,  intubation equipment available, IV patency confirmed, oxygen available and  suction available   Procedure details (see MAR for exact dosages):    Preoxygenation:  Nasal cannula   Sedation:  Etomidate   Intra-procedure monitoring:  Blood pressure monitoring, continuous capnometry, frequent vital sign checks, continuous pulse oximetry and cardiac monitor   Intra-procedure events: hypoxia     Intra-procedure management:  Airway repositioning   Total Provider sedation time (minutes):  22 Post-procedure details:    Attendance: Constant attendance by certified staff until patient recovered     Recovery: Patient returned to pre-procedure baseline     Post-sedation assessments completed and reviewed: airway patency, cardiovascular function, mental status and respiratory function     Patient is stable for discharge or admission: yes     Patient tolerance:  Tolerated well, no immediate complications Comments:     Mild apnea and hypoxia with full body myoclonus lasting briefly.  No significant interventions required, simple airway repositioning.   Critical Care Critical Care Documentation Critical care time provided by me (excluding procedures): 36 minutes  Condition necessitating critical care: A. fib with RVR  Components of critical care management: reviewing of prior records, laboratory and imaging interpretation, frequent re-examination and reassessment of vital signs, administration of IV diltiazem, discussion with consulting services    ED Course and Medical Decision Making  I have reviewed the triage vital signs and the nursing notes.  Pertinent labs & imaging results that were available during my care of the patient were reviewed by me and considered in my medical decision making (see below for details).  Patient is with A. fib and rapid ventricular response here in the emergency department, complaining of chest pain or shortness of breath.  Poor air movement, considering COPD  exacerbation versus symptomatic RVR versus ACS versus pneumonia.  Patient is not fluid overloaded on exam, will provide judicious fluids, diltiazem for rate control, albuterol inhaler, reassess.  Clinical Course as of Jan 30 1639  Sun Jan 30, 2019  0854 EKG is without ischemic features.  Given that patient is stable, has been in A. fib for an extended period of time, and we are unsure of his Xarelto compliance, will hold off on cardioversion here in the ED.   [MB]    Clinical Course User Index [MB] Maudie Flakes, MD    Discussed further with patient, who is adamant that he has not missed a dose of anticoagulation for the past month.  Discussed options with cardiology, who feels that patient is appropriate for cardioversion here in the emergency department and possible discharge thereafter.  Cardioversion attempted as described above, unfortunately unsuccessful.  Admitted to hospitalist service for rate control and further management.  Barth Kirks. Sedonia Small, [place]ami[place] [email or web address]  Final Clinical Impressions(s) / ED Diagnoses     ICD-10-CM   1. Chest pain, unspecified type R07.9   2. Cough R05 DG Chest [facility] 1 View    DG Chest Port 1 View  3. Atrial fibrillation, unspecified type [facility]) I48.91     ED Discharge Orders         Ordered    Amb referral to AFIB Clinic     01/30/19 Kimball, Matsuko Kretz M, MD 01/30/19 1645

## 2019-01-30 NOTE — H&P (Signed)
Date: 01/30/2019               Patient Name:  Christian Harrison MRN: [id number]  DOB: 19-Mar-1946 Age / Sex: 73 y.o., male   PCP: Leonard Downing, MD         Medical Service: Internal Medicine Teaching Service         Attending Physician: Dr. Sedonia Small Barth Kirks, MD    First Contact: Dr. Donne Hazel Pager: [phone]  Second Contact: Dr. Trilby Drummer Pager: [phone]       After Hours (After 5p/  First Contact Pager: [phone]  weekends / holidays): Second Contact Pager: [phone]   Chief Complaint: sob, a.fib   History of Present Illness: 3yoM with h/o a.fib and COPD who presented to the ED with 2 days of sob and 1 day of chest pain. He had pneumonia one month ago during which he experienced sob and productive cough. However, his symptoms resolved with antibiotics and he was feeling his usual self until 2-3 days ago. He describes general weakness, legs "like noodles", sob at rest (which is very unusual for him), palpitations, chest pain (non-radiating, localized to left chest). He wasn't able to sleep last night due to these symptoms. He's tried nebulizer breathing treatments without relief.He reports compliance with Xarelto.    He follows with cardiology and was supposed to have cardioversion in April but it got rescheduled to May after he got sick with pneumonia.   Initial workup pertinent for, negative troponin and ekg with a.fib with RVR (139bpm) Elevated BNP 555, leukocytosis 14, CMP and UA unremarkable. Lactate normal. COVID negative.   Denies lower extremity swelling, abdominal distention, productive cough, fevers, chills, n/v/d.   Meds:  No current facility-administered medications on file prior to encounter.    Current Outpatient Medications on File Prior to Encounter  Medication Sig Dispense Refill  . Ascorbic Acid (VITAMIN C PO) Take 500 mg by mouth daily.     Marland Kitchen azithromycin (ZITHROMAX) 250 MG tablet Take 250 mg by mouth See admin instructions. Take 2 tablets by mouth on day 1 then days  2-5 take 1 tablet until gone.    . carvedilol (COREG) 12.5 MG tablet Take 12.5 mg by mouth 2 (two) times daily with a meal.    . citalopram (CELEXA) 40 MG tablet Take 20 mg by mouth daily.    . Cyanocobalamin (VITAMIN B 12 PO) Take 1 tablet by mouth daily.     Marland Kitchen diltiazem (CARDIZEM CD) 120 MG 24 hr capsule Take 1 capsule (120 mg total) by mouth daily. 30 capsule 3  . fluticasone furoate-vilanterol (BREO ELLIPTA) 200-25 MCG/INH AEPB Inhale 1 puff into the lungs daily. 30 each 5  . ipratropium-albuterol (DUONEB) 0.5-2.5 (3) MG/3ML SOLN USE ONE VIAL IN THE NEBULIZER EVERY 4-6 HOURS IF NEEDED FOR COUGH OR WHEEZE (Patient taking differently: Inhale 3 mLs into the lungs every 4 (four) hours as needed (wheezing). USE ONE VIAL IN THE NEBULIZER EVERY 4-6 HOURS IF NEEDED FOR COUGH OR WHEEZE) 360 mL 1  . levofloxacin (LEVAQUIN) 500 MG tablet Take 1 tablet (500 mg total) by mouth daily. 7 tablet 0  . lovastatin (MEVACOR) 20 MG tablet Take 20 mg by mouth every morning.     . metoprolol tartrate (LOPRESSOR) 25 MG tablet Take 0.5 tablets (12.5 mg total) by mouth 2 (two) times daily. 30 tablet 3  . Multiple Vitamins-Minerals (MULTIVITAMINS THER. W/MINERALS) TABS Take 1 tablet by mouth daily.      . predniSONE (DELTASONE)  20 MG tablet Take 20 mg by mouth See admin instructions. Take 40mg  by mouth daily until better, then 20mg  every other day until gone.    . rivaroxaban (XARELTO) 20 MG TABS tablet Take 1 tablet (20 mg total) by mouth daily with supper. (Patient taking differently: Take 20 mg by mouth every morning. ) 90 tablet 1  . traZODone (DESYREL) 150 MG tablet Take 150 mg by mouth at bedtime as needed for sleep.    . vitamin A 8000 UNIT capsule Take 8,000 Units by mouth daily.    . vitamin E 400 UNIT capsule Take 400 Units by mouth daily.       No outpatient medications have been marked as taking for the 01/30/19 encounter [facility] Encounter).     Allergies: Allergies as of 01/30/2019 - Review Complete  01/30/2019  Allergen Reaction Noted  . Penicillins Anaphylaxis and Other (See Comments) 02/07/2011   Past Medical History:  Diagnosis Date  . Acid reflux   . Asthma 09-08-11   controlled with inhalers  . Colon cancer ([place]) 09-08-11   colon ca dx 02/2010. Chemo x 12 sessions  . Complication of anesthesia    ? pneumonia after surgery in 2011  . COPD (chronic obstructive pulmonary disease) ([place]) 09-08-11   previous smoker  . Dysrhythmia 03/2010   PAF after colon resection in the setting of partial SBO -  . Hernia   . Hiatal hernia   . Hypertension   . Incisional hernia s/p open [facility] repair w mesh [id number] 09/13/2011    Family History:  Family History  Problem Relation Age of Onset  . Heart disease Mother   . Allergic rhinitis Neg Hx   . Asthma Neg Hx      Social History: Home with Wife and 2 cats, quit smoke at 32yo, no alc, no drugs, no supplements  Review of Systems: A complete ROS was negative except as per HPI.   Physical Exam: Blood pressure (!) 120/91, pulse 94, temperature 98.6 F (37 C), temperature source Oral, resp. rate (!) 24, height 5\' 10"  (1.778 m), weight 91.2 kg, SpO2 100 %. Gen: elderly appearing gentleman, laying in bed, slightly restless and diaphoretic, NAD HENT: moist MM Cardiac: tachy, irregularly irregular, no m/r/g, no JVD Pulm: CTAB, normal wob on RA Abd: soft, NT, ND Ext: warm, no LEE Neuro: CN 2-12 grossly intact, moves all extremities   EKG: personally reviewed my interpretation is a.fib HR 139, no ischemic changes   CXR: personally reviewed my interpretation is stable cardiomegaly, no pulmonary consolidations or effusions   Assessment & Plan by Problem: Active Problems:   Atrial fibrillation ([place])  72yoM with h/o a.fib and COPD who presented to the ED with 2 days of sob and 1 day of chest pain found to be in a.fib with RVR. He was electrically cardioverted twice in the ED but it was unsuccessful so he was admitted for further treatment.    Dyspnea 2/2 A. Fib with RVR:  - He does have leukocytosis but doubt COPD exacerbation given lack of cough, no evidence of pneumonia on imaging. Leukocytosis is most likely reactive. No recent prednisone use. Afebrile. BCx drawn in the ED, will follow. BNP elevated 555 (no priors), but euvolemic on exam and no pulm congestion on x-ray.  - cardiology consulted in the ED, cardioversion x2 was performed but unsuccessful.  - appreciate cardiology's assistance, trying to medically cardiovert now with amiodarone  - continue xarelto, metoprolol  - tele  - trend troponins  COPD: lungs are CTAB, saturating well on RA - continue home albuterol prn and breo qd  - goal O2 sat 88-92%  Insomnia, Depression: continue home trazodone and citalopram   Diet: HH, NPO@MN  for another attempt at electrical cardioversion 4/27 DVT ppx: xarelto Code: full   Dispo: Admit patient to Inpatient with expected length of stay greater than 2 midnights.  Signed: Isabelle Course, MD 01/30/2019, 3:34 PM  Pager: [phone]

## 2019-01-30 NOTE — ED Notes (Signed)
ED Provider at bedside. 

## 2019-01-30 NOTE — Sedation Documentation (Signed)
Pt shocked at 100 joules by Anda Kraft, [place] per Sedonia Small, MD

## 2019-01-30 NOTE — ED Notes (Signed)
ED TO INPATIENT HANDOFF REPORT  ED Nurse Name and Phone #: Jinny Blossom [phone]  S Name/Age/Gender Larena Sox 73 y.o. male Room/Bed: 038C/038C  Code Status   Code Status: Full Code  Home/SNF/Other Home Patient oriented to: self, place, time and situation Is this baseline? Yes   Triage Complete: Triage complete  Chief Complaint cp   Triage Note Pt reports he has had intermittent chest pain and shortness of breath with a dry cough for several weeks. Pt reports he was supposed to have a cardioversion but it got postponed due to his cough. Pt denies fevers. Hr 120-160 at present.    Allergies Allergies  Allergen Reactions  . Penicillins Anaphylaxis and Other (See Comments)    Seizures Did it involve swelling of the face/tongue/throat, SOB, or low BP? Yes Did it involve sudden or severe rash/hives, skin peeling, or any reaction on the inside of your mouth or nose? No Did you need to seek medical attention at a hospital or doctor's office? Yes When did it last happen? If all above answers are "NO", may proceed with cephalosporin use.     Level of Care/Admitting Diagnosis ED Disposition    ED Disposition Condition Comment   Admit  Hospital Area: [facility] [[id number]]  Level of Care: Telemetry Cardiac [103]  Covid Evaluation: N/A  Diagnosis: Atrial fibrillation with RVR [facility]) [[id number]]  Admitting Physician: Sid Falcon [phone]  Attending Physician: Sid Falcon [[id number]]  PT Class (Do Not Modify): Observation [104]  PT Acc Code (Do Not Modify): Observation [10022]       B Medical/Surgery History Past Medical History:  Diagnosis Date  . Acid reflux   . Asthma 09-08-11   controlled with inhalers  . Colon cancer ([place]) 09-08-11   colon ca dx 02/2010. Chemo x 12 sessions  . Complication of anesthesia    ? pneumonia after surgery in 2011  . COPD (chronic obstructive pulmonary disease) ([place]) 09-08-11   previous smoker  . Dysrhythmia 03/2010   PAF  after colon resection in the setting of partial SBO -  . Hernia   . Hiatal hernia   . Hypertension   . Incisional hernia s/p open [facility] repair w mesh [id number] 09/13/2011   Past Surgical History:  Procedure Laterality Date  . COLON SURGERY  09-08-11   surgery in 6'11  . DG FINGER*L*  09-08-11   left index finger-tendon release  . HAND SURGERY     LEFT  . PORT-A-CATH REMOVAL  09/10/2011   Procedure: REMOVAL PORT-A-CATH;  Surgeon: Adin Hector, MD;  Location: [facility] ORS;  Service: General;  Laterality: N/A;  Removal Port-a-Cath  . VENTRAL HERNIA REPAIR  09/10/2011   Procedure: LAPAROSCOPIC VENTRAL HERNIA;  Surgeon: Adin Hector, MD;  Location: [facility] ORS;  Service: General;  Laterality: N/A;  Lysis of Adhessions, Converted to open, with Physio Mesh     A IV Location/Drains/Wounds Patient Lines/Drains/Airways Status   Active Line/Drains/Airways    Name:   Placement date:   Placement time:   Site:   Days:   Peripheral IV 01/30/19 Right Antecubital   01/30/19    0853    Antecubital   less than 1   Peripheral IV 01/30/19 Left Antecubital   01/30/19    0853    Antecubital   less than 1   Incision 09/10/11 Neck Left   09/10/11    1144     2699   Incision 09/10/11 Abdomen Other (Comment)   09/10/11  1144     2699   Incision - 5 Ports Abdomen 1: Upper;Left 2: Mid;Left 3: Lower;Left 4: Medial;Lower 5: Right;Lateral   09/10/11    1030     2699          Intake/Output Last 24 hours  Intake/Output Summary (Last 24 hours) at 01/30/2019 1538 Last data filed at 01/30/2019 1126 Gross per 24 hour  Intake 500 ml  Output -  Net 500 ml    Labs/Imaging Results for orders placed or performed during the hospital encounter of 01/30/19 (from the past 48 hour(s))  Blood culture (routine x 2)     Status: None (Preliminary result)   Collection Time: 01/30/19  8:45 AM  Result Value Ref Range   Specimen Description BLOOD LEFT ARM    Special Requests      BOTTLES DRAWN AEROBIC AND ANAEROBIC Blood Culture  adequate volume Performed at [facility] [address], [place], [place] [address]    Culture PENDING    Report Status PENDING   Urinalysis, Routine w reflex microscopic     Status: None   Collection Time: 01/30/19  8:45 AM  Result Value Ref Range   Color, Urine YELLOW YELLOW   APPearance CLEAR CLEAR   Specific Gravity, Urine 1.014 1.005 - 1.030   pH 8.0 5.0 - 8.0   Glucose, UA NEGATIVE NEGATIVE mg/dL   Hgb urine dipstick NEGATIVE NEGATIVE   Bilirubin Urine NEGATIVE NEGATIVE   Ketones, ur NEGATIVE NEGATIVE mg/dL   Protein, ur NEGATIVE NEGATIVE mg/dL   Nitrite NEGATIVE NEGATIVE   Leukocytes,Ua NEGATIVE NEGATIVE    Comment: Performed at [facility] [address], [place], [place] [address]  CBC     Status: Abnormal   Collection Time: 01/30/19  8:55 AM  Result Value Ref Range   WBC 14.2 (H) 4.0 - 10.5 K/uL   RBC 4.45 4.22 - 5.81 MIL/uL   Hemoglobin 14.5 13.0 - 17.0 g/dL   HCT 42.1 39.0 - 52.0 %   MCV 94.6 80.0 - 100.0 fL   MCH 32.6 26.0 - 34.0 pg   MCHC 34.4 30.0 - 36.0 g/dL   RDW 12.6 11.5 - 15.5 %   Platelets 265 150 - 400 K/uL   nRBC 0.0 0.0 - 0.2 %    Comment: Performed at [facility], [place] [address] [place], [place] [address]  Comprehensive metabolic panel     Status: Abnormal   Collection Time: 01/30/19  8:55 AM  Result Value Ref Range   Sodium 137 135 - 145 mmol/L   Potassium 3.8 3.5 - 5.1 mmol/L   Chloride 104 98 - 111 mmol/L   CO2 19 (L) 22 - 32 mmol/L   Glucose, Bld 184 (H) 70 - 99 mg/dL   BUN 15 8 - 23 mg/dL   Creatinine, Ser 0.91 0.61 - 1.24 mg/dL   Calcium 9.2 8.9 - 10.3 mg/dL   Total Protein 6.5 6.5 - 8.1 g/dL   Albumin 3.7 3.5 - 5.0 g/dL   AST 21 15 - 41 U/L   ALT 18 0 - 44 U/L   Alkaline Phosphatase 68 38 - 126 U/L   Total Bilirubin 1.2 0.3 - 1.2 mg/dL   GFR calc non Af Amer >60 >60 mL/min   GFR calc Af Amer >60 >60 mL/min   Anion gap 14 5 - 15    Comment: Performed at [facility], [place] [address] [place], [place]  [address]  Lactic acid,  plasma     Status: None   Collection Time: 01/30/19  8:55 AM  Result Value Ref Range   Lactic Acid, Venous 1.6 0.5 - 1.9 mmol/L    Comment: Performed at [facility] [address] [place], [place] [address]  Troponin I - ONCE - STAT     Status: None   Collection Time: 01/30/19  8:55 AM  Result Value Ref Range   Troponin I <0.03 <0.03 ng/mL    Comment: Performed at [facility] [address] [place], [place] [address]  Brain natriuretic peptide     Status: Abnormal   Collection Time: 01/30/19  8:55 AM  Result Value Ref Range   B Natriuretic Peptide 555.9 (H) 0.0 - 100.0 pg/mL    Comment: Performed at [facility] [address], [place], [place] [address]  SARS Coronavirus 2 [facility] order, Performed in [facility] hospital lab)     Status: None   Collection Time: 01/30/19  8:57 AM  Result Value Ref Range   SARS Coronavirus 2 NEGATIVE NEGATIVE    Comment: (NOTE) If result is NEGATIVE SARS-CoV-2 target nucleic acids are NOT DETECTED. The SARS-CoV-2 RNA is generally detectable in upper and lower  respiratory specimens during the acute phase of infection. The lowest  concentration of SARS-CoV-2 viral copies this assay can detect is 250  copies / mL. A negative result does not preclude SARS-CoV-2 infection  and should not be used as the sole basis for treatment or other  patient management decisions.  A negative result may occur with  improper specimen collection / handling, submission of specimen other  than nasopharyngeal swab, presence of viral mutation(s) within the  areas targeted by this assay, and inadequate number of viral copies  (<250 copies / mL). A negative result must be combined with clinical  observations, patient history, and epidemiological information. If result is POSITIVE SARS-CoV-2 target nucleic acids are DETECTED. The SARS-CoV-2 RNA is generally detectable in upper and lower  respiratory specimens dur ing the acute  phase of infection.  Positive  results are indicative of active infection with SARS-CoV-2.  Clinical  correlation with patient history and other diagnostic information is  necessary to determine patient infection status.  Positive results do  not rule out bacterial infection or co-infection with other viruses. If result is PRESUMPTIVE POSTIVE SARS-CoV-2 nucleic acids MAY BE PRESENT.   A presumptive positive result was obtained on the submitted specimen  and confirmed on repeat testing.  While 2019 novel coronavirus  (SARS-CoV-2) nucleic acids may be present in the submitted sample  additional confirmatory testing may be necessary for epidemiological  and / or clinical management purposes  to differentiate between  SARS-CoV-2 and other Sarbecovirus currently known to infect humans.  If clinically indicated additional testing with an alternate test  methodology [id number]) is advised. The SARS-CoV-2 RNA is generally  detectable in upper and lower respiratory sp ecimens during the acute  phase of infection. The expected result is Negative. Fact Sheet for Patients:  StrictlyIdeas.no Fact Sheet for Healthcare Providers: BankingDealers.co.za This test is not yet approved or cleared by the [place] FDA and has been authorized for detection and/or diagnosis of SARS-CoV-2 by FDA under an Emergency Use Authorization (EUA).  This EUA will remain in effect (meaning this test can be used) for the duration of the COVID-19 declaration under Section 564(b)(1) of the Act, 21 U.S.C. section 360bbb-3(b)(1), unless the authorization is terminated or revoked sooner. Performed at [facility], [place] [address], [place], [place] [address]   Troponin I - ONCE - STAT     Status: Abnormal   Collection Time: 01/30/19 11:40 AM  Result Value Ref Range   Troponin I 0.03 (HH) <0.03 ng/mL    Comment: CRITICAL RESULT CALLED TO, READ BACK BY AND VERIFIED  WITH: M.Shrita Thien,RN @ 1224 01/30/2019 [place] Performed at [facility], [place] [address] [place], [place] [address]    Dg Chest Port 1 View  Result Date: 01/30/2019 CLINICAL DATA:  Intermittent chest pain. Shortness of breath and dry cough. EXAM: PORTABLE CHEST 1 VIEW COMPARISON:  December 26, 2018 FINDINGS: Stable cardiomegaly. No pneumothorax. No pulmonary nodules, masses, or focal infiltrates. IMPRESSION: Stable cardiomegaly.  No other change. Electronically Signed   By: Dorise Bullion III M.D   On: 01/30/2019 09:21    Pending Labs Unresulted Labs (From admission, onward)    Start     Ordered   01/31/19 0500  CBC  Tomorrow morning,   R     01/30/19 1535   01/31/19 0102  Basic metabolic panel  Tomorrow morning,   R     01/30/19 1535   01/30/19 0843  Blood culture (routine x 2)  BLOOD CULTURE X 2,   STAT     01/30/19 0845          Vitals/Pain Today's Vitals   01/30/19 1520 01/30/19 1525 01/30/19 1530 01/30/19 1535  BP: (!) 143/88 128/81 (!) 120/91 127/83  Pulse: 87 96 94 93  Resp: 20 (!) 22 (!) 24 16  Temp: 98.6 F (37 C)     TempSrc: Oral     SpO2: 99% 100% 100% 100%  Weight:      Height:      PainSc:        Isolation Precautions Droplet and Contact precautions  Medications Medications  sodium chloride flush (NS) 0.9 % injection 3 mL (has no administration in time range)  acetaminophen (TYLENOL) tablet 650 mg (has no administration in time range)    Or  acetaminophen (TYLENOL) suppository 650 mg (has no administration in time range)  polyethylene glycol (MIRALAX / GLYCOLAX) packet 17 g (has no administration in time range)  etomidate (AMIDATE) injection (10 mg Intravenous Given 01/30/19 1506)  citalopram (CELEXA) tablet 20 mg (has no administration in time range)  traZODone (DESYREL) tablet 150 mg (has no administration in time range)  rivaroxaban (XARELTO) tablet 20 mg (has no administration in time range)  fluticasone furoate-vilanterol (BREO ELLIPTA) 200-25  MCG/INH 1 puff (has no administration in time range)  albuterol (VENTOLIN HFA) 108 (90 Base) MCG/ACT inhaler 1-2 puff (has no administration in time range)  amiodarone (NEXTERONE) 1.8 mg/mL load via infusion 150 mg (has no administration in time range)    And  amiodarone (NEXTERONE PREMIX) 360-4.14 MG/200ML-% (1.8 mg/mL) IV infusion (has no administration in time range)    And  amiodarone (NEXTERONE PREMIX) 360-4.14 MG/200ML-% (1.8 mg/mL) IV infusion (has no administration in time range)  metoprolol tartrate (LOPRESSOR) tablet 25 mg (has no administration in time range)  sodium chloride 0.9 % bolus 500 mL (0 mLs Intravenous Stopped 01/30/19 1126)  diltiazem (CARDIZEM) injection 20 mg (20 mg Intravenous Given 01/30/19 0858)  albuterol (VENTOLIN HFA) 108 (90 Base) MCG/ACT inhaler 2 puff (2 puffs Inhalation Given 01/30/19 0858)    Mobility walks with person assist High fall risk   Focused Assessments Cardiac Assessment Handoff:  Cardiac Rhythm: Atrial fibrillation Lab Results  Component Value Date  TROPONINI 0.03 (HH) 01/30/2019   No results found for: DDIMER Does the Patient currently have chest pain? No     R Recommendations: See Admitting Provider Note  Report given to:   Additional Notes:  Cardioversion/sedation performed, but unsuccessful pt still in a fib on monitor. Pt has 20 gauge R AC and 20 gauge left upper arm. Pt alert, oriented and maintained on room air at 100%, but placed on 2-4 L [place] due to procedure.

## 2019-01-30 NOTE — ED Notes (Signed)
Respiratory notified of need for presence during cardioversion

## 2019-01-30 NOTE — ED Triage Notes (Addendum)
Pt reports he has had intermittent chest pain and shortness of breath with a dry cough for several weeks. Pt reports he was supposed to have a cardioversion but it got postponed due to his cough. Pt denies fevers. Hr 120-160 at present.

## 2019-01-30 NOTE — ED Notes (Signed)
Pt wife was updated

## 2019-01-30 NOTE — ED Provider Notes (Signed)
Care assumed from Dr. Sedonia Small.  Please see his full H&P.  In short,  Christian Harrison is a 73 y.o. male presents for shortness of breath x2 days and chest pain x1 day.  Work-up started by previous provider includes negative troponin, CBC with leukocytosis of 14.2, CMP unremarkable, UA unremarkable.  Coronavirus testing is negative.  Lactic acid is within normal limits.  BNP elevated to 555.9.  Currently waiting second troponin and will reassess.  Patient is cardiologist is Dr. Gwenlyn Found.  Patient was supposed to have a cardioversion this month however had to reschedule due to pneumonia.  Patient thinks he is rescheduled for May, however is unsure of exact date.  Physical Exam  BP (!) 128/91   Pulse 86   Temp 99.1 F (37.3 C)   Resp 16   Ht 5\' 10"  (1.778 m)   Wt 91.2 kg   SpO2 96%   BMI 28.84 kg/m   Physical Exam Vitals signs and nursing note reviewed.  Constitutional:      Appearance: He is well-developed. He is not toxic-appearing.  HENT:     Head: Normocephalic and atraumatic.     Nose: Nose normal.     Mouth/Throat:     Mouth: Mucous membranes are moist.     Pharynx: Oropharynx is clear.  Eyes:     General: No scleral icterus.       Right eye: No discharge.        Left eye: No discharge.     Conjunctiva/sclera: Conjunctivae normal.  Neck:     Musculoskeletal: Normal range of motion.     Vascular: No JVD.  Cardiovascular:     Rate and Rhythm: Tachycardia present. Rhythm irregular.     Heart sounds: Normal heart sounds.  Pulmonary:     Effort: Pulmonary effort is normal.     Breath sounds: Normal breath sounds.     Comments: Patient speaking in full sentences, SPO2 is 96% on room air.  No use of accessory muscle.  Patient is in no acute distress. Abdominal:     General: There is no distension.     Tenderness: There is no abdominal tenderness. There is no guarding or rebound.  Musculoskeletal: Normal range of motion.     Right lower leg: No edema.     Left lower leg: No edema.   Skin:    General: Skin is warm and dry.  Neurological:     Mental Status: He is oriented to person, place, and time.     Comments: Fluent speech, no facial droop.  Psychiatric:        Behavior: Behavior normal.     ED Course/Procedures   Results for orders placed or performed during the hospital encounter of 01/30/19 (from the past 24 hour(s))  Blood culture (routine x 2)     Status: None (Preliminary result)   Collection Time: 01/30/19  8:45 AM  Result Value Ref Range   Specimen Description BLOOD LEFT ARM    Special Requests      BOTTLES DRAWN AEROBIC AND ANAEROBIC Blood Culture adequate volume Performed at [facility], [address], [place], [place] [address]    Culture PENDING    Report Status PENDING   Urinalysis, Routine w reflex microscopic     Status: None   Collection Time: 01/30/19  8:45 AM  Result Value Ref Range   Color, Urine YELLOW YELLOW   APPearance CLEAR CLEAR   Specific Gravity, Urine 1.014 1.005 - 1.030  pH 8.0 5.0 - 8.0   Glucose, UA NEGATIVE NEGATIVE mg/dL   Hgb urine dipstick NEGATIVE NEGATIVE   Bilirubin Urine NEGATIVE NEGATIVE   Ketones, ur NEGATIVE NEGATIVE mg/dL   Protein, ur NEGATIVE NEGATIVE mg/dL   Nitrite NEGATIVE NEGATIVE   Leukocytes,Ua NEGATIVE NEGATIVE  CBC     Status: Abnormal   Collection Time: 01/30/19  8:55 AM  Result Value Ref Range   WBC 14.2 (H) 4.0 - 10.5 K/uL   RBC 4.45 4.22 - 5.81 MIL/uL   Hemoglobin 14.5 13.0 - 17.0 g/dL   HCT 42.1 39.0 - 52.0 %   MCV 94.6 80.0 - 100.0 fL   MCH 32.6 26.0 - 34.0 pg   MCHC 34.4 30.0 - 36.0 g/dL   RDW 12.6 11.5 - 15.5 %   Platelets 265 150 - 400 K/uL   nRBC 0.0 0.0 - 0.2 %  Comprehensive metabolic panel     Status: Abnormal   Collection Time: 01/30/19  8:55 AM  Result Value Ref Range   Sodium 137 135 - 145 mmol/L   Potassium 3.8 3.5 - 5.1 mmol/L   Chloride 104 98 - 111 mmol/L   CO2 19 (L) 22 - 32 mmol/L   Glucose, Bld 184 (H) 70 - 99 mg/dL   BUN 15 8 - 23 mg/dL    Creatinine, Ser 0.91 0.61 - 1.24 mg/dL   Calcium 9.2 8.9 - 10.3 mg/dL   Total Protein 6.5 6.5 - 8.1 g/dL   Albumin 3.7 3.5 - 5.0 g/dL   AST 21 15 - 41 U/L   ALT 18 0 - 44 U/L   Alkaline Phosphatase 68 38 - 126 U/L   Total Bilirubin 1.2 0.3 - 1.2 mg/dL   GFR calc non Af Amer >60 >60 mL/min   GFR calc Af Amer >60 >60 mL/min   Anion gap 14 5 - 15  Lactic acid, plasma     Status: None   Collection Time: 01/30/19  8:55 AM  Result Value Ref Range   Lactic Acid, Venous 1.6 0.5 - 1.9 mmol/L  Troponin I - ONCE - STAT     Status: None   Collection Time: 01/30/19  8:55 AM  Result Value Ref Range   Troponin I <0.03 <0.03 ng/mL  Brain natriuretic peptide     Status: Abnormal   Collection Time: 01/30/19  8:55 AM  Result Value Ref Range   B Natriuretic Peptide 555.9 (H) 0.0 - 100.0 pg/mL  SARS Coronavirus 2 [facility] order, Performed in [facility] hospital lab)     Status: None   Collection Time: 01/30/19  8:57 AM  Result Value Ref Range   SARS Coronavirus 2 NEGATIVE NEGATIVE  Troponin I - ONCE - STAT     Status: Abnormal   Collection Time: 01/30/19 11:40 AM  Result Value Ref Range   Troponin I 0.03 (HH) <0.03 ng/mL    EKG Interpretation  Date/Time:  Sunday January 30 2019 08:32:40 EDT Ventricular Rate:  139 PR Interval:    QRS Duration: 95 QT Interval:  312 QTC Calculation: 475 R Axis:   37 Text Interpretation:  Atrial fibrillation Abnormal R-wave progression, early transition Repolarization abnormality, prob rate related Confirmed by Gerlene Fee [phone]) on 01/30/2019 8:54:48 AM Also confirmed by Gerlene Fee [phone]), editor Philomena Doheny [phone])  on 01/30/2019 12:30:11 PM       PORTABLE CHEST 1 VIEW COMPARISON: December 26, 2018 FINDINGS: Stable cardiomegaly. No pneumothorax. No pulmonary nodules, masses, or focal infiltrates. IMPRESSION: Stable cardiomegaly. No other  change. Electronically Signed By: Dorise Bullion III M.D On: 01/30/2019 09:21     MDM   73 year old male  presents today with gradual onset shortness of breath x2 days and chest pain x1 day.  On arrival EKG shows A. fib RVR his rate was between 120 and 160.  He states he has compliance with his Xarelto and his last dose was yesterday afternoon.  During my exam pt is addiment that he has not missed any doses of xarelto in the last 1 month.  Patient has not taken Xarelto yet today.  DDX includes COPD exacerbation, symptomatic A. fib RVR, ACS, pneumonia.  Work-up by previous provider includes first troponin negative, CBC with leukocytosis of 14.2.  CMP and UA unremarkable.  Lactic acid within normal limits, 1.6.  BNP is 555.9, do not have prior to compare.  He was also tested for coronavirus which came back negative.  His chest x-ray shows able cardiomegaly, no infiltrate or acute infectious processes seen.  He was started on IV Cardizem to attempt rate control.  Also given albuterol inhaler. Second troponin came back at 0.03.  On reexamination patient is still feeling short of breath, he denies symptom relief after using the inhaler.  While in the room his heart rate was ranging between 95 and 120.This case was discussed with Dr. Sedonia Small who has seen the patient and agrees with plan to admit.  Consult cardiology and discussed case with Dr. Margaretann Loveless and Dr. Meda Coffee for cardioversion in the ED since he has not missed an doses of Xarelto in at least 1 month. Attempted cardioversion with attending Dr. Sedonia Small, however unfortunately unsuccessful after 2 shocks, 100J and 150J. Please see his note.  Updated cardiologist Dr. Margaretann Loveless who recommends continuing with medical admission for rate control. Cardiology will continue to follow.  Spoke with Dr. Trilby Drummer with IM service  who agrees to assume care of patient and bring into the hospital for further evaluation and management.     This note was prepared with assistance of Systems analyst. Occasional wrong-word or sound-a-like substitutions may have occurred due to  the inherent limitations of voice recognition software.        Cherre Robins, PA-C 01/30/19 1738    Maudie Flakes, MD 01/31/19 [phone]

## 2019-01-30 NOTE — ED Notes (Signed)
Spoke with Mrs. Harston -- would like to be called [phone] with any information.

## 2019-01-30 NOTE — Consult Note (Addendum)
CARDIOLOGY CONSULT NOTE   Patient ID: Christian Harrison MRN: [id number], DOB/AGE: 03/27/1946   Admit date: 01/30/2019 Date of Consult: 01/30/2019  Primary Physician: Leonard Downing, MD Primary Cardiologist: Dr Gwenlyn Found  Reason for consult:  A-fib with RVR, DOE  Problem List  Past Medical History:  Diagnosis Date  . Acid reflux   . Asthma 09-08-11   controlled with inhalers  . Colon cancer ([place]) 09-08-11   colon ca dx 02/2010. Chemo x 12 sessions  . Complication of anesthesia    ? pneumonia after surgery in 2011  . COPD (chronic obstructive pulmonary disease) ([place]) 09-08-11   previous smoker  . Dysrhythmia 03/2010   PAF after colon resection in the setting of partial SBO -  . Hernia   . Hiatal hernia   . Hypertension   . Incisional hernia s/p open [facility] repair w mesh [id number] 09/13/2011    Past Surgical History:  Procedure Laterality Date  . COLON SURGERY  09-08-11   surgery in 6'11  . DG FINGER*L*  09-08-11   left index finger-tendon release  . HAND SURGERY     LEFT  . PORT-A-CATH REMOVAL  09/10/2011   Procedure: REMOVAL PORT-A-CATH;  Surgeon: Adin Hector, MD;  Location: [facility];  Service: General;  Laterality: N/A;  Removal Port-a-Cath  . VENTRAL HERNIA REPAIR  09/10/2011   Procedure: LAPAROSCOPIC VENTRAL HERNIA;  Surgeon: Adin Hector, MD;  Location: [facility];  Service: General;  Laterality: N/A;  Lysis of Adhessions, Converted to open, with Physio Mesh     Allergies  Allergies  Allergen Reactions  . Penicillins Anaphylaxis and Other (See Comments)    Seizures Did it involve swelling of the face/tongue/throat, SOB, or low BP? Yes Did it involve sudden or severe rash/hives, skin peeling, or any reaction on the inside of your mouth or nose? No Did you need to seek medical attention at a hospital or doctor's office? Yes When did it last happen? If all above answers are "NO", may proceed with cephalosporin use.    Christian Harrison is a 73 y.o. male who is  being seen today for the evaluation of atrial fibrillation with RVR at the request of Dr Legrand Como, [place].   HPI   The patient is a pleasant 73 year old male, former welder with COPD, who was referred to Dr. Gwenlyn Found in January 2020 for atrial fibrillation.  The patient had actually had atrial fibrillation in 2012 when he was in the hospital for a hernia repair.  Progress notes indicated he converted prior to discharge on diltiazem.  He was not anticoagulated.  If he had atrial fibrillation between then and now he was asymptomatic.  I did find an EKG from October 2019 that indicated he was in atrial fibrillation with controlled ventricular response.  He was unaware that he had atrial fibrillation at that time.  Patient was referred to Dr. Gwenlyn Found when when an EKG in January 2020 showed him to be in atrial fibrillation.  The patient was unaware that he had atrial fibrillation.  An echocardiogram was done which showed decrease in his LV function to 40 to 45%.  He had a mildly dilated left atrium.  Dr. Gwenlyn Found felt he was a CHADS  2 and placed him on Xarelto with plans to arrange outpatient cardioversion.    On December 03, 2018 he showed up for cardioversion but reported that he had missed 1 dose of his Xarelto.  He was sent back home without cardioversion.  He was  scheduled for cardioversion that was then canceled because of COVID-19 pandemic and rescheduled for Feb 17, 2019.  He comes in today with progressively worsening dyspnea on exertion now even at rest, he states that he can hardly do few steps without getting short of breath.  He denies any fever chills.  He has chronic dry cough secondary to COPD. EKG shows atrial fibrillation with RVR.  He states that he has not missed any doses of Xarelto.  Inpatient Medications  Family History Family History  Problem Relation Age of Onset  . Heart disease Mother   . Allergic rhinitis Neg Hx   . Asthma Neg Hx     Social History Social History    Socioeconomic History  . Marital status: Married    Spouse name: Not on file  . Number of children: Not on file  . Years of education: Not on file  . Highest education level: Not on file  Occupational History  . Not on file  Social Needs  . Financial resource strain: Not on file  . Food insecurity:    Worry: Not on file    Inability: Not on file  . Transportation needs:    Medical: Not on file    Non-medical: Not on file  Tobacco Use  . Smoking status: Former Smoker    Years: 1.00    Last attempt to quit: 09/18/1970    Years since quitting: 48.4  . Smokeless tobacco: Never Used  Substance and Sexual Activity  . Alcohol use: No  . Drug use: No  . Sexual activity: Yes    Birth control/protection: None  Lifestyle  . Physical activity:    Days per week: Not on file    Minutes per session: Not on file  . Stress: Not on file  Relationships  . Social connections:    Talks on phone: Not on file    Gets together: Not on file    Attends religious service: Not on file    Active member of club or organization: Not on file    Attends meetings of clubs or organizations: Not on file    Relationship status: Not on file  . Intimate partner violence:    Fear of current or ex partner: Not on file    Emotionally abused: Not on file    Physically abused: Not on file    Forced sexual activity: Not on file  Other Topics Concern  . Not on file  Social History Narrative  . Not on file    Review of Systems  General:  No chills, fever, night sweats or weight changes.  Cardiovascular:  No chest pain, dyspnea on exertion, edema, orthopnea, palpitations, paroxysmal nocturnal dyspnea. Dermatological: No rash, lesions/masses Respiratory: No cough, dyspnea Urologic: No hematuria, dysuria Abdominal:   No nausea, vomiting, diarrhea, bright red blood per rectum, melena, or hematemesis Neurologic:  No visual changes, wkns, changes in mental status. All other systems reviewed and are  otherwise negative except as noted above.  Physical Exam  Blood pressure 124/85, pulse 92, temperature 99.1 F (37.3 C), resp. rate 17, height 5\' 10"  (1.778 m), weight 91.2 kg, SpO2 96 %.  General: Pleasant, NAD Psych: Normal affect. Neuro: Alert and oriented X 3. Moves all extremities spontaneously. HEENT: Normal  Neck: Supple without bruits or JVD. Lungs:  Resp regular and unlabored, CTA. Heart: RRR no s3, s4, or murmurs. Abdomen: Soft, non-tender, non-distended, BS + x 4.  Extremities: No clubbing, cyanosis or edema. DP/PT/Radials 2+ and equal  bilaterally.  Labs  Recent Labs    01/30/19 0855 01/30/19 1140  TROPONINI <0.03 0.03*   Lab Results  Component Value Date   WBC 14.2 (H) 01/30/2019   HGB 14.5 01/30/2019   HCT 42.1 01/30/2019   MCV 94.6 01/30/2019   PLT 265 01/30/2019    Recent Labs  Lab 01/30/19 0855  NA 137  K 3.8  CL 104  CO2 19*  BUN 15  CREATININE 0.91  CALCIUM 9.2  PROT 6.5  BILITOT 1.2  ALKPHOS 68  ALT 18  AST 21  GLUCOSE 184*   Lab Results  Component Value Date   CHOL 136 09/23/2011   TRIG 105 09/23/2011   No results found for: DDIMER Invalid input(s): POCBNP  Radiology/Studies  Dg Chest Port 1 View  Result Date: 01/30/2019 CLINICAL DATA:  Intermittent chest pain. Shortness of breath and dry cough. EXAM: PORTABLE CHEST 1 VIEW COMPARISON:  December 26, 2018 FINDINGS: Stable cardiomegaly. No pneumothorax. No pulmonary nodules, masses, or focal infiltrates. IMPRESSION: Stable cardiomegaly.  No other change. Electronically Signed   By: Dorise Bullion III M.D   On: 01/30/2019 09:21   Echocardiogram: 11/03/2018  1. The left ventricle appears to be mild to moderately increased in size, has moderately increased wall thickness, with mild-moderately reduced systolic function of 56-21%.  2. Diffuse global hypokinesis of all LV walls, with reduced sensitivity for regional wall motion abnormalities.  3. Mild to moderate dilatation of the ascending  aorta.  4. Right ventricular systolic pressure is is mildly elevated.  5. The right ventricle is normal in size, has normal wall thickness and normal systolic function.  6. Mildly dilated left atrial size.  7. Moderately dilated right atrial size.  8. Mitral valve regurgitation is trivial by color flow Doppler.  9. The mitral valve normal in structure and function. 10. There is mildly calcified of the mitral valve. 11. Normal tricuspid valve. 12. Aortic valve regurgitation is mild to moderate by color flow Doppler. 13. Aortic valve tricuspid. 14. There is mild calcification of the aortic valve. 15. There is mild thickening of the aortic valve. 16. No atrial level shunt detected by color flow Doppler.  ECG: Atrial fibrillation with RVR and ventricular rates 140 bpm    ASSESSMENT AND PLAN  Atrial fibrillation with RVR (HCC) Ventricular rates on arrival 140 BPM now slower on cardizem drip The patient has not missed any doses of Xarelto Cardioverted x 2 in the ER, failed I will start amiodarone load and attempt another DCCV on Tuesday  He doesn't have signs of fluid overload on physical exam or CXR despite elevated BNP We will arrange for an outpatient follow up  Long term (current) use of anticoagulants Xarelto-  COPD with acute exacerbation ([place]) Continue home inhalers   Signed, Ena Dawley, MD, [facility] 01/30/2019, 1:53 PM

## 2019-01-31 ENCOUNTER — Telehealth: Payer: Self-pay

## 2019-01-31 DIAGNOSIS — I4891 Unspecified atrial fibrillation: Secondary | ICD-10-CM | POA: Diagnosis not present

## 2019-01-31 DIAGNOSIS — J441 Chronic obstructive pulmonary disease with (acute) exacerbation: Secondary | ICD-10-CM | POA: Diagnosis not present

## 2019-01-31 DIAGNOSIS — G4700 Insomnia, unspecified: Secondary | ICD-10-CM | POA: Diagnosis not present

## 2019-01-31 DIAGNOSIS — Z880 Allergy status to penicillin: Secondary | ICD-10-CM

## 2019-01-31 DIAGNOSIS — I10 Essential (primary) hypertension: Secondary | ICD-10-CM | POA: Diagnosis not present

## 2019-01-31 DIAGNOSIS — F329 Major depressive disorder, single episode, unspecified: Secondary | ICD-10-CM | POA: Diagnosis not present

## 2019-01-31 DIAGNOSIS — J449 Chronic obstructive pulmonary disease, unspecified: Secondary | ICD-10-CM

## 2019-01-31 DIAGNOSIS — Z79899 Other long term (current) drug therapy: Secondary | ICD-10-CM

## 2019-01-31 DIAGNOSIS — Z87891 Personal history of nicotine dependence: Secondary | ICD-10-CM

## 2019-01-31 DIAGNOSIS — Z8701 Personal history of pneumonia (recurrent): Secondary | ICD-10-CM

## 2019-01-31 DIAGNOSIS — F339 Major depressive disorder, recurrent, unspecified: Secondary | ICD-10-CM

## 2019-01-31 DIAGNOSIS — I480 Paroxysmal atrial fibrillation: Secondary | ICD-10-CM | POA: Diagnosis not present

## 2019-01-31 LAB — BASIC METABOLIC PANEL
Anion gap: 8 (ref 5–15)
BUN: 13 mg/dL (ref 8–23)
CO2: 24 mmol/L (ref 22–32)
Calcium: 8.8 mg/dL — ABNORMAL LOW (ref 8.9–10.3)
Chloride: 107 mmol/L (ref 98–111)
Creatinine, Ser: 0.9 mg/dL (ref 0.61–1.24)
GFR calc Af Amer: >60 mL/min (ref >60)
GFR calc non Af Amer: >60 mL/min (ref >60)
Glucose, Bld: 139 mg/dL — ABNORMAL HIGH (ref 70–99)
Potassium: 3.8 mmol/L (ref 3.5–5.1)
Sodium: 139 mmol/L (ref 135–145)

## 2019-01-31 LAB — CBC
HCT: 39.1 % (ref 39.0–52.0)
Hemoglobin: 13.8 g/dL (ref 13.0–17.0)
MCH: 33.1 pg (ref 26.0–34.0)
MCHC: 35.3 g/dL (ref 30.0–36.0)
MCV: 93.8 fL (ref 80.0–100.0)
Platelets: 228 10*3/uL (ref 150–400)
RBC: 4.17 MIL/uL — ABNORMAL LOW (ref 4.22–5.81)
RDW: 12.8 % (ref 11.5–15.5)
WBC: 13.1 10*3/uL — ABNORMAL HIGH (ref 4.0–10.5)
nRBC: 0 % (ref 0.0–0.2)

## 2019-01-31 MED ORDER — AMIODARONE HCL 200 MG PO TABS: 200.0000 mg | ORAL_TABLET | Freq: Every day | ORAL | 0 refills | Status: DC

## 2019-01-31 MED ORDER — AMIODARONE HCL 200 MG PO TABS
400.0000 mg | ORAL_TABLET | Freq: Three times a day (TID) | ORAL | Status: DC
  Administered 2019-01-31: 400 mg via ORAL
  Filled 2019-01-31: qty 2

## 2019-01-31 MED ORDER — METOPROLOL TARTRATE 50 MG PO TABS: 50.0000 mg | ORAL_TABLET | Freq: Two times a day (BID) | ORAL | 0 refills | Status: AC

## 2019-01-31 MED ORDER — METOPROLOL TARTRATE 50 MG PO TABS: 50.0000 mg | ORAL_TABLET | Freq: Two times a day (BID) | ORAL | Status: DC

## 2019-01-31 NOTE — Progress Notes (Signed)
   Subjective: [name] is feeling ok this morning. His shortness of breath has improved from last night. He denies chest pain or dizziness. He is not keen on cardioversion, as he states this did not work in the emergency room yesterday.   Objective:  Vital signs in last 24 hours: Vitals:   01/30/19 1945 01/30/19 2000 01/31/19 0432 01/31/19 0434  BP: (!) 144/82  (!) 151/97   Pulse: (!) 109  78   Resp: 18 14 20    Temp: 97.9 F (36.6 C)  98.3 F (36.8 C)   TempSrc: Oral  Oral   SpO2: 98%  98%   Weight:    92.4 kg  Height:       Constitution: awake, alert, lying in bed in NAD Cardio: normal rate; irregular rhythm, no m/r/g  Respiratory: normal work of breathing; lungs CTAB  Abdominal: soft, NTTP, non-distended MSK: no lower extremity edema, moving all extremities  Neuro: alert, quiet affect Skin: c/d/i   Assessment/Plan:  Active Problems:   Atrial fibrillation (HCC)   Atrial fibrillation with RVR ([place])  72yoM with h/o Harrison.fib and COPD who presented to the ED with 2 days of sob and 1 day of chest pain found to be in Harrison.fib with RVR. He was electrically cardioverted twice in the ED but it was unsuccessful so he was admitted for further treatment.   Harrison. Fib with RVR Hypetension Rate controlled with amiodarone gtt, metopralol increase to 50 mg bid for his hypertension. He is hoping to go home today and cardiology will plan for outpatient follow-up and cardioversion as he is currently rate controlled.   - switch amio gtt loading dose to po amio   - cont. xarelto 20 mg qd - increase metoprolol to 50 MG bid  COPD  - cont. breo ellipta -  Albuterol nebulizer q4h prn  VTE: lovenox IVF: none Diet: heart healthy Code: full   Dispo: Anticipated discharge today.   Christian Hazard A, DO 01/31/2019, 6:07 AM Pager: [phone]

## 2019-01-31 NOTE — Plan of Care (Signed)

## 2019-01-31 NOTE — Progress Notes (Signed)
  Date: 01/31/2019  Patient name: STEAVEN WHOLEY  Medical record number: [id number]  Date of birth: June 13, 1946   I have seen and evaluated Larena Sox and discussed their care with the Residency Team. Briefly, Mr. Baby is a 73 year old man with PMH of COPD, Afib who presented with SOB and chest pain and was found to have Afib with RVR.  He responded well to beta blocker and amiodarone.  He had attempted DCCV in the ED X 2 without success.  His SOB and chest pain have improved with improved heart rate.  Further symptoms included weakness, palpitations and decreased sleep.  He has been taking his xarelto.     Vitals:   01/31/19 0432 01/31/19 0740  BP: (!) 151/97   Pulse: 78 (!) 105  Resp: 20 (!) 22  Temp: 98.3 F (36.8 C)   SpO2: 98% 97%   General: Resting in bed, no distress Eyes: Anicteric sclerae CV: Irreg Irreg, normal rate, no murmur Pulm: CTAB, no wheezing Abd: +BS, NT Ext: No edema, thin Skin: Chronic skin changes, no apparent wounds on exposed skin Psych: Somewhat flat affect  EKG: Persistent Afib  Assessment and Plan: I have seen and evaluated the patient as outlined above. I agree with the formulated Assessment and Plan as detailed in the residents' note, with the following changes:   1. Afib with RVR - Discussed with cardiology, patient is not interested in remaining in house for another attempted cardioversion - Transition to oral amiodarone - Continue xarelto - Increase metoprolol, discontinue home diltiazem and coreg - Follow up with Cardiology in 2-3 weeks  Other issues per Dr. Aurelio Jew note, discharge today.    Sid Falcon, MD 4/27/20204:15 PM

## 2019-01-31 NOTE — Discharge Summary (Signed)
Name: Christian Harrison MRN: [id number] DOB: Dec 13, 1945 73 y.o. PCP: Leonard Downing, MD  Date of Admission: 01/30/2019  8:26 AM Date of Discharge: 01/31/2019 Attending Physician: Sid Falcon, MD  Discharge Diagnosis: 1. Atrial Fibrillation with RVR  Discharge Medications: Allergies as of 01/31/2019      Reactions   Penicillins Anaphylaxis, Other (See Comments)   Seizures Did it involve swelling of the face/tongue/throat, SOB, or low BP? Yes Did it involve sudden or severe rash/hives, skin peeling, or any reaction on the inside of your mouth or nose? No Did you need to seek medical attention at a hospital or doctor's office? Yes When did it last happen?teenager If all above answers are "NO", may proceed with cephalosporin use.      Medication List    STOP taking these medications   carvedilol 12.5 MG tablet Commonly known as:  COREG   diltiazem 120 MG 24 hr capsule Commonly known as:  CARDIZEM CD   fluticasone furoate-vilanterol 200-25 MCG/INH Aepb Commonly known as:  BREO ELLIPTA     TAKE these medications   acetaminophen 325 MG tablet Commonly known as:  TYLENOL Take 325 mg by mouth every 6 (six) hours as needed for headache.   amiodarone 200 MG tablet Commonly known as:  Pacerone Take 1 tablet (200 mg total) by mouth daily. Take 400 MG (2 tablets) every 12 hours for FIVE days THEN Take 200 MG every 12 hours for SEVEN days THEN Take 200 MG once per day   citalopram 40 MG tablet Commonly known as:  CELEXA Take 20 mg by mouth every evening.   ipratropium-albuterol 0.5-2.5 (3) MG/3ML Soln Commonly known as:  DUONEB USE ONE VIAL IN THE NEBULIZER EVERY 4-6 HOURS IF NEEDED FOR COUGH OR WHEEZE What changed:    how much to take  how to take this  when to take this  additional instructions   lovastatin 20 MG tablet Commonly known as:  MEVACOR Take 20 mg by mouth every evening.   metoprolol tartrate 50 MG tablet Commonly known as:  LOPRESSOR Take  1 tablet (50 mg total) by mouth 2 (two) times daily. What changed:    medication strength  how much to take   multivitamins ther. w/minerals Tabs tablet Take 1 tablet by mouth every evening.   rivaroxaban 20 MG Tabs tablet Commonly known as:  Xarelto Take 1 tablet (20 mg total) by mouth daily with supper. What changed:  when to take this   traZODone 150 MG tablet Commonly known as:  DESYREL Take 150 mg by mouth at bedtime.   vitamin A 8000 UNIT capsule Take 8,000 Units by mouth every evening.   vitamin E 400 UNIT capsule Take 400 Units by mouth every evening.       Disposition and follow-up:   Christian Harrison was discharged from [facility] in Stable condition.  At the hospital follow up visit please address:  1.  Atrial Fibrillation with RVR: Discharged with Amiodarone 400 MG bid for five days, 200 MG bid for seven days, then 200 MG qd. He will follow-up with cardiology for possible cardioversion. Metoprolol increased to 50 mg bid for rate control and hypertension. Diltiazem stopped.   2.  Labs / imaging needed at time of follow-up: none  3.  Pending labs/ test needing follow-up: none  Follow-up Appointments:   Cardiology follow-up   Hospital Course by problem list: Atrial Fibrillation with RVR [name]. Lamison is a 73yo male with  h/o a.fib and COPD who presented to the ED with 2 days of sob and 1 day of chest pain found to be in a.fib with RVR. He was electrically cardioverted twice in the ED but it was unsuccessful so he was admitted for further treatment. He has a history of atrial fibrillation and had been scheduled for cardioversion in April which was pushed back to May due to patient having pneumonia. He was started on amiodarone drip with control of his rate. His metoprolol was also increased to 50 MG bid for rate control and blood pressure control. He was scheduled to undergo repeat cardioversion on 4/28 but preferred to follow-up with  Cardiology in the outpatient setting. He was discharged with continuing his amiodarone loading dose, will transition to 200 MG qd after this and will follow-up with cardiology.   Discharge Vitals:   BP (!) 151/97 (BP Location: Right Arm)   Pulse (!) 105   Temp 98.3 F (36.8 C) (Oral)   Resp (!) 22   Ht 5\' 10"  (1.778 m)   Wt 92.4 kg   SpO2 97%   BMI 29.23 kg/m   Pertinent Labs, Studies, and Procedures:   Chest X-ray 4/27 IMPRESSION: Stable cardiomegaly.  No other change. Electronically Signed   By: Dorise Bullion III M.D   On: 01/30/2019 09:21  Discharge Instructions: Discharge Instructions    Amb referral to AFIB Clinic   Complete by:  As directed    Diet - low sodium heart healthy   Complete by:  As directed    Discharge instructions   Complete by:  As directed    You were hospitalized for Atrial Fibrillation with Rapid Ventricular Rate. Thank you for allowing [place] to be part of your care.   Please follow-up with Cardiology at your scheduled appointment time.   Please note these changes made to your medications:   Please START taking:   Amiodarone (PACERONE) 200 MG tablet - please take two tablets (400 MG) every 12 hours for FIVE days THEN take one tablet (200 MG) every 12 hours for SEVEN days THEN take one tablet (200 MG) once per day indefinitely   Please take Metoprolol (LOPRESSOR) 50 MG tablet two times per day, once in the morning and once in the evening.   Please STOP taking:   Diltiazem (CARDIZEM) 120 MG 24 hr. Capsule.   If you have increased shortness of breath, chest pain, or dizziness, please call your physician or return to the emergency room.   Increase activity slowly   Complete by:  As directed       Signed: Marty Heck, DO 01/31/2019, 12:57 PM   Pager: [phone]

## 2019-01-31 NOTE — Telephone Encounter (Signed)
Please inform [name] and patient that [employer] does have an ingredient that can increased heart rate and make the heart remain in atrial fibrillation.  It would be best not to have Breo and we can try a substitute and see how he does.  We can use Arnuity 200 -1 inhalation 1 time per day to replace his Breo.

## 2019-01-31 NOTE — Telephone Encounter (Signed)
Call from patient's wife Christian Harrison Pt is having a cardioversion procedure done.   Was told by the the doctor to stop using the [facility].  Wants to know if this is safe to do, or should he stop or continue using. Concerned about what medications will control his asthma.  Does not want to have an asthma flare.  Please advise

## 2019-01-31 NOTE — Progress Notes (Addendum)
Progress Note  Patient Name: Christian Harrison Date of Encounter: 01/31/2019  Primary Cardiologist: Dr. Quay Burow, MD   Subjective   Feeling much better, no chest pain, no shortness of breath.  No palpitations.  His heart rate was up in the 160 range on admission, currently in the 80s with atrial fibrillation much better rate control.  Feels better with [facility]  Inpatient Medications    Scheduled Meds: . citalopram  20 mg Oral Daily  . fluticasone furoate-vilanterol  1 puff Inhalation Daily  . metoprolol tartrate  25 mg Oral BID  . rivaroxaban  20 mg Oral Daily  . sodium chloride flush  3 mL Intravenous Q12H   Continuous Infusions: . amiodarone 30 mg/hr (01/31/19 0604)   PRN Meds: acetaminophen **OR** acetaminophen, albuterol, polyethylene glycol, traZODone   Vital Signs    Vitals:   01/30/19 2000 01/31/19 0432 01/31/19 0434 01/31/19 0740  BP:  (!) 151/97    Pulse:  78  (!) 105  Resp: 14 20  (!) 22  Temp:  98.3 F (36.8 C)    TempSrc:  Oral    SpO2:  98%  97%  Weight:   92.4 kg   Height:        Intake/Output Summary (Last 24 hours) at 01/31/2019 0747 Last data filed at 01/31/2019 0600 Gross per 24 hour  Intake 1358.64 ml  Output 300 ml  Net 1058.64 ml   Filed Weights   01/30/19 0835 01/31/19 0434  Weight: 91.2 kg 92.4 kg    Physical Exam   GEN: Well nourished, well developed, in no acute distress.  HEENT: Grossly normal.  Neck: Supple, no JVD, carotid bruits, or masses. Cardiac: IRREG normal rhythm, no murmurs, rubs, or gallops. No clubbing, cyanosis, edema.  Radials/DP/PT 2+ and equal bilaterally.  Respiratory:  Respirations regular and unlabored, clear to auscultation bilaterally. GI: Soft, nontender, nondistended, BS + x 4. MS: no deformity or atrophy. Skin: warm and dry, no rash. Neuro:  Strength and sensation are intact. Psych: AAOx3.  Normal affect.  Labs    Chemistry Recent Labs  Lab 01/30/19 0855 01/31/19 0427  NA 137 139  K 3.8 3.8   CL 104 107  CO2 19* 24  GLUCOSE 184* 139*  BUN 15 13  CREATININE 0.91 0.90  CALCIUM 9.2 8.8*  PROT 6.5  --   ALBUMIN 3.7  --   AST 21  --   ALT 18  --   ALKPHOS 68  --   BILITOT 1.2  --   GFRNONAA >60 >60  GFRAA >60 >60  ANIONGAP 14 8     Hematology Recent Labs  Lab 01/30/19 0855 01/31/19 0427  WBC 14.2* 13.1*  RBC 4.45 4.17*  HGB 14.5 13.8  HCT 42.1 39.1  MCV 94.6 93.8  MCH 32.6 33.1  MCHC 34.4 35.3  RDW 12.6 12.8  PLT 265 228    Cardiac Enzymes Recent Labs  Lab 01/30/19 0855 01/30/19 1140 01/30/19 1702  TROPONINI <0.03 0.03* <0.03   No results for input(s): TROPIPOC in the last 168 hours.   BNP Recent Labs  Lab 01/30/19 0855  BNP 555.9*     DDimer No results for input(s): DDIMER in the last 168 hours.   Radiology    Dg Chest Port 1 View  Result Date: 01/30/2019 CLINICAL DATA:  Intermittent chest pain. Shortness of breath and dry cough. EXAM: PORTABLE CHEST 1 VIEW COMPARISON:  December 26, 2018 FINDINGS: Stable cardiomegaly. No pneumothorax. No pulmonary nodules,  masses, or focal infiltrates. IMPRESSION: Stable cardiomegaly.  No other change. Electronically Signed   By: Dorise Bullion III M.D   On: 01/30/2019 09:21   Telemetry    AFIB rate 80-100 - Personally Reviewed  ECG    No new tracings as of 01/31/2019- Personally Reviewed  Cardiac Studies   Echocardiogram: 11/03/2018  1. The left ventricle appears to be mild to moderately increased in size, has moderately increased wall thickness, with mild-moderately reduced systolic function of 96-29%. 2. Diffuse global hypokinesis of all LV walls, with reduced sensitivity for regional wall motion abnormalities. 3. Mild to moderate dilatation of the ascending aorta. 4. Right ventricular systolic pressure is is mildly elevated. 5. The right ventricle is normal in size, has normal wall thickness and normal systolic function. 6. Mildly dilated left atrial size. 7. Moderately dilated right atrial  size. 8. Mitral valve regurgitation is trivial by color flow Doppler. 9. The mitral valve normal in structure and function. 10. There is mildly calcified of the mitral valve. 11. Normal tricuspid valve. 12. Aortic valve regurgitation is mild to moderate by color flow Doppler. 13. Aortic valve tricuspid. 14. There is mild calcification of the aortic valve. 15. There is mild thickening of the aortic valve. 16. No atrial level shunt detected by color flow Doppler.  Patient Profile     73 y.o. male with a hx of PAF on Xarelto, HTN and COPD who was admitted to [facility] with AF with RVR.   Assessment & Plan    1.  Atrial fibrillation with RVR: -Currently on Amiodarone gtt with plans for inpatient DCCV 02/01/2019 wit rate control per vitals review>> to be further determined by rounding MD by telemetry review  -Patient reports no missed doses of Xarelto>>continue  -Failed cardioversion x2 in the ED -After discussing with him further, he would really like to go home which does not seem unreasonable since his heart rate is under much better control.  My plan was to go ahead and complete his back of amiodarone IV and then transition him over to oral medications as the following:  -Amiodarone 400 mg p.o. twice daily for 5 days  -Amiodarone 200 mg p.o. twice daily for 7 days  -Amiodarone 200mg  p.o. once a day thereafter.  After complete load over the next 2 or 3 weeks if he is stable, he seems to be amenable to coming in for elective cardioversion.  2. Chronic anticoagulation: -On Xarelto 20 mg daily -No signs of acute bleeding  3.  Acute COPD exacerbation: -Continue with home inhalers, [place] Patter -Per internal medicine team.  He states that he like the way the [place] made him feel.  4. HTN: -Elevated, 151/97>144/82 -Will increase metoprolol to 50mg  BID  -Will need home meds adjusted prior to DC   Signed, Kathyrn Drown NP-C [facility] Pager: [phone] 01/31/2019, 7:47 AM     For  questions or updates, please contact   Please consult [email or web address] for contact info under Cardiology/STEMI.  Personally seen and examined. Agree with above.   I have made some adjustments to the note above.  I do not think it is unreasonable for him to go home to allow for more complete load of amiodarone before attempting cardioversion again.  He failed this twice in the ER yesterday.  Currently he is well rate controlled with amiodarone and metoprolol.  I think would be fine to send him out on metoprolol 50 mg twice daily with amiodarone load as described above. Stop Cardizem cd 120. Stop Coreg.   -  Continue Xarelto.   After complete amiodarone load, could try cardioversion as outpatient in 2-3 weeks.   Spoke to wife, and medicine team including Dr. Daryll Drown attending.  Candee Furbish, MD

## 2019-02-01 ENCOUNTER — Telehealth: Payer: Self-pay | Admitting: Cardiovascular Disease

## 2019-02-01 MED ORDER — FLUTICASONE FUROATE 200 MCG/ACT IN AEPB: 1.0000 | INHALATION_SPRAY | Freq: Every day | RESPIRATORY_TRACT | 0 refills | Status: DC

## 2019-02-01 NOTE — Telephone Encounter (Addendum)
Call to patient wife to make her aware of the change in her husbands medication.  Rx sent to the pharmacy.  [place] with this plan, pt wife verbalized understanding.

## 2019-02-01 NOTE — Telephone Encounter (Signed)
Pt has virtual on 5/6 with Kerin Ransom, PA

## 2019-02-01 NOTE — Telephone Encounter (Signed)
Pt's wife aware that the generic breo elipta  was stopped on discharge summary from hospital and that pt has office visit scheduled for 02-09-19 at 10:00 with Kerin Ransom PA .All questions answered the patient's wife verbalizes understanding .Adonis Housekeeper

## 2019-02-01 NOTE — Telephone Encounter (Signed)
  Wife is calling because there is some confusion about Mr Cast's medications. She states he got out of the hospital two days ago and he told her he was told not to take one of his meds but then they sent a sample of the med home with him. She also is confused about what his upcoming appt is for.

## 2019-02-02 ENCOUNTER — Telehealth: Payer: Self-pay | Admitting: Cardiovascular Disease

## 2019-02-02 NOTE — Telephone Encounter (Signed)
New Message    Pts wife is calling and is asking for Dr Gwenlyn Found to Call them back. She said it is in reference to the cardioversion.   Please call back

## 2019-02-02 NOTE — Telephone Encounter (Signed)
Spoke with the pts wife and granddaughter and their questions were answered on why the pt had a 02/09/19 follow up appt and are willing to make it a video visit.        Virtual Visit Pre-Appointment Phone Call   1. "What is the BEST phone number to call the day of the visit?" - include this in appointment notes [phone]  2. "Do you have or have access to (through a family member/friend) a smartphone with video capability that we can use for your visit?" a. If yes - list this number in appt notes as "cell" (if different from BEST phone #) and list the appointment type as a VIDEO visit in appointment notes b. If no - list the appointment type as a PHONE visit in appointment notes  3. Confirm consent - "In the setting of the current Covid19 crisis, you are scheduled for a video visit with your provider on 02/09/19. Just as we do with many in-office visits, in order for you to participate in this visit, we must obtain consent.  If you'd like, I can send this to your mychart (if signed up) or email for you to review.  Otherwise, I can obtain your verbal consent now.  All virtual visits are billed to your insurance company just like a normal visit would be.  By agreeing to a virtual visit, we'd like you to understand that the technology does not allow for your provider to perform an examination, and thus may limit your provider's ability to fully assess your condition. If your provider identifies any concerns that need to be evaluated in person, we will make arrangements to do so.  Finally, though the technology is pretty good, we cannot assure that it will always work on either your or our end, and in the setting of a video visit, we may have to convert it to a phone-only visit.  In either situation, we cannot ensure that we have a secure connection.  Are you willing to proceed?" STAFF: Did the patient verbally acknowledge consent to telehealth visit? Document YES/NO here: YES  4. Advise patient to be  prepared - "Two hours prior to your appointment, go ahead and check your blood pressure, pulse, oxygen saturation, and your weight (if you have the equipment to check those) and write them all down. When your visit starts, your provider will ask you for this information. If you have an Apple Watch or Kardia device, please plan to have heart rate information ready on the day of your appointment. Please have a pen and paper handy nearby the day of the visit as well."  5. Give patient instructions for MyChart download to smartphone OR [email or web address] as below if video visit (depending on what platform provider is using)  6. Inform patient they will receive a phone call 15 minutes prior to their appointment time (may be from unknown caller ID) so they should be prepared to answer    TELEPHONE CALL NOTE  Christian Harrison has been deemed a candidate for a follow-up tele-health visit to limit community exposure during the Covid-19 pandemic. I spoke with the patient via phone to ensure availability of phone/video source, confirm preferred email & phone number, and discuss instructions and expectations.  I reminded Christian Harrison to be prepared with any vital sign and/or heart rhythm information that could potentially be obtained via home monitoring, at the time of his visit. I reminded Christian Harrison to expect a phone call prior to his visit.  Stephani Police, RN 02/02/2019 9:19 AM   INSTRUCTIONS FOR DOWNLOADING THE MYCHART APP TO SMARTPHONE  - The patient must first make sure to have activated MyChart and know their login information - If Apple, go to [employer] and type in MyChart in the search bar and download the app. If Android, ask patient to go to [employer] and type in [place] in the search bar and download the app. The app is free but as with any other app downloads, their phone may require them to verify saved payment information or Apple/Android password.  - The patient will need to then log  into the app with their MyChart username and password, and select [facility] as their healthcare provider to link the account. When it is time for your visit, go to the MyChart app, find appointments, and click Begin Video Visit. Be sure to Select Allow for your device to access the Microphone and Camera for your visit. You will then be connected, and your provider will be with you shortly.  **If they have any issues connecting, or need assistance please contact MyChart service desk [phone])**  **If using a computer, in order to ensure the best quality for their visit they will need to use either of the following Internet Browsers: [employer], or Google Chrome**  IF USING DOXIMITY or DOXY.ME - The patient will receive a link just prior to their visit by text.     FULL LENGTH CONSENT FOR TELE-HEALTH VISIT   I hereby voluntarily request, consent and authorize [facility] and its employed or contracted physicians, physician assistants, nurse practitioners or other licensed health care professionals (the Practitioner), to provide me with telemedicine health care services (the "Services") as deemed necessary by the treating Practitioner. I acknowledge and consent to receive the Services by the Practitioner via telemedicine. I understand that the telemedicine visit will involve communicating with the Practitioner through live audiovisual communication technology and the disclosure of certain medical information by electronic transmission. I acknowledge that I have been given the opportunity to request an in-person assessment or other available alternative prior to the telemedicine visit and am voluntarily participating in the telemedicine visit.  I understand that I have the right to withhold or withdraw my consent to the use of telemedicine in the course of my care at any time, without affecting my right to future care or treatment, and that the Practitioner or I may terminate  the telemedicine visit at any time. I understand that I have the right to inspect all information obtained and/or recorded in the course of the telemedicine visit and may receive copies of available information for a reasonable fee.  I understand that some of the potential risks of receiving the Services via telemedicine include:  Marland Kitchen Delay or interruption in medical evaluation due to technological equipment failure or disruption; . Information transmitted may not be sufficient (e.g. poor resolution of images) to allow for appropriate medical decision making by the Practitioner; and/or  . In rare instances, security protocols could fail, causing a breach of personal health information.  Furthermore, I acknowledge that it is my responsibility to provide information about my medical history, conditions and care that is complete and accurate to the best of my ability. I acknowledge that Practitioner's advice, recommendations, and/or decision may be based on factors not within their control, such as incomplete or inaccurate data provided by me or distortions of diagnostic images or specimens that may result from electronic transmissions. I understand that  the practice of medicine is not an exact science and that Practitioner makes no warranties or guarantees regarding treatment outcomes. I acknowledge that I will receive a copy of this consent concurrently upon execution via email to the email address I last provided but may also request a printed copy by calling the office of [facility].    I understand that my insurance will be billed for this visit.   I have read or had this consent read to me. . I understand the contents of this consent, which adequately explains the benefits and risks of the Services being provided via telemedicine.  . I have been provided ample opportunity to ask questions regarding this consent and the Services and have had my questions answered to my satisfaction. . I give my  informed consent for the services to be provided through the use of telemedicine in my medical care  By participating in this telemedicine visit I agree to the above.

## 2019-02-04 LAB — CULTURE, BLOOD (ROUTINE X 2)
Culture: NO GROWTH
Culture: NO GROWTH
Special Requests: ADEQUATE
Special Requests: ADEQUATE

## 2019-02-09 ENCOUNTER — Telehealth (INDEPENDENT_AMBULATORY_CARE_PROVIDER_SITE_OTHER): Payer: Medicare Other | Admitting: Cardiology

## 2019-02-09 ENCOUNTER — Telehealth: Payer: Self-pay

## 2019-02-09 ENCOUNTER — Encounter: Payer: Self-pay | Admitting: Cardiology

## 2019-02-09 VITALS — BP 128/73 | HR 77 | Ht 70.0 in | Wt 201.0 lb

## 2019-02-09 DIAGNOSIS — I4891 Unspecified atrial fibrillation: Secondary | ICD-10-CM | POA: Diagnosis not present

## 2019-02-09 DIAGNOSIS — Z7901 Long term (current) use of anticoagulants: Secondary | ICD-10-CM

## 2019-02-09 DIAGNOSIS — J441 Chronic obstructive pulmonary disease with (acute) exacerbation: Secondary | ICD-10-CM

## 2019-02-09 NOTE — Patient Instructions (Addendum)
Medication Instructions:  Your physician recommends that you continue on your current medications as directed. Please refer to the Current Medication list given to you today. If you need a refill on your cardiac medications before your next appointment, please call your pharmacy.   Lab work: None  If you have labs (blood work) drawn today and your tests are completely normal, you will receive your results only by: Marland Kitchen MyChart Message (if you have MyChart) OR . A paper copy in the mail If you have any lab test that is abnormal or we need to change your treatment, we will call you to review the results.  Testing/Procedures: None   Follow-Up: At [facility], you and your health needs are our priority.  As part of our continuing mission to provide you with exceptional heart care, we have created designated Provider Care Teams.  These Care Teams include your primary Cardiologist (physician) and Advanced Practice Providers (APPs -  Physician Assistants and Nurse Practitioners) who all work together to provide you with the care you need, when you need it.  . Your physician recommends that you schedule a follow-up appointment in: few weeks with Dr Gwenlyn Found IN OFFICE ON DOD DAY PT NEEDS EKG.  Any Other Special Instructions Will Be Listed Below (If Applicable).

## 2019-02-09 NOTE — Progress Notes (Signed)
Virtual Visit via Video Note   This visit type was conducted due to national recommendations for restrictions regarding the COVID-19 Pandemic (e.g. social distancing) in an effort to limit this patient's exposure and mitigate transmission in our community.  Due to his co-morbid illnesses, this patient is at least at moderate risk for complications without adequate follow up.  This format is felt to be most appropriate for this patient at this time.  All issues noted in this document were discussed and addressed.  A limited physical exam was performed with this format.  Please refer to the patient's chart for his consent to telehealth for [facility].   Date:  02/09/2019   ID:  Larena Sox, DOB 02/03/1946, MRN [id number]  Patient Location: Home Provider Location: Home  PCP:  Leonard Downing, MD  Cardiologist:  Dr Gwenlyn Found Electrophysiologist:  None   Evaluation Performed:  Follow-Up Visit  Chief Complaint:  none  History of Present Illness:    Christian Harrison is a 73 y.o. male, a former welder with COPD, who was referred to Dr. Gwenlyn Found in January 2020 for atrial fibrillation.  The patient had actually had atrial fibrillation in 2012 when he was in the hospital for a hernia repair.  Progress notes indicated he converted prior to discharge on diltiazem.  He was not anticoagulated.  If he had atrial fibrillation between then and now he was asymptomatic.  I did find an EKG from October 2019 that indicated he was in atrial fibrillation with controlled ventricular response.  He was unaware that he had atrial fibrillation at that time.  The patient was referred to Dr. Gwenlyn Found in January 2020 when when an EKG  showed him to be in atrial fibrillation.  The patient was unaware that he had atrial fibrillation.  An echocardiogram was done 11/03/2018 which showed a decrease in his LV function to 40 to 45%.  He had a mildly dilated left atrium.  Dr. Gwenlyn Found felt he was a CHADS  2 and placed him on Xarelto  with plans to arrange outpatient cardioversion.    On December 03, 2018 he showed up for cardioversion but reported that he had missed 1 dose of his Xarelto.  He was sent back home without cardioversion and was seen in the office 12/22/2018 in follow-up.  He had developed a COPD exacerbation. His PCP had added steroids and a dose pack.  He was not felt to be ready for another attempt at [place] and this was put off till May but then put off again secondary to [place].  He was then admitted 01/30/2019 with COPD exacerbation and AF with RVR.  He failed to convert in the ED after 2 attempts at [place].  Amiodarone was added with plans to consider OP DCCV in a few weeks.  I contacted him today for follow up.  We initially had a video visit but due to technical issues on his end it was changed to a phone visit.  He has done well since discharge, no unusual dyspnea and his HR is in the 70's.  He has been unable to tell he was in AF in the past.   The patient does not have symptoms concerning for COVID-19 infection (fever, chills, cough, or new shortness of breath).    Past Medical History:  Diagnosis Date  . Acid reflux   . Asthma 09-08-11   controlled with inhalers  . Colon cancer ([place]) 09-08-11   colon ca dx 02/2010. Chemo x 12 sessions  .  Complication of anesthesia    ? pneumonia after surgery in 2011  . COPD (chronic obstructive pulmonary disease) ([place]) 09-08-11   previous smoker  . Dysrhythmia 03/2010   PAF after colon resection in the setting of partial SBO -  . Hernia   . Hiatal hernia   . Hypertension   . Incisional hernia s/p open [facility] repair w mesh [id number] 09/13/2011   Past Surgical History:  Procedure Laterality Date  . COLON SURGERY  09-08-11   surgery in 6'11  . DG FINGER*L*  09-08-11   left index finger-tendon release  . HAND SURGERY     LEFT  . PORT-A-CATH REMOVAL  09/10/2011   Procedure: REMOVAL PORT-A-CATH;  Surgeon: Adin Hector, MD;  Location: [facility] ORS;  Service: General;   Laterality: N/A;  Removal Port-a-Cath  . VENTRAL HERNIA REPAIR  09/10/2011   Procedure: LAPAROSCOPIC VENTRAL HERNIA;  Surgeon: Adin Hector, MD;  Location: [facility] ORS;  Service: General;  Laterality: N/A;  Lysis of [place], Converted to open, with Physio Mesh     Current Meds  Medication Sig  . acetaminophen (TYLENOL) 325 MG tablet Take 325 mg by mouth every 6 (six) hours as needed for headache.  Marland Kitchen amiodarone (PACERONE) 200 MG tablet Take 200 mg by mouth daily.  . citalopram (CELEXA) 40 MG tablet Take 20 mg by mouth every evening.   Marland Kitchen ipratropium-albuterol (DUONEB) 0.5-2.5 (3) MG/3ML SOLN USE ONE VIAL IN THE NEBULIZER EVERY 4-6 HOURS IF NEEDED FOR COUGH OR WHEEZE (Patient taking differently: Inhale 3 mLs into the lungs 2 (two) times a day. )  . lovastatin (MEVACOR) 20 MG tablet Take 20 mg by mouth every evening.   . metoprolol tartrate (LOPRESSOR) 50 MG tablet Take 1 tablet (50 mg total) by mouth 2 (two) times daily.  . Multiple Vitamins-Minerals (MULTIVITAMINS THER. W/MINERALS) TABS Take 1 tablet by mouth every evening.   . rivaroxaban (XARELTO) 20 MG TABS tablet Take 1 tablet (20 mg total) by mouth daily with supper. (Patient taking differently: Take 20 mg by mouth every evening. )  . traZODone (DESYREL) 150 MG tablet Take 150 mg by mouth at bedtime.   . vitamin A 8000 UNIT capsule Take 8,000 Units by mouth every evening.   . vitamin E 400 UNIT capsule Take 400 Units by mouth every evening.      Allergies:   Penicillins   Social History   Tobacco Use  . Smoking status: Former Smoker    Years: 1.00    Last attempt to quit: 09/18/1970    Years since quitting: 48.4  . Smokeless tobacco: Never Used  Substance Use Topics  . Alcohol use: No  . Drug use: No     Family Hx: The patient's family history includes Heart disease in his mother. There is no history of Allergic rhinitis or Asthma.  ROS:   Please see the history of present illness.    All other systems reviewed and are  negative.   Prior CV studies:   The following studies were reviewed today:   Labs/Other Tests and Data Reviewed:    EKG:  No ECG reviewed.  Recent Labs: 01/30/2019: ALT 18; B Natriuretic Peptide 555.9 01/31/2019: BUN 13; Creatinine, Ser 0.90; Hemoglobin 13.8; Platelets 228; Potassium 3.8; Sodium 139   Recent Lipid Panel Lab Results  Component Value Date/Time   CHOL 136 09/23/2011 04:30 AM   TRIG 105 09/23/2011 04:30 AM    Wt Readings from Last 3 Encounters:  02/09/19 201 lb (91.2 kg)  01/31/19 203 lb 11.2 oz (92.4 kg)  12/22/18 207 lb 12.8 oz (94.3 kg)     Objective:    Vital Signs:  BP 128/73   Pulse 77   Ht 5\' 10"  (1.778 m)   Wt 201 lb (91.2 kg)   BMI 28.84 kg/m   Pt was in no acute distress during video visit VITAL SIGNS:  reviewed  ASSESSMENT & PLAN:    COPD with acute exacerbation ([place]) Recent hospitalization 01/30/2019- currently stable  Atrial fibrillation with RVR ([place]) In setting of COPD exacerbation.  Amiodarone added with plans for repeat attempt at OP DCCV  Long term (current) use of anticoagulants Xarelto-CHADS VASC=2    COVID-19 Education: The signs and symptoms of COVID-19 were discussed with the patient and how to seek care for testing (follow up with PCP or arrange E-visit).  The importance of social distancing was discussed today.  Time:   Today, I have spent 15 minutes with the patient with telehealth technology discussing the above problems.     Medication Adjustments/Labs and Tests Ordered: Current medicines are reviewed at length with the patient today.  Concerns regarding medicines are outlined above.   Tests Ordered: No orders of the defined types were placed in this encounter.   Medication Changes: No orders of the defined types were placed in this encounter.   Disposition:  Follow up I think he should come in to the office for an EKG and a visit before arrnaging OP DCCV- he may be in NSR now since Amiodarone loading.    Signed, Kerin Ransom, PA-C  02/09/2019 10:09 AM    [facility]

## 2019-02-09 NOTE — Telephone Encounter (Signed)
Contacted patient to discuss AVS instructions. Patient agreeable with Luke recommendations and voiced understanding. 

## 2019-02-14 NOTE — Progress Notes (Signed)
Left message for patient on home number listed in Epic to call [facility] to arrange for COVID-19 testing prior to 02/17/2019 appointment for cardioversion. Awaiting call back. Patient can not be admitted on procedure day without result.

## 2019-02-15 ENCOUNTER — Telehealth: Payer: Self-pay | Admitting: Cardiology

## 2019-02-15 NOTE — Telephone Encounter (Signed)
I was contacted by endoscopy- Christian Harrison had been set up for cardioversion 5/14-this was done prior to his hospitalization in late April when Amiodarone was added.  He has an OV with Dr Gwenlyn Found June 5th.  I suggested they cancel the Op DCCV 5/17 and have the patient seen June 5 th before deciding to reschedule.   Kerin Ransom PA-C 02/15/2019 10:26 AM

## 2019-02-17 ENCOUNTER — Encounter (HOSPITAL_COMMUNITY): Admission: RE | Payer: Self-pay | Source: Home / Self Care

## 2019-02-17 ENCOUNTER — Ambulatory Visit (HOSPITAL_COMMUNITY): Admission: RE | Admit: 2019-02-17 | Payer: Medicare Other | Source: Home / Self Care | Admitting: Cardiovascular Disease

## 2019-02-17 SURGERY — CARDIOVERSION
Anesthesia: General

## 2019-03-01 ENCOUNTER — Ambulatory Visit: Payer: Medicare Other | Admitting: Cardiology

## 2019-03-11 ENCOUNTER — Ambulatory Visit: Payer: Medicare Other | Admitting: Cardiovascular Disease

## 2019-03-11 ENCOUNTER — Other Ambulatory Visit: Payer: Self-pay

## 2019-03-11 ENCOUNTER — Encounter: Payer: Self-pay | Admitting: Cardiovascular Disease

## 2019-03-11 DIAGNOSIS — I10 Essential (primary) hypertension: Secondary | ICD-10-CM

## 2019-03-11 DIAGNOSIS — E782 Mixed hyperlipidemia: Secondary | ICD-10-CM | POA: Diagnosis not present

## 2019-03-11 DIAGNOSIS — I4811 Longstanding persistent atrial fibrillation: Secondary | ICD-10-CM

## 2019-03-11 NOTE — Assessment & Plan Note (Addendum)
History of persistent atrial fibrillation rate controlled on Xarelto oral anticoagulation.  He has taken this uninterrupted for the last several months.  He was recently put on amiodarone 200 mg a day.  I am going to arrange for him to undergo outpatient DC cardioversion in the next several weeks.

## 2019-03-11 NOTE — Progress Notes (Signed)
03/11/2019 Christian Harrison   September 03, 1946  [id number]  Primary Physician Christian Downing, MD Primary Cardiologist: Christian Harp MD Christian Harrison, [place]  HPI:  Christian Harrison is a 73 y.o.  thin appearing married Caucasian male father of 2 stepchildren, grandfather of 65 stepgrandchildren referred by Christian Harrison for cardiovascular dilation because of newly recognized atrial fibrillation.  He is accompanied by his wife Christian Harrison today.  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  He was seen by Dr. Golden Harrison in consultation 09/13/2011 in the setting of A. fib with RVR during a hospitalization for hernia repair.  He is risk factors include treated hypertension and hyperlipidemia.  He is never had a heart attack or stroke.  He denies chest pain or shortness of breath.  There is no family history for heart disease.  He was recently seen by his PCP who noted that he was in A. fib rate controlled.  We will arrange for him to undergo outpatient cardioversion however he had missed a dose of his Xarelto and this was postponed.  He was admitted to the hospital back in April for COPD exacerbation and had attempted cardioversion which was unsuccessful.  He was begun on amiodarone.  A 2D echocardiogram performed 11/03/2018 revealed an EF of 40 to 45% with a mildly dilated left atrium.  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  He complains of some swelling in his feet.   Current Meds  Medication Sig  . acetaminophen (TYLENOL) 325 MG tablet Take 325 mg by mouth every 6 (six) hours as needed for headache.  Marland Kitchen amiodarone (PACERONE) 200 MG tablet Take 200 mg by mouth daily.  . carvedilol (COREG) 12.5 MG tablet Take 1 tablet by mouth daily.  . citalopram (CELEXA) 40 MG tablet Take 20 mg by mouth every evening.   Marland Kitchen ipratropium-albuterol (DUONEB) 0.5-2.5 (3) MG/3ML SOLN USE ONE VIAL IN THE NEBULIZER EVERY 4-6 HOURS IF NEEDED FOR COUGH OR WHEEZE (Patient taking differently: Inhale 3 mLs into the  lungs 2 (two) times a day. )  . lovastatin (MEVACOR) 20 MG tablet Take 20 mg by mouth every evening.   . metoprolol tartrate (LOPRESSOR) 50 MG tablet Take 1 tablet (50 mg total) by mouth 2 (two) times daily.  . Multiple Vitamins-Minerals (MULTIVITAMINS THER. W/MINERALS) TABS Take 1 tablet by mouth every evening.   . rivaroxaban (XARELTO) 20 MG TABS tablet Take 1 tablet (20 mg total) by mouth daily with supper. (Patient taking differently: Take 20 mg by mouth every evening. )  . traZODone (DESYREL) 150 MG tablet Take 150 mg by mouth at bedtime.   . vitamin A 8000 UNIT capsule Take 8,000 Units by mouth every evening.   . vitamin E 400 UNIT capsule Take 400 Units by mouth every evening.      Allergies  Allergen Reactions  . Penicillins Anaphylaxis and Other (See Comments)    Seizures Did it involve swelling of the face/tongue/throat, SOB, or low BP? Yes Did it involve sudden or severe rash/hives, skin peeling, or any reaction on the inside of your mouth or nose? No Did you need to seek medical attention at a hospital or doctor's office? Yes When did it last happen?teenager If all above answers are "NO", may proceed with cephalosporin use.     Social History   Socioeconomic History  . Marital status: Married    Spouse name: Not on file  . Number of children: Not on  file  . Years of education: Not on file  . Highest education level: Not on file  Occupational History  . Not on file  Social Needs  . Financial resource strain: Not on file  . Food insecurity:    Worry: Not on file    Inability: Not on file  . Transportation needs:    Medical: Not on file    Non-medical: Not on file  Tobacco Use  . Smoking status: Former Smoker    Years: 1.00    Last attempt to quit: 09/18/1970    Years since quitting: 48.5  . Smokeless tobacco: Never Used  Substance and Sexual Activity  . Alcohol use: No  . Drug use: No  . Sexual activity: Yes    Birth control/protection: None   Lifestyle  . Physical activity:    Days per week: Not on file    Minutes per session: Not on file  . Stress: Not on file  Relationships  . Social connections:    Talks on phone: Not on file    Gets together: Not on file    Attends religious service: Not on file    Active member of club or organization: Not on file    Attends meetings of clubs or organizations: Not on file    Relationship status: Not on file  . Intimate partner violence:    Fear of current or ex partner: Not on file    Emotionally abused: Not on file    Physically abused: Not on file    Forced sexual activity: Not on file  Other Topics Concern  . Not on file  Social History Narrative  . Not on file     Review of Systems: General: negative for chills, fever, night sweats or weight changes.  Cardiovascular: negative for chest pain, dyspnea on exertion, edema, orthopnea, palpitations, paroxysmal nocturnal dyspnea or shortness of breath Dermatological: negative for rash Respiratory: negative for cough or wheezing Urologic: negative for hematuria Abdominal: negative for nausea, vomiting, diarrhea, bright red blood per rectum, melena, or hematemesis Neurologic: negative for visual changes, syncope, or dizziness All other systems reviewed and are otherwise negative except as noted above.    Blood pressure (!) 170/77, pulse 87, temperature 97.7 F (36.5 C), height 5\' 10"  (1.778 m), weight 212 lb 3.2 oz (96.3 kg), SpO2 97 %.  General appearance: alert and no distress Neck: no adenopathy, no carotid bruit, no JVD, supple, symmetrical, trachea midline and thyroid not enlarged, symmetric, no tenderness/mass/nodules Lungs: clear to auscultation bilaterally Heart: irregularly irregular rhythm Extremities: Trace lower extremity edema Pulses: 2+ and symmetric Skin: Skin color, texture, turgor normal. No rashes or lesions Neurologic: Alert and oriented X 3, normal strength and tone. Normal symmetric reflexes. Normal  coordination and gait  EKG atrial fibrillation with a ventricular spots of 104 nonspecific ST and T wave changes.  I personally reviewed this EKG  ASSESSMENT AND PLAN:   Hypertension History of essential hypertension with blood pressure measured at 170/77.  He is on metoprolol.  He takes his blood pressure at home and normally it is in the normal range.  Atrial fibrillation ([place]) History of persistent atrial fibrillation rate controlled on Xarelto oral anticoagulation.  He has taken this uninterrupted for the last several months.  He was recently put on amiodarone 200 mg a day.  I am going to arrange for him to undergo outpatient DC cardioversion in the next several weeks.  Hyperlipidemia History of hyperlipidemia on lovastatin followed by his PCP  Christian Harp MD FACP,FACC,FAHA, [facility] 03/11/2019 3:19 PM

## 2019-03-11 NOTE — Assessment & Plan Note (Signed)
History of hyperlipidemia on lovastatin followed by his PCP 

## 2019-03-11 NOTE — Assessment & Plan Note (Signed)
History of essential hypertension with blood pressure measured at 170/77.  He is on metoprolol.  He takes his blood pressure at home and normally it is in the normal range.

## 2019-03-11 NOTE — H&P (View-Only) (Signed)
03/11/2019 [name]   1946-09-27  [id number]  Primary Physician Christian Downing, MD Primary Cardiologist: Christian Harp MD Christian Harrison, [place]  HPI:  Christian Harrison is a 73 y.o.  thin appearing married Caucasian male father of 2 stepchildren, grandfather of 39 stepgrandchildren referred by Christian Harrison for cardiovascular dilation because of newly recognized atrial fibrillation.  He is accompanied by his wife Christian Harrison today.  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  He was seen by Christian Harrison in consultation 09/13/2011 in the setting of A. fib with RVR during a hospitalization for hernia repair.  He is risk factors include treated hypertension and hyperlipidemia.  He is never had a heart attack or stroke.  He denies chest pain or shortness of breath.  There is no family history for heart disease.  He was recently seen by his PCP who noted that he was in A. fib rate controlled.  We will arrange for him to undergo outpatient cardioversion however he had missed a dose of his Xarelto and this was postponed.  He was admitted to the hospital back in April for COPD exacerbation and had attempted cardioversion which was unsuccessful.  He was begun on amiodarone.  A 2D echocardiogram performed 11/03/2018 revealed an EF of 40 to 45% with a mildly dilated left atrium.  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  He complains of some swelling in his feet.   Current Meds  Medication Sig  . acetaminophen (TYLENOL) 325 MG tablet Take 325 mg by mouth every 6 (six) hours as needed for headache.  Marland Kitchen amiodarone (PACERONE) 200 MG tablet Take 200 mg by mouth daily.  . carvedilol (COREG) 12.5 MG tablet Take 1 tablet by mouth daily.  . citalopram (CELEXA) 40 MG tablet Take 20 mg by mouth every evening.   Marland Kitchen ipratropium-albuterol (DUONEB) 0.5-2.5 (3) MG/3ML SOLN USE ONE VIAL IN THE NEBULIZER EVERY 4-6 HOURS IF NEEDED FOR COUGH OR WHEEZE (Patient taking differently: Inhale 3 mLs into the  lungs 2 (two) times a day. )  . lovastatin (MEVACOR) 20 MG tablet Take 20 mg by mouth every evening.   . metoprolol tartrate (LOPRESSOR) 50 MG tablet Take 1 tablet (50 mg total) by mouth 2 (two) times daily.  . Multiple Vitamins-Minerals (MULTIVITAMINS THER. W/MINERALS) TABS Take 1 tablet by mouth every evening.   . rivaroxaban (XARELTO) 20 MG TABS tablet Take 1 tablet (20 mg total) by mouth daily with supper. (Patient taking differently: Take 20 mg by mouth every evening. )  . traZODone (DESYREL) 150 MG tablet Take 150 mg by mouth at bedtime.   . vitamin A 8000 UNIT capsule Take 8,000 Units by mouth every evening.   . vitamin E 400 UNIT capsule Take 400 Units by mouth every evening.      Allergies  Allergen Reactions  . Penicillins Anaphylaxis and Other (See Comments)    Seizures Did it involve swelling of the face/tongue/throat, SOB, or low BP? Yes Did it involve sudden or severe rash/hives, skin peeling, or any reaction on the inside of your mouth or nose? No Did you need to seek medical attention at a hospital or doctor's office? Yes When did it last happen?teenager If all above answers are "NO", may proceed with cephalosporin use.     Social History   Socioeconomic History  . Marital status: Married    Spouse name: Not on file  . Number of children: Not on  file  . Years of education: Not on file  . Highest education level: Not on file  Occupational History  . Not on file  Social Needs  . Financial resource strain: Not on file  . Food insecurity:    Worry: Not on file    Inability: Not on file  . Transportation needs:    Medical: Not on file    Non-medical: Not on file  Tobacco Use  . Smoking status: Former Smoker    Years: 1.00    Last attempt to quit: 09/18/1970    Years since quitting: 48.5  . Smokeless tobacco: Never Used  Substance and Sexual Activity  . Alcohol use: No  . Drug use: No  . Sexual activity: Yes    Birth control/protection: None   Lifestyle  . Physical activity:    Days per week: Not on file    Minutes per session: Not on file  . Stress: Not on file  Relationships  . Social connections:    Talks on phone: Not on file    Gets together: Not on file    Attends religious service: Not on file    Active member of club or organization: Not on file    Attends meetings of clubs or organizations: Not on file    Relationship status: Not on file  . Intimate partner violence:    Fear of current or ex partner: Not on file    Emotionally abused: Not on file    Physically abused: Not on file    Forced sexual activity: Not on file  Other Topics Concern  . Not on file  Social History Narrative  . Not on file     Review of Systems: General: negative for chills, fever, night sweats or weight changes.  Cardiovascular: negative for chest pain, dyspnea on exertion, edema, orthopnea, palpitations, paroxysmal nocturnal dyspnea or shortness of breath Dermatological: negative for rash Respiratory: negative for cough or wheezing Urologic: negative for hematuria Abdominal: negative for nausea, vomiting, diarrhea, bright red blood per rectum, melena, or hematemesis Neurologic: negative for visual changes, syncope, or dizziness All other systems reviewed and are otherwise negative except as noted above.    Blood pressure (!) 170/77, pulse 87, temperature 97.7 F (36.5 C), height 5\' 10"  (1.778 m), weight 212 lb 3.2 oz (96.3 kg), SpO2 97 %.  General appearance: alert and no distress Neck: no adenopathy, no carotid bruit, no JVD, supple, symmetrical, trachea midline and thyroid not enlarged, symmetric, no tenderness/mass/nodules Lungs: clear to auscultation bilaterally Heart: irregularly irregular rhythm Extremities: Trace lower extremity edema Pulses: 2+ and symmetric Skin: Skin color, texture, turgor normal. No rashes or lesions Neurologic: Alert and oriented X 3, normal strength and tone. Normal symmetric reflexes. Normal  coordination and gait  EKG atrial fibrillation with a ventricular spots of 104 nonspecific ST and T wave changes.  I personally reviewed this EKG  ASSESSMENT AND PLAN:   Hypertension History of essential hypertension with blood pressure measured at 170/77.  He is on metoprolol.  He takes his blood pressure at home and normally it is in the normal range.  Atrial fibrillation ([place]) History of persistent atrial fibrillation rate controlled on Xarelto oral anticoagulation.  He has taken this uninterrupted for the last several months.  He was recently put on amiodarone 200 mg a day.  I am going to arrange for him to undergo outpatient DC cardioversion in the next several weeks.  Hyperlipidemia History of hyperlipidemia on lovastatin followed by his PCP  Christian Harp MD FACP,FACC,FAHA, [facility] 03/11/2019 3:19 PM

## 2019-03-11 NOTE — Patient Instructions (Addendum)
Dear Christian Harrison, You are scheduled for a Cardioversion on March 17, 2019 with Dr. Nolon Stalls.  Please arrive at the [facility] (Main Entrance A) at [facility]: [address] [place], [place] [address] at 9:45 am. (1 hour prior to procedure unless lab work is needed; if lab work is needed arrive 1.5 hours ahead)  DIET: Nothing to eat or drink after midnight except a sip of water with medications (see medication instructions below)  Medication Instructions:  Continue your anticoagulant: Xarelto You will need to continue your anticoagulant after your procedure until you are told by your Provider that it is safe to stop   Labs: For patients receiving anesthesia for TEE and all Cardioversion patients: BMET, CBC within 1 week. You will also need a COVID-19 test.  Go to this location FIRST on Monday June 8th for lab work: [facility] at [employer] [address], [place], [place] [address] [place] [address] [place]. NO APPOINTMENT IS NEEDED. YOU MUST HAVE THIS LAB WORK DONE BEFORE GOING TO [place] COVID-19 TEST BECAUSE YOU WILL NEED TO QUARANTINE YOURSELF AFTER THE COVID-19 TEST UNTIL YOU RECEIVE YOUR RESULTS (NEGATIVE RESULTS).  Go to this location on Monday June 8th at  11:20am: [facility]  [address] [place], [place] [address] FOR YOUR COVID-19 TEST. YOU WILL ALSO NEED TO QUARANTINE YOURSELF AFTER THE COVID-19 TEST UNTIL YOU RECEIVE YOUR RESULTS (NEGATIVE RESULTS). YOU SHOULD RECEIVE YOUR RESULTS IN 48 HOURS OR LESS.   You must have a responsible person to drive you home and stay in the waiting area during your procedure. Failure to do so could result in cancellation.  Bring your insurance cards.  *Special Note: Every effort is made to have your procedure done on time. Occasionally there are emergencies that occur at the hospital that may cause delays. Please be patient if a delay does  occur.

## 2019-03-14 ENCOUNTER — Other Ambulatory Visit (HOSPITAL_COMMUNITY)
Admission: RE | Admit: 2019-03-14 | Discharge: 2019-03-14 | Disposition: A | Discharge status: Home / Self Care | Payer: Medicare Other | Source: Ambulatory Visit | Attending: Internal Medicine | Admitting: Internal Medicine

## 2019-03-14 DIAGNOSIS — Z1159 Encounter for screening for other viral diseases: Secondary | ICD-10-CM | POA: Diagnosis present

## 2019-03-14 LAB — BASIC METABOLIC PANEL
BUN/Creatinine Ratio: 21 (ref 10–24)
BUN: 21 mg/dL (ref 8–27)
CO2: 23 mmol/L (ref 20–29)
Calcium: 9.3 mg/dL (ref 8.6–10.2)
Chloride: 104 mmol/L (ref 96–106)
Creatinine, Ser: 0.99 mg/dL (ref 0.76–1.27)
GFR calc Af Amer: 87 mL/min/{1.73_m2} (ref >59)
GFR calc non Af Amer: 75 mL/min/{1.73_m2} (ref >59)
Glucose: 138 mg/dL — ABNORMAL HIGH (ref 65–99)
Potassium: 4.2 mmol/L (ref 3.5–5.2)
Sodium: 142 mmol/L (ref 134–144)

## 2019-03-14 LAB — CBC
Hematocrit: 42.3 % (ref 37.5–51.0)
Hemoglobin: 14.3 g/dL (ref 13.0–17.7)
MCH: 31.7 pg (ref 26.6–33.0)
MCHC: 33.8 g/dL (ref 31.5–35.7)
MCV: 94 fL (ref 79–97)
Platelets: 245 10*3/uL (ref 150–450)
RBC: 4.51 x10E6/uL (ref 4.14–5.80)
RDW: 12.6 % (ref 11.6–15.4)
WBC: 9.2 10*3/uL (ref 3.4–10.8)

## 2019-03-15 LAB — NOVEL CORONAVIRUS, NAA (HOSP ORDER, SEND-OUT TO REF LAB; TAT 18-24 HRS): SARS-CoV-2, NAA: NOT DETECTED

## 2019-03-16 ENCOUNTER — Telehealth: Payer: Self-pay | Admitting: Cardiovascular Disease

## 2019-03-16 NOTE — Telephone Encounter (Signed)
Notified wife that there are no specific instructions on if an adult needs to stay with patient for any specified amount of time post-cardioversion

## 2019-03-16 NOTE — Telephone Encounter (Signed)
New Message:     Wife says pt is having a Cardioversion tomorrow. Her question is after pt gets home, will she need to stay with him constantly?

## 2019-03-16 NOTE — Telephone Encounter (Signed)
Left message to call back  

## 2019-03-16 NOTE — Telephone Encounter (Signed)
Follow up   Patient is retuning your call. Please call.

## 2019-03-17 ENCOUNTER — Encounter (HOSPITAL_COMMUNITY): Payer: Self-pay | Admitting: Certified Registered Nurse Anesthetist

## 2019-03-17 ENCOUNTER — Ambulatory Visit (HOSPITAL_COMMUNITY)
Admission: RE | Admit: 2019-03-17 | Discharge: 2019-03-17 | Disposition: A | Discharge status: Home / Self Care | Payer: Medicare Other | Attending: Internal Medicine | Admitting: Internal Medicine

## 2019-03-17 ENCOUNTER — Encounter (HOSPITAL_COMMUNITY)
Admission: RE | Disposition: A | Discharge status: Home / Self Care | Payer: Medicare Other | Source: Home / Self Care | Attending: Internal Medicine

## 2019-03-17 ENCOUNTER — Ambulatory Visit (HOSPITAL_COMMUNITY): Payer: Medicare Other | Admitting: Anesthesiology

## 2019-03-17 DIAGNOSIS — Z79899 Other long term (current) drug therapy: Secondary | ICD-10-CM | POA: Insufficient documentation

## 2019-03-17 DIAGNOSIS — Z7901 Long term (current) use of anticoagulants: Secondary | ICD-10-CM | POA: Diagnosis not present

## 2019-03-17 DIAGNOSIS — E785 Hyperlipidemia, unspecified: Secondary | ICD-10-CM | POA: Insufficient documentation

## 2019-03-17 DIAGNOSIS — J441 Chronic obstructive pulmonary disease with (acute) exacerbation: Secondary | ICD-10-CM | POA: Diagnosis not present

## 2019-03-17 DIAGNOSIS — Z87891 Personal history of nicotine dependence: Secondary | ICD-10-CM | POA: Insufficient documentation

## 2019-03-17 DIAGNOSIS — I4891 Unspecified atrial fibrillation: Secondary | ICD-10-CM

## 2019-03-17 DIAGNOSIS — I4819 Other persistent atrial fibrillation: Secondary | ICD-10-CM | POA: Insufficient documentation

## 2019-03-17 DIAGNOSIS — I10 Essential (primary) hypertension: Secondary | ICD-10-CM | POA: Insufficient documentation

## 2019-03-17 DIAGNOSIS — Z880 Allergy status to penicillin: Secondary | ICD-10-CM | POA: Diagnosis not present

## 2019-03-17 SURGERY — CANCELLED PROCEDURE

## 2019-03-17 NOTE — Progress Notes (Signed)
Pt's procedure cancelled due to normal sinus rhythm.

## 2019-03-17 NOTE — Anesthesia Preprocedure Evaluation (Signed)
Anesthesia Evaluation    Reviewed: Allergy & Precautions, Patient's Chart, lab work & pertinent test results  Airway        Dental   Pulmonary asthma , COPD,  COPD inhaler, former smoker,           Cardiovascular hypertension, Pt. on home beta blockers + dysrhythmias Atrial Fibrillation      Neuro/Psych    GI/Hepatic Neg liver ROS, hiatal hernia, GERD  ,  Endo/Other  negative endocrine ROS  Renal/GU negative Renal ROS     Musculoskeletal negative musculoskeletal ROS (+)   Abdominal   Peds  Hematology negative hematology ROS (+)   Anesthesia Other Findings   Reproductive/Obstetrics                             Lab Results  Component Value Date   WBC 9.2 03/14/2019   HGB 14.3 03/14/2019   HCT 42.3 03/14/2019   MCV 94 03/14/2019   PLT 245 03/14/2019   Lab Results  Component Value Date   CREATININE 0.99 03/14/2019   BUN 21 03/14/2019   NA 142 03/14/2019   K 4.2 03/14/2019   CL 104 03/14/2019   CO2 23 03/14/2019   Lab Results  Component Value Date   INR 1.4 (H) 12/26/2018   INR 1.05 04/22/2010   INR 1.09 03/28/2010     Anesthesia Physical Anesthesia Plan  ASA: III  Anesthesia Plan: General   Post-op Pain Management:    Induction: Intravenous  PONV Risk Score and Plan: 0  Airway Management Planned: Mask and Natural Airway  Additional Equipment: None  Intra-op Plan:   Post-operative Plan:   Informed Consent:   Plan Discussed with: CRNA  Anesthesia Plan Comments:         Anesthesia Quick Evaluation

## 2019-03-17 NOTE — Progress Notes (Signed)
Patient in sinus rhythm, DCCV cancelled. Discussed with patient.   Will notify primary cardiologist for follow up.   Christian Harrison 03/17/19

## 2019-03-17 NOTE — Interval H&P Note (Signed)
History and Physical Interval Note:  03/17/2019 9:58 AM  Christian Harrison  has presented today for surgery, with the diagnosis of AFIB.  The various methods of treatment have been discussed with the patient and family. After consideration of risks, benefits and other options for treatment, the patient has consented to  Procedure(s): CARDIOVERSION (N/A) as a surgical intervention.  The patient's history has been reviewed, patient examined, no change in status, stable for surgery.  I have reviewed the patient's chart and labs.  Questions were answered to the patient's satisfaction.     [name]

## 2019-04-11 ENCOUNTER — Other Ambulatory Visit: Payer: Self-pay | Admitting: Internal Medicine

## 2019-04-12 ENCOUNTER — Other Ambulatory Visit: Payer: Self-pay | Admitting: Internal Medicine

## 2019-04-20 ENCOUNTER — Telehealth: Payer: Self-pay | Admitting: *Deleted

## 2019-04-20 NOTE — Telephone Encounter (Signed)
A message was left, re: follow up visit. 

## 2019-04-23 ENCOUNTER — Other Ambulatory Visit: Payer: Self-pay | Admitting: Internal Medicine

## 2019-05-16 NOTE — Telephone Encounter (Signed)
This encounter was created in error - please disregard.

## 2019-06-04 ENCOUNTER — Other Ambulatory Visit: Payer: Self-pay | Admitting: Cardiology

## 2019-06-07 MED ORDER — DILTIAZEM HCL ER BEADS 120 MG PO CP24: 120.0000 mg | ORAL_CAPSULE | Freq: Every day | ORAL | 2 refills | Status: DC

## 2019-06-22 ENCOUNTER — Other Ambulatory Visit: Payer: Self-pay | Admitting: Cardiovascular Disease

## 2019-07-08 ENCOUNTER — Ambulatory Visit: Payer: Medicare Other | Admitting: Cardiovascular Disease

## 2019-07-30 ENCOUNTER — Other Ambulatory Visit: Payer: Self-pay | Admitting: Internal Medicine

## 2019-08-02 ENCOUNTER — Ambulatory Visit: Payer: Medicare Other | Admitting: Cardiovascular Disease

## 2019-08-26 ENCOUNTER — Other Ambulatory Visit: Payer: Self-pay

## 2019-08-26 DIAGNOSIS — Z20822 Contact with and (suspected) exposure to covid-19: Secondary | ICD-10-CM

## 2019-08-29 LAB — NOVEL CORONAVIRUS, NAA: SARS-CoV-2, NAA: NOT DETECTED

## 2019-08-30 ENCOUNTER — Ambulatory Visit: Payer: Medicare Other | Admitting: Cardiovascular Disease

## 2019-09-09 ENCOUNTER — Other Ambulatory Visit: Payer: Self-pay | Admitting: Internal Medicine

## 2019-09-12 ENCOUNTER — Other Ambulatory Visit: Payer: Self-pay | Admitting: Internal Medicine

## 2019-09-15 ENCOUNTER — Other Ambulatory Visit: Payer: Self-pay | Admitting: Internal Medicine

## 2019-10-06 ENCOUNTER — Telehealth: Payer: Self-pay

## 2019-10-06 NOTE — Telephone Encounter (Signed)
Will route to pharmd pool

## 2019-10-06 NOTE — Telephone Encounter (Signed)
Pt wife called stating that xarelto too expensive. Asking if there is another medication that can be prescribed that is affordable. Will forward to pharmacy for review.

## 2019-10-12 ENCOUNTER — Telehealth: Payer: Self-pay | Admitting: Cardiovascular Disease

## 2019-10-12 NOTE — Telephone Encounter (Signed)
Duplicate. Please see telephone note from 12/31

## 2019-10-12 NOTE — Telephone Encounter (Signed)
New Message ° ° ° °Pts wife is returning a call  ° °Please call back  °

## 2019-10-13 NOTE — Telephone Encounter (Signed)
  Wife is calling back

## 2019-10-13 NOTE — Telephone Encounter (Signed)
Spoke with wife. Explained options for anticoagulation - Xarelto, Eliquis or warfarin.  Pt has $50 deductible an $47 monthly for DOAC.  Reviewed need for regular INR checks and associated fees associated with warfarin dosing.  Offered information on Eliquis patient assistance should they spend > 3% of income on medications (combined)  Wife does not feel they would reach that point.    Will stay on Xarelto for now, unfortunately no samples currently available, but advised her to call when have need.

## 2019-11-12 ENCOUNTER — Other Ambulatory Visit: Payer: Self-pay | Admitting: Cardiology

## 2019-12-12 ENCOUNTER — Ambulatory Visit
Admission: EM | Admit: 2019-12-12 | Discharge: 2019-12-12 | Disposition: A | Discharge status: Home / Self Care | Payer: Medicare Other

## 2019-12-12 DIAGNOSIS — R1032 Left lower quadrant pain: Secondary | ICD-10-CM

## 2019-12-12 NOTE — Discharge Instructions (Signed)
Take antibiotics as prescribed and discussed at length. Do not eat full meals with this, just a few bites. Follow-up with primary care tomorrow regarding pain. If no significant improvement in pain in the next 24 hours, or you develop fever, blood or black in stool you need to go to the emergency room.

## 2019-12-12 NOTE — ED Provider Notes (Signed)
[facility]    CSN: [id number] Arrival date & time: 12/12/19  1946      History   Chief Complaint Chief Complaint  Patient presents with  . Abdominal Pain    HPI Christian Harrison is a 74 y.o. male with history of colon cancer, asthma, COPD, persistent atrial fibrillation (on Xarelto), hypertension presenting for left lower quadrant pain.  States this has been ongoing since last Tuesday during which time he had diarrhea and loose stools.  States last bowel movement was at 5 PM: No blood or melena.  Denies missed doses with Xarelto.  States he saw his PCP on Friday for similar concern: Had labs done.  "They said I had elevated white blood cells and were gonna treat me for diverticulitis ".  States that they were "just called into the pharmacy today ".  Has not yet taken them as he is not sure they will help with pain.  Patient denying fever, chills, neuralgias, myalgias, chest pain, difficulty breathing, lower extremity edema.  Denies abdominal distention, nausea, vomiting.  No steatorrhea, mucus or pus in stool.     Past Medical History:  Diagnosis Date  . Acid reflux   . Asthma 09-08-11   controlled with inhalers  . Colon cancer ([place]) 09-08-11   colon ca dx 02/2010. Chemo x 12 sessions  . Complication of anesthesia    ? pneumonia after surgery in 2011  . COPD (chronic obstructive pulmonary disease) ([place]) 09-08-11   previous smoker  . Dysrhythmia 03/2010   PAF after colon resection in the setting of partial SBO -  . Hernia   . Hiatal hernia   . Hypertension   . Incisional hernia s/p open [facility] repair w mesh [id number] 09/13/2011    Patient Active Problem List   Diagnosis Date Noted  . Atrial fibrillation with RVR ([place]) 01/30/2019  . Persistent atrial fibrillation with RVR ([place])   . Chest pain   . Cough   . SOB (shortness of breath)   . Long term (current) use of anticoagulants 12/22/2018  . COPD with acute exacerbation ([place]) 12/22/2018  . Cardiomyopathy ([place]) 12/22/2018   . Hyperlipidemia 10/12/2018  . Diarrhea 03/03/2013  . History of colon cancer, stage III 11/22/2012  . SBO (small bowel obstruction) ([place]) 09/19/2011  . Asthma 09/16/2011  . Hypertension 09/16/2011  . Incisional hernia s/p open [place] repair w mesh [id number] 09/13/2011  . Atrial fibrillation ([place]) 09/13/2011    Past Surgical History:  Procedure Laterality Date  . COLON SURGERY  09-08-11   surgery in 6'11  . DG FINGER*L*  09-08-11   left index finger-tendon release  . HAND SURGERY     LEFT  . PORT-A-CATH REMOVAL  09/10/2011   Procedure: REMOVAL PORT-A-CATH;  Surgeon: Adin Hector, MD;  Location: [facility];  Service: General;  Laterality: N/A;  Removal Port-a-Cath  . VENTRAL HERNIA REPAIR  09/10/2011   Procedure: LAPAROSCOPIC VENTRAL HERNIA;  Surgeon: Adin Hector, MD;  Location: [facility];  Service: General;  Laterality: N/A;  Lysis of Adhessions, Converted to open, with Physio Mesh       Home Medications    Prior to Admission medications   Medication Sig Start Date End Date Taking? Authorizing Provider  amiodarone (PACERONE) 200 MG tablet Take 200 mg by mouth daily.    [provider]  citalopram (CELEXA) 40 MG tablet Take 20 mg by mouth every evening.     [provider]  diltiazem (TIAZAC) 120 MG 24 hr  capsule Take 1 capsule (120 mg total) by mouth daily. 06/07/19   Kilroy, Doreene Burke, PA-C  ipratropium-albuterol (DUONEB) 0.5-2.5 (3) MG/3ML SOLN USE ONE VIAL IN THE NEBULIZER EVERY 4-6 HOURS IF NEEDED FOR COUGH OR WHEEZE Patient taking differently: Inhale 3 mLs into the lungs every 4 (four) hours as needed (cough or wheezing).  08/26/18   Kozlow, Donnamarie Poag, MD  lovastatin (MEVACOR) 20 MG tablet Take 20 mg by mouth every evening.  05/02/17   [provider]  metoprolol tartrate (LOPRESSOR) 50 MG tablet Take 1 tablet (50 mg total) by mouth 2 (two) times daily. 01/31/19   Seawell, Jaimie A, DO  Multiple Vitamins-Minerals (MULTIVITAMINS THER. W/MINERALS) TABS Take 1  tablet by mouth every evening.     [provider]  rivaroxaban (XARELTO) 20 MG TABS tablet Take 1 tablet (20 mg total) by mouth daily with supper. 06/22/19   Lorretta Harp, MD  traZODone (DESYREL) 150 MG tablet Take 150 mg by mouth at bedtime as needed for sleep.     [provider]  vitamin A 8000 UNIT capsule Take 8,000 Units by mouth daily.     [provider]  vitamin E 400 UNIT capsule Take 400 Units by mouth daily.     [provider]    Family History Family History  Problem Relation Age of Onset  . Heart disease Mother   . Allergic rhinitis Neg Hx   . Asthma Neg Hx     Social History Social History   Tobacco Use  . Smoking status: Former Smoker    Years: 1.00    Quit date: 09/18/1970    Years since quitting: 49.2  . Smokeless tobacco: Never Used  Substance Use Topics  . Alcohol use: No  . Drug use: No     Allergies   Penicillins   Review of Systems As per HPI   Physical Exam Triage Vital Signs ED Triage Vitals  Enc Vitals Group     BP      Pulse      Resp      Temp      Temp src      SpO2      Weight      Height      Head Circumference      Peak Flow      Pain Score      Pain Loc      Pain Edu?      Excl. in [place]?    No data found.  Updated Vital Signs BP (!) 178/91 (BP Location: Left Arm)   Pulse (!) 106   Temp 98.8 F (37.1 C) (Oral)   Resp 18   SpO2 95%   Visual Acuity Right Eye Distance:   Left Eye Distance:   Bilateral Distance:    Right Eye Near:   Left Eye Near:    Bilateral Near:     Physical Exam Constitutional:      General: He is not in acute distress.    Appearance: He is obese. He is not toxic-appearing or diaphoretic.  HENT:     Head: Normocephalic and atraumatic.  Eyes:     General: No scleral icterus.    Pupils: Pupils are equal, round, and reactive to light.  Cardiovascular:     Rate and Rhythm: Normal rate. Rhythm irregular.     Comments: Manual HR 94 at  bedside Pulmonary:     Effort: Pulmonary effort is normal. No respiratory distress.  Breath sounds: No wheezing.  Abdominal:     General: Abdomen is protuberant. Bowel sounds are normal.     Palpations: Abdomen is soft. There is no hepatomegaly, splenomegaly or pulsatile mass.     Tenderness: There is abdominal tenderness in the left lower quadrant. There is no right CVA tenderness, left CVA tenderness, guarding or rebound. Negative signs include Murphy's sign, Rovsing's sign and McBurney's sign.     Hernia: No hernia is present.  Skin:    General: Skin is warm.     Capillary Refill: Capillary refill takes less than 2 seconds.     Coloration: Skin is not cyanotic, jaundiced, mottled or pale.  Neurological:     Mental Status: He is alert and oriented to person, place, and time.      UC Treatments / Results  Labs (all labs ordered are listed, but only abnormal results are displayed) Labs Reviewed - No data to display  EKG   Radiology No results found.  Procedures Procedures (including critical care time)  Medications Ordered in UC Medications - No data to display  Initial Impression / Assessment and Plan / UC Course  I have reviewed the triage vital signs and the nursing notes.  Pertinent labs & imaging results that were available during my care of the patient were reviewed by me and considered in my medical decision making (see chart for details).     Patient afebrile, nontoxic in office today.  Patient has not yet started Bactrim, Flagyl that was prescribed by PCP earlier today for suspected diverticulitis.  Recommended the patient start taking antibiotic today, have low threshold to go to ER for further evaluation if pain is persistent, or he develops fever, weakness, blood in stool.  Return precautions discussed, patient verbalized understanding and is agreeable to plan. Final Clinical Impressions(s) / UC Diagnoses   Final diagnoses:  LLQ pain     Discharge  Instructions     Take antibiotics as prescribed and discussed at length. Do not eat full meals with this, just a few bites. Follow-up with primary care tomorrow regarding pain. If no significant improvement in pain in the next 24 hours, or you develop fever, blood or black in stool you need to go to the emergency room.    ED Prescriptions    None     PDMP not reviewed this encounter.   Hall-Potvin, [place], [place] 12/12/19 2009

## 2019-12-12 NOTE — ED Triage Notes (Signed)
Pt c/o LLQ pain since last Tuesday with diarrhea. States saw his PCP on Friday and treated with antibiotics that he hasn't started yet. Denies n/v

## 2020-01-13 ENCOUNTER — Telehealth: Payer: Self-pay | Admitting: *Deleted

## 2020-01-13 NOTE — Telephone Encounter (Signed)
   [facility] Pre-operative Risk Assessment    Request for surgical clearance:  1. What type of surgery is being performed? Colonoscopy/Endoscopy   2. When is this surgery scheduled? 01/31/20   3. What type of clearance is required (medical clearance vs. Pharmacy clearance to hold med vs. Both)? Both  4. Are there any medications that need to be held prior to surgery and how long? Xarelto   5. Practice name and name of physician performing surgery? [facility], Dr. Penelope Coop   6. What is your office phone number [phone]    7.   What is your office fax number [phone]  8.   Anesthesia type (None, local, MAC, general) ? Not listed   Ricci Barker 01/13/2020, 5:31 PM  _________________________________________________________________   (provider comments below)

## 2020-01-15 ENCOUNTER — Other Ambulatory Visit: Payer: Self-pay | Admitting: Cardiovascular Disease

## 2020-01-16 NOTE — Telephone Encounter (Signed)
   Primary Cardiologist: Quay Burow, MD  Left a voicemail to call back for ongoing preop assessment.   Abigail Butts, PA-C 01/16/2020, 11:26 AM

## 2020-01-16 NOTE — Telephone Encounter (Signed)
Rx request sent to pharmacy.  

## 2020-01-16 NOTE — Telephone Encounter (Signed)
   Primary Cardiologist: Quay Burow, MD  Left a voicemail for patient to call back for ongoing preoperative assessment.  Abigail Butts, PA-C 01/16/2020, 11:24 AM

## 2020-01-16 NOTE — Telephone Encounter (Signed)
Pt takes Xarelto for afib with CHADS2VASc score of 2 (age, HTN). Renal function is normal. Ok to hold Xarelto for 1-2 days prior to procedure.

## 2020-01-19 NOTE — Telephone Encounter (Signed)
Left message to call back and ask to speak with pre-op team. 

## 2020-01-24 NOTE — Telephone Encounter (Signed)
I was able to get in touch with his wife who informed me he was having his colonoscopy today. Will remove from preop pool.

## 2020-02-20 ENCOUNTER — Ambulatory Visit: Payer: Medicare Other | Admitting: General Practice

## 2020-02-29 ENCOUNTER — Ambulatory Visit: Payer: Medicare Other | Admitting: Cardiovascular Disease

## 2020-03-09 ENCOUNTER — Other Ambulatory Visit: Payer: Self-pay

## 2020-03-09 ENCOUNTER — Encounter: Payer: Self-pay | Admitting: Cardiovascular Disease

## 2020-03-09 ENCOUNTER — Ambulatory Visit (INDEPENDENT_AMBULATORY_CARE_PROVIDER_SITE_OTHER): Payer: Medicare Other | Admitting: Cardiovascular Disease

## 2020-03-09 VITALS — BP 146/70 | HR 78 | Ht 70.0 in | Wt 214.0 lb

## 2020-03-09 DIAGNOSIS — I480 Paroxysmal atrial fibrillation: Secondary | ICD-10-CM | POA: Diagnosis not present

## 2020-03-09 DIAGNOSIS — E782 Mixed hyperlipidemia: Secondary | ICD-10-CM

## 2020-03-09 DIAGNOSIS — I10 Essential (primary) hypertension: Secondary | ICD-10-CM | POA: Diagnosis not present

## 2020-03-09 DIAGNOSIS — I4891 Unspecified atrial fibrillation: Secondary | ICD-10-CM | POA: Diagnosis not present

## 2020-03-09 DIAGNOSIS — I429 Cardiomyopathy, unspecified: Secondary | ICD-10-CM

## 2020-03-09 NOTE — Progress Notes (Signed)
03/09/2020 Christian Harrison   Nov 17, 1945  [id number]  Primary Physician Leonard Downing, MD Primary Cardiologist: Lorretta Harp MD [name], [place]  HPI:  Christian Harrison is a 74 y.o.   thin appearing married Caucasian male father of 2 stepchildren, grandfather of 46 stepgrandchildren referred by Dr. Claris Gower for cardiovascular evaluation because of newly recognized atrial fibrillation. He is accompanied by his wife [name] today.  I last saw him in the office 03/11/2019. 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 He was seen by Dr. Golden Hurter in consultation 09/13/2011 in the setting of A. fib with RVR during a hospitalization for hernia repair. He is risk factors include treated hypertension and hyperlipidemia. He is never had a heart attack or stroke. He denies chest pain or shortness of breath. There is no family history for heart disease. He was recently seen by his PCP who noted that he was in A. fib rate controlled.  We will arrange for him to undergo outpatient cardioversion however he had missed a dose of his Xarelto and this was postponed.  He was admitted to the hospital back in April for COPD exacerbation and had attempted cardioversion which was unsuccessful.  He was begun on amiodarone.  A 2D echocardiogram performed 11/03/2018 revealed an EF of 40 to 45% with a mildly dilated left atrium.  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  He complains of some swelling in his feet.  I arrange for him to undergo outpatient cardioversion by Dr. Margaretann Loveless however when he presented to the hospital he was in sinus rhythm and this was canceled.  He remains on Xarelto oral anticoagulation.   Current Meds  Medication Sig  . citalopram (CELEXA) 40 MG tablet Take 20 mg by mouth every evening.   . diltiazem (CARDIZEM CD) 120 MG 24 hr capsule Take 120 mg by mouth daily.  Marland Kitchen ipratropium-albuterol (DUONEB) 0.5-2.5 (3) MG/3ML SOLN USE ONE VIAL IN THE NEBULIZER EVERY 4-6 HOURS  IF NEEDED FOR COUGH OR WHEEZE  . lovastatin (MEVACOR) 20 MG tablet Take 20 mg by mouth every evening.   . metoprolol tartrate (LOPRESSOR) 50 MG tablet Take 1 tablet (50 mg total) by mouth 2 (two) times daily.  . Multiple Vitamins-Minerals (MULTIVITAMINS THER. W/MINERALS) TABS Take 1 tablet by mouth every evening.   . traZODone (DESYREL) 150 MG tablet Take 150 mg by mouth at bedtime as needed for sleep.   . vitamin A 8000 UNIT capsule Take 8,000 Units by mouth daily.   . vitamin E 400 UNIT capsule Take 400 Units by mouth daily.   Alveda Reasons 20 MG TABS tablet TAKE 1 TABLET BY MOUTH ONCE DAILY WITH SUPPER     Allergies  Allergen Reactions  . Penicillins Anaphylaxis and Other (See Comments)    Seizures Did it involve swelling of the face/tongue/throat, SOB, or low BP? Yes Did it involve sudden or severe rash/hives, skin peeling, or any reaction on the inside of your mouth or nose? No Did you need to seek medical attention at a hospital or doctor's office? Yes When did it last happen?teenager If all above answers are "NO", may proceed with cephalosporin use.     Social History   Socioeconomic History  . Marital status: Married    Spouse name: Not on file  . Number of children: Not on file  . Years of education: Not on file  . Highest education level: Not on file  Occupational History  .  Not on file  Tobacco Use  . Smoking status: Former Smoker    Years: 1.00    Quit date: 09/18/1970    Years since quitting: 49.5  . Smokeless tobacco: Never Used  Substance and Sexual Activity  . Alcohol use: No  . Drug use: No  . Sexual activity: Yes    Birth control/protection: None  Other Topics Concern  . Not on file  Social History Narrative  . Not on file   Social Determinants of Health   Financial Resource Strain:   . Difficulty of Paying Living Expenses:   Food Insecurity:   . Worried About Charity fundraiser in the Last Year:   . Arboriculturist in the Last Year:     Transportation Needs:   . Film/video editor (Medical):   Marland Kitchen Lack of Transportation (Non-Medical):   Physical Activity:   . Days of Exercise per Week:   . Minutes of Exercise per Session:   Stress:   . Feeling of Stress :   Social Connections:   . Frequency of Communication with Friends and Family:   . Frequency of Social Gatherings with Friends and Family:   . Attends Religious Services:   . Active Member of Clubs or Organizations:   . Attends Archivist Meetings:   Marland Kitchen Marital Status:   Intimate Partner Violence:   . Fear of Current or Ex-Partner:   . Emotionally Abused:   Marland Kitchen Physically Abused:   . Sexually Abused:      Review of Systems: General: negative for chills, fever, night sweats or weight changes.  Cardiovascular: negative for chest pain, dyspnea on exertion, edema, orthopnea, palpitations, paroxysmal nocturnal dyspnea or shortness of breath Dermatological: negative for rash Respiratory: negative for cough or wheezing Urologic: negative for hematuria Abdominal: negative for nausea, vomiting, diarrhea, bright red blood per rectum, melena, or hematemesis Neurologic: negative for visual changes, syncope, or dizziness All other systems reviewed and are otherwise negative except as noted above.    Blood pressure (!) 146/70, pulse 78, height 5\' 10"  (1.778 m), weight 214 lb (97.1 kg), SpO2 96 %.  General appearance: alert and no distress Neck: no adenopathy, no carotid bruit, no JVD, supple, symmetrical, trachea midline and thyroid not enlarged, symmetric, no tenderness/mass/nodules Lungs: clear to auscultation bilaterally Heart: regular rate and rhythm, S1, S2 normal, no murmur, click, rub or gallop Extremities: extremities normal, atraumatic, no cyanosis or edema Pulses: 2+ and symmetric Skin: Skin color, texture, turgor normal. No rashes or lesions Neurologic: Alert and oriented X 3, normal strength and tone. Normal symmetric reflexes. Normal coordination  and gait  EKG sinus rhythm at 78 with occasional PVCs and nonspecific ST and T wave changes.  I personally reviewed this EKG.  ASSESSMENT AND PLAN:   Hypertension History of essential hypertension a blood pressure measured today at 146/70.  He is on diltiazem and metoprolol.  Atrial fibrillation ([place]) History of PAF maintaining sinus rhythm on Xarelto.  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      Lorretta Harp MD FACP,FACC,FAHA, [facility] 03/09/2020 4:54 PM

## 2020-03-09 NOTE — Patient Instructions (Signed)
Medication Instructions:  Your physician recommends that you continue on your current medications as directed. Please refer to the Current Medication list given to you today.  *If you need a refill on your cardiac medications before your next appointment, please call your pharmacy*   Follow-Up: At [facility], you and your health needs are our priority.  As part of our continuing mission to provide you with exceptional heart care, we have created designated Provider Care Teams.  These Care Teams include your primary Cardiologist (physician) and Advanced Practice Providers (APPs -  Physician Assistants and Nurse Practitioners) who all work together to provide you with the care you need, when you need it.  We recommend signing up for the patient portal called "MyChart".  Sign up information is provided on this After Visit Summary.  MyChart is used to connect with patients for Virtual Visits (Telemedicine).  Patients are able to view lab/test results, encounter notes, upcoming appointments, etc.  Non-urgent messages can be sent to your provider as well.   To learn more about what you can do with MyChart, go to [email or web address].com.    Your next appointment:   12 month(s)  The format for your next appointment:   In Person  Provider:   You may see Jonathan Berry, MD or one of the following Advanced Practice Providers on your designated Care Team:    Luke Kilroy, PA-C  Callie Goodrich, PA-C  Jesse Cleaver, FNP    Other Instructions   

## 2020-03-09 NOTE — Assessment & Plan Note (Signed)
History of hyperlipidemia on lovastatin followed by his PCP 

## 2020-03-09 NOTE — Assessment & Plan Note (Signed)
History of essential hypertension a blood pressure measured today at 146/70.  He is on diltiazem and metoprolol.

## 2020-03-09 NOTE — Assessment & Plan Note (Signed)
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

## 2020-03-09 NOTE — Assessment & Plan Note (Signed)
History of PAF maintaining sinus rhythm on Xarelto. 

## 2020-05-05 ENCOUNTER — Other Ambulatory Visit: Payer: Self-pay | Admitting: Cardiovascular Disease

## 2020-05-14 ENCOUNTER — Telehealth: Payer: Self-pay | Admitting: Cardiovascular Disease

## 2020-05-14 NOTE — Telephone Encounter (Signed)
Pt c/o medication issue:  1. Name of Medication: XARELTO 20 MG TABS tablet  2. How are you currently taking this medication (dosage and times per day)? As directed   3. Are you having a reaction (difficulty breathing--STAT)? No  4. What is your medication issue? Patient's wife states the patient can no longer afford the medication. She is requesting assistance with purchasing or an alternative medication. Please call to discuss.

## 2020-05-14 NOTE — Telephone Encounter (Signed)
Patient went to pick up prescription and it was $650 for xarelto  Was previously $47 Explained to wife he may be in donut hole  Provided 3 weeks of samples Lot: [id number] Exp: 11/2021  Patient assistance application provided also Advised Dr. Gwenlyn Found is in office tomorrow and 8/17 and can sign application to be submitted one of these days, if they can return

## 2020-06-20 ENCOUNTER — Telehealth: Payer: Self-pay | Admitting: Allergy and Immunology

## 2020-06-20 NOTE — Telephone Encounter (Signed)
Patient's wife states he needs a prescription for Breo and would like samples. Wife informed that patient has not been seen in over two years and needs an office visit before a prescription can be sent in. Wife will call back with patient's schedule to make an appointment, but wants to know if they can get samples until his appointment.  Please advise.

## 2020-06-20 NOTE — Telephone Encounter (Signed)
Looked in chart and seen a message from Dr. Neldon Mc stating the Memory Dance should be switched to Arnuity due to patients a.fibb. I called and informed patients wife of that and she expressed understanding. I did let her know that he would need an appointment before we can send anything in and she stated she could not schedule at the moment and will give [place] a callback.

## 2020-08-20 ENCOUNTER — Telehealth: Payer: Self-pay | Admitting: Cardiovascular Disease

## 2020-08-20 NOTE — Telephone Encounter (Signed)
Left detailed message-samples at the front desk

## 2020-08-20 NOTE — Telephone Encounter (Signed)
Patient calling the office for samples of medication:   1.  What medication and dosage are you requesting samples for? XARELTO 20 MG TABS tablet  2.  Are you currently out of this medication? Yes, ran out two days ago.

## 2020-08-21 ENCOUNTER — Telehealth: Payer: Self-pay | Admitting: Cardiovascular Disease

## 2020-08-21 NOTE — Telephone Encounter (Signed)
Patients wife on the line saying that they need samples of Xarelto due to expense. Please call.

## 2020-08-21 NOTE — Telephone Encounter (Signed)
Spoke to [facility] .  Informed her samples are available for pick up.  she states patient did not inform the samples were available  RN  Question the cost of medication - [name] states the patient is was about $169  Last time he picked up from the pharmacy.  RN  Recommend [name] to contact insurance to find out the reason to see it  Patient assistance maybe started if these a something that patient is eligible or  May need to change if Dr Gwenlyn Found is in agreement,.   [name] states she will call insurance

## 2020-08-21 NOTE — Telephone Encounter (Signed)
**Note De-Identified Letonya Mangels Obfuscation** Will forward to NL triage.

## 2020-08-21 NOTE — Telephone Encounter (Signed)
Spoke to [facility] .  Informed her samples are available for pick up.  she states patient did not inform the samples were available  RN  Question the cost of medication - [name] states the patient is was about $169  Last time he picked up from the pharmacy.  RN  Recommend [name] to contact insurance to find out the reason to see it  Patient assistance maybe started if these a something that patient is eligible or  May need to change if Dr Gwenlyn Found is in agreement,.   June Vacha states she will call insurance

## 2020-10-08 ENCOUNTER — Telehealth: Payer: Self-pay | Admitting: Cardiovascular Disease

## 2020-10-08 NOTE — Telephone Encounter (Signed)
Patient made aware.  Medication Samples have been provided to the patient.  Drug name: Xarelto       Strength: 20 mg        Qty: 3 bottles  LOT: [id number]  Exp.Date: 8/23

## 2020-10-08 NOTE — Telephone Encounter (Signed)
Patient calling the office for samples of medication:   1.  What medication and dosage are you requesting samples for? XARELTO 20 MG TABS tablet  2.  Are you currently out of this medication? yes   

## 2021-01-11 ENCOUNTER — Telehealth: Payer: Self-pay | Admitting: Cardiovascular Disease

## 2021-01-11 NOTE — Telephone Encounter (Signed)
Patient calling the office for samples of medication:   1.  What medication and dosage are you requesting samples for? XARELTO 20 MG TABS tablet   2.  Are you currently out of this medication? Yes    

## 2021-01-11 NOTE — Telephone Encounter (Signed)
Spoke to patient's wife.Xarelto 20 mg samples left at front desk.Stated husband has been taking Xarelto 20 mg every other day to make last longer.Advised he needs to take 20 mg everyday.

## 2021-03-12 ENCOUNTER — Telehealth: Payer: Self-pay | Admitting: Cardiovascular Disease

## 2021-03-12 NOTE — Telephone Encounter (Signed)
Patient calling the office for samples of medication:   1.  What medication and dosage are you requesting samples for? XARELTO 20 MG TABS tablet  2.  Are you currently out of this medication? Pt is currently out of this medicine

## 2021-03-12 NOTE — Telephone Encounter (Signed)
Samples placed for the pt

## 2021-04-17 ENCOUNTER — Telehealth: Payer: Self-pay | Admitting: Cardiovascular Disease

## 2021-04-17 NOTE — Telephone Encounter (Signed)
Patient calling the office for samples of medication:   1.  What medication and dosage are you requesting samples for? XARELTO 20 MG TABS tablet  2.  Are you currently out of this medication? Yes pt is completely out of this medicine

## 2021-04-17 NOTE — Telephone Encounter (Signed)
This RN called patient back, patient confirms he has been taking his Xarelto as directed, 20mg  daily. This RN noticed pt has not been seen by Dr.Berry since 03/09/20. This RN scheduled patient with Dr. Kennon Holter first available office visit spot on 07/30/2021 at 11:15am. This RN also asked pt if he had completed pt assistance, pt reports he has but his income was too high. This RN to give pt Xarelto assistance paperwork again with samples. This RN left 3 samples of Xarelto 20mg  at front desk for patient to pick up, along with assistance paperwork. Pt verbalized understanding to keep appointment and receive paperwork and medication at office. All questions/concerns addressed at this time.

## 2021-05-17 ENCOUNTER — Encounter: Payer: Self-pay | Admitting: Emergency Medicine

## 2021-05-17 ENCOUNTER — Other Ambulatory Visit: Payer: Self-pay

## 2021-05-17 ENCOUNTER — Ambulatory Visit
Admission: EM | Admit: 2021-05-17 | Discharge: 2021-05-17 | Disposition: A | Discharge status: Home / Self Care | Payer: Medicare Other | Attending: Internal Medicine | Admitting: Internal Medicine

## 2021-05-17 DIAGNOSIS — L02212 Cutaneous abscess of back [any part, except buttock]: Secondary | ICD-10-CM | POA: Diagnosis not present

## 2021-05-17 MED ORDER — DOXYCYCLINE HYCLATE 100 MG PO CAPS: 100.0000 mg | ORAL_CAPSULE | Freq: Two times a day (BID) | ORAL | 0 refills | Status: DC

## 2021-05-17 NOTE — Discharge Instructions (Addendum)
You have an abscess to your back that you have been prescribed doxycycline antibiotic to treat.  Please take antibiotic with food to avoid stomach upset.  Please use warm compresses to abscess for 20 minutes at a time 2-3 times daily.  It is important that you follow-up with general surgeon with already scheduled appointment for further evaluation and management.  Please go to the hospital if abscess worsens or if you develop fever.

## 2021-05-17 NOTE — ED Triage Notes (Signed)
Patient c/o abscess on the top of his back/neck for several weeks.  This morning there was some drainage from the area, bloody drainage from the site.  No pain today.  Patient denies any Tylenol or Ibuprofen.

## 2021-05-17 NOTE — ED Provider Notes (Signed)
[facility]    CSN: [id number] Arrival date & time: 05/17/21  0818      History   Chief Complaint Chief Complaint  Patient presents with   Abscess    HPI Christian Harrison is a 75 y.o. male.   Patient presents with abscess to back that has been present for approximately 6 months.  Patient states that he had abscess in same area of back approximately 1 year ago, and it was removed by dermatologist.  Patient was told that it was a cyst.  Abscess began to drain this morning and became more painful.  Denies any known fevers or chills at home.  Patient states that abscess has decreased in size over the past few days.  States that he already has an appointment with a general surgeon on 8/31 for further evaluation and management of abscess.   Abscess  Past Medical History:  Diagnosis Date   Acid reflux    Asthma 09-08-11   controlled with inhalers   Colon cancer ([place]) 09-08-11   colon ca dx 02/2010. Chemo x 12 sessions   Complication of anesthesia    ? pneumonia after surgery in 2011   COPD (chronic obstructive pulmonary disease) ([place]) 09-08-11   previous smoker   Dysrhythmia 03/2010   PAF after colon resection in the setting of partial SBO -   Hernia    Hiatal hernia    Hypertension    Incisional hernia s/p open [place] repair w mesh [id number] 09/13/2011    Patient Active Problem List   Diagnosis Date Noted   Atrial fibrillation with RVR ([place]) 01/30/2019   Persistent atrial fibrillation with RVR ([facility])    Chest pain    Cough    SOB (shortness of breath)    Long term (current) use of anticoagulants 12/22/2018   COPD with acute exacerbation ([place]) 12/22/2018   Cardiomyopathy ([place]) 12/22/2018   Hyperlipidemia 10/12/2018   Diarrhea 03/03/2013   History of colon cancer, stage III 11/22/2012   SBO (small bowel obstruction) ([place]) 09/19/2011   Asthma 09/16/2011   Hypertension 09/16/2011   Incisional hernia s/p open [place] repair w mesh [id number] 09/13/2011   Atrial fibrillation  ([place]) 09/13/2011    Past Surgical History:  Procedure Laterality Date   COLON SURGERY  09-08-11   surgery in 6'11   DG FINGER*L*  09-08-11   left index finger-tendon release   HAND SURGERY     LEFT   PORT-A-CATH REMOVAL  09/10/2011   Procedure: REMOVAL PORT-A-CATH;  Surgeon: Adin Hector, MD;  Location: [facility];  Service: General;  Laterality: N/A;  Removal Port-a-Cath   VENTRAL HERNIA REPAIR  09/10/2011   Procedure: LAPAROSCOPIC VENTRAL HERNIA;  Surgeon: Adin Hector, MD;  Location: [facility];  Service: General;  Laterality: N/A;  Lysis of [place], Converted to open, with Physio Mesh       Home Medications    Prior to Admission medications   Medication Sig Start Date End Date Taking? Authorizing Provider  citalopram (CELEXA) 40 MG tablet Take 20 mg by mouth every evening.    Yes [provider]  diltiazem (CARDIZEM CD) 120 MG 24 hr capsule Take 120 mg by mouth daily. 01/25/20  Yes [provider]  doxycycline (VIBRAMYCIN) 100 MG capsule Take 1 capsule (100 mg total) by mouth 2 (two) times daily. 05/17/21  Yes Odis Luster, FNP  ipratropium-albuterol (DUONEB) 0.5-2.5 (3) MG/3ML SOLN USE ONE VIAL IN THE NEBULIZER EVERY 4-6 HOURS IF NEEDED FOR COUGH OR  WHEEZE 08/26/18  Yes Kozlow, Donnamarie Poag, MD  lovastatin (MEVACOR) 20 MG tablet Take 20 mg by mouth every evening.  05/02/17  Yes [provider]  metoprolol tartrate (LOPRESSOR) 50 MG tablet Take 1 tablet (50 mg total) by mouth 2 (two) times daily. 01/31/19  Yes Seawell, Jaimie A, DO  Multiple Vitamins-Minerals (MULTIVITAMINS THER. W/MINERALS) TABS Take 1 tablet by mouth every evening.    Yes [provider]  traZODone (DESYREL) 150 MG tablet Take 150 mg by mouth at bedtime as needed for sleep.    Yes [provider]  vitamin A 8000 UNIT capsule Take 8,000 Units by mouth daily.    Yes [provider]  vitamin E 400 UNIT capsule Take 400 Units by mouth daily.    Yes [provider]  XARELTO 20 MG TABS tablet TAKE 1 TABLET BY MOUTH ONCE DAILY WITH SUPPER 05/07/20  Yes Lorretta Harp, MD    Family History Family History  Problem Relation Age of Onset   Heart disease Mother    Allergic rhinitis Neg Hx    Asthma Neg Hx     Social History Social History   Tobacco Use   Smoking status: Former    Years: 1.00    Types: Cigarettes    Quit date: 09/18/1970    Years since quitting: 50.6   Smokeless tobacco: Never  Substance Use Topics   Alcohol use: No   Drug use: No     Allergies   Penicillins   Review of Systems Review of Systems Per HPI  Physical Exam Triage Vital Signs ED Triage Vitals  Enc Vitals Group     BP 05/17/21 0850 139/70     Pulse Rate 05/17/21 0850 64     Resp --      Temp 05/17/21 0850 98.3 F (36.8 C)     Temp Source 05/17/21 0850 Oral     SpO2 05/17/21 0850 92 %     Weight 05/17/21 0852 210 lb (95.3 kg)     Height 05/17/21 0852 5' 10.5" (1.791 m)     Head Circumference --      Peak Flow --      Pain Score 05/17/21 0852 1     Pain Loc --      Pain Edu? --      Excl. in [place]? --    No data found.  Updated Vital Signs BP 139/70 (BP Location: Left Arm)   Pulse 64   Temp 98.3 F (36.8 C) (Oral)   Ht 5' 10.5" (1.791 m)   Wt 210 lb (95.3 kg)   SpO2 95%   BMI 29.71 kg/m   Visual Acuity Right Eye Distance:   Left Eye Distance:   Bilateral Distance:    Right Eye Near:   Left Eye Near:    Bilateral Near:     Physical Exam Constitutional:      Appearance: Normal appearance.  HENT:     Head: Normocephalic and atraumatic.  Eyes:     Extraocular Movements: Extraocular movements intact.     Conjunctiva/sclera: Conjunctivae normal.  Pulmonary:     Effort: Pulmonary effort is normal.  Skin:    General: Skin is warm and dry.     Findings: Abscess present.     Comments: Approximately 2.5 cm to 3 cm indurated abscess present to left upper back directly to left of spine.  Abscess is currently draining during physical  exam.  Has thick, purulent drainage.  Neurological:  General: No focal deficit present.     Mental Status: He is alert and oriented to person, place, and time. Mental status is at baseline.  Psychiatric:        Mood and Affect: Mood normal.        Behavior: Behavior normal.        Thought Content: Thought content normal.        Judgment: Judgment normal.     UC Treatments / Results  Labs (all labs ordered are listed, but only abnormal results are displayed) Labs Reviewed - No data to display  EKG   Radiology No results found.  Procedures Procedures (including critical care time)  Medications Ordered in UC Medications - No data to display  Initial Impression / Assessment and Plan / UC Course  I have reviewed the triage vital signs and the nursing notes.  Pertinent labs & imaging results that were available during my care of the patient were reviewed by me and considered in my medical decision making (see chart for details).     Abscess already draining on physical exam without I&D.  Attempted to squeeze abscess and drained approximately 30 cc of thick, purulent drainage from abscess.  Abscess significantly decreased in size after this.  I&D is not necessary at this time as abscess is already draining on its own.  Patient is also on Xarelto, so do not think it is safe practice to open abscess further.  Location of abscess is also not safe to drain in urgent care.  Doxycycline x10 days prescribed to treat infection.  Advised patient to use warm compresses as well.  Advised patient of the importance of keeping general surgery appointment at end of the month. Discussed strict return precautions. Patient verbalized understanding and is agreeable with plan.  Final Clinical Impressions(s) / UC Diagnoses   Final diagnoses:  Cutaneous abscess of back (any part, except buttock)     Discharge Instructions      You have an abscess to your back that you have been prescribed  doxycycline antibiotic to treat.  Please take antibiotic with food to avoid stomach upset.  Please use warm compresses to abscess for 20 minutes at a time 2-3 times daily.  It is important that you follow-up with general surgeon with already scheduled appointment for further evaluation and management.  Please go to the hospital if abscess worsens or if you develop fever.     ED Prescriptions     Medication Sig Dispense Auth. Provider   doxycycline (VIBRAMYCIN) 100 MG capsule Take 1 capsule (100 mg total) by mouth 2 (two) times daily. 20 capsule Odis Luster, FNP      PDMP not reviewed this encounter.   Odis Luster, [place] 05/17/21 [phone]

## 2021-06-03 ENCOUNTER — Telehealth: Payer: Self-pay | Admitting: Cardiovascular Disease

## 2021-06-03 NOTE — Telephone Encounter (Signed)
Patient calling the office for samples of medication:   1.  What medication and dosage are you requesting samples for? XARELTO 20 MG TABS tablet  2.  Are you currently out of this medication? yes   

## 2021-06-03 NOTE — Telephone Encounter (Signed)
Returned call to patient advised patient that 3 bottles of Xarelto '20mg'$  LOT: [id number] EXP: 10/24 were at the front desk for pick up. Patient verbalized understanding.

## 2021-07-16 ENCOUNTER — Telehealth: Payer: Self-pay | Admitting: Cardiovascular Disease

## 2021-07-16 DIAGNOSIS — I480 Paroxysmal atrial fibrillation: Secondary | ICD-10-CM

## 2021-07-16 NOTE — Telephone Encounter (Signed)
*  STAT* If patient is at the pharmacy, call can be transferred to refill team.   1. Which medications need to be refilled? (please list name of each medication and dose if known)  XARELTO 20 MG TABS tablet  2. Which pharmacy/location (including street and city if local pharmacy) is medication to be sent to? [place] ([place]),  - [address]  3. Do they need a 30 day or 90 day supply? 90 with refills  Patient is scheduled to see Dr. Gwenlyn Found 10.25.22 at 11:15 am

## 2021-07-17 ENCOUNTER — Other Ambulatory Visit: Payer: Self-pay | Admitting: Cardiovascular Disease

## 2021-07-17 MED ORDER — RIVAROXABAN 20 MG PO TABS: 20.0000 mg | ORAL_TABLET | Freq: Every day | ORAL | 0 refills | Status: DC

## 2021-07-17 NOTE — Telephone Encounter (Signed)
Prescription refill request for Xarelto received.  Indication:Afib Last office visit:upcoming Weight:95.3 kg Age:75 ACZ:YSAYT Labs CrCl:Needs Labs

## 2021-07-18 MED ORDER — RIVAROXABAN 20 MG PO TABS: 20.0000 mg | ORAL_TABLET | Freq: Every day | ORAL | 0 refills | Status: DC

## 2021-07-18 NOTE — Addendum Note (Signed)
Addended by: Rollen Sox on: 07/18/2021 10:09 AM   Modules accepted: Orders

## 2021-07-18 NOTE — Telephone Encounter (Signed)
Patient called back.  Appt scheduled for 07/30/21.  Lab orders placed.  Sent 30 day supply for patient until he can be seen by Dr Gwenlyn Found

## 2021-07-30 ENCOUNTER — Ambulatory Visit: Payer: Medicare Other | Admitting: Cardiovascular Disease

## 2021-07-30 ENCOUNTER — Encounter: Payer: Self-pay | Admitting: Cardiovascular Disease

## 2021-07-30 ENCOUNTER — Other Ambulatory Visit: Payer: Self-pay

## 2021-07-30 VITALS — BP 126/80 | HR 55 | Ht 70.5 in | Wt 207.0 lb

## 2021-07-30 DIAGNOSIS — I480 Paroxysmal atrial fibrillation: Secondary | ICD-10-CM

## 2021-07-30 DIAGNOSIS — E782 Mixed hyperlipidemia: Secondary | ICD-10-CM | POA: Diagnosis not present

## 2021-07-30 DIAGNOSIS — I10 Essential (primary) hypertension: Secondary | ICD-10-CM

## 2021-07-30 DIAGNOSIS — I429 Cardiomyopathy, unspecified: Secondary | ICD-10-CM

## 2021-07-30 NOTE — Assessment & Plan Note (Signed)
History of hyperlipidemia on lovastatin.  We will recheck a lipid liver profile this morning.

## 2021-07-30 NOTE — Assessment & Plan Note (Signed)
History of atrial fibrillation status post DC cardioversion maintaining sinus rhythm on Xarelto oral anticoagulation.

## 2021-07-30 NOTE — Assessment & Plan Note (Signed)
History of essential hypertension a blood pressure measured today 126/80.  He is on diltiazem, and metoprolol.

## 2021-07-30 NOTE — Progress Notes (Signed)
07/30/2021 WOODLEY PETZOLD   [birth year]  [id number]  Primary Physician Leonard Downing, MD Primary Cardiologist: Lorretta Harp MD [name], [place]  HPI:  Christian Harrison is a 75 y.o.  thin appearing married Caucasian male father of 2 stepchildren, grandfather of 53 stepgrandchildren referred by Dr. Claris Gower for cardiovascular evaluation because of newly recognized atrial fibrillation.    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  He was seen by Dr. Golden Hurter in consultation 09/13/2011 in the setting of A. fib with RVR during a hospitalization for hernia repair.  He is risk factors include treated hypertension and hyperlipidemia.  He is never had a heart attack or stroke.  He denies chest pain or shortness of breath.  There is no family history for heart disease.  He was recently seen by his PCP who noted that he was in A. fib rate controlled.  We will arrange for him to undergo outpatient cardioversion however he had missed a dose of his Xarelto and this was postponed.  He was admitted to the hospital back in April for COPD exacerbation and had attempted cardioversion which was unsuccessful.  He was begun on amiodarone.  A 2D echocardiogram performed 11/03/2018 revealed an EF of 40 to 45% with a mildly dilated left atrium.  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  He complains of some swelling in his feet.   I arranged for him to undergo outpatient cardioversion by Dr. Margaretann Loveless however when he presented to the hospital he was in sinus rhythm and this was canceled.  He remains on Xarelto oral anticoagulation.  Since I saw him back over a year ago he remains in sinus rhythm on Xarelto oral anticoagulation.  He denies chest pain or shortness of breath.  [place].   Current Meds  Medication Sig   citalopram (CELEXA) 40 MG tablet Take 20 mg by mouth every evening.    diltiazem  (CARDIZEM CD) 120 MG 24 hr capsule Take 120 mg by mouth daily.   lovastatin (MEVACOR) 20 MG tablet Take 20 mg by mouth every evening.    metoprolol tartrate (LOPRESSOR) 50 MG tablet Take 1 tablet (50 mg total) by mouth 2 (two) times daily.   Multiple Vitamins-Minerals (MULTIVITAMINS THER. W/MINERALS) TABS Take 1 tablet by mouth every evening.    rivaroxaban (XARELTO) 20 MG TABS tablet Take 1 tablet (20 mg total) by mouth daily with supper.   vitamin A 8000 UNIT capsule Take 8,000 Units by mouth daily.    vitamin E 400 UNIT capsule Take 400 Units by mouth daily.      Allergies  Allergen Reactions   Penicillins Anaphylaxis and Other (See Comments)    Seizures Did it involve swelling of the face/tongue/throat, SOB, or low BP? Yes Did it involve sudden or severe rash/hives, skin peeling, or any reaction on the inside of your mouth or nose? No Did you need to seek medical attention at a hospital or doctor's office? Yes When did it last happen?    teenager  If all above answers are "NO", may proceed with cephalosporin use.     Social History   Socioeconomic History   Marital status: Married    Spouse name: Not on file   Number of children: Not on file   Years of education: Not on file   Highest education level: Not  on file  Occupational History   Not on file  Tobacco Use   Smoking status: Former    Years: 1.00    Types: Cigarettes    Quit date: 09/18/1970    Years since quitting: 50.8   Smokeless tobacco: Never  Substance and Sexual Activity   Alcohol use: No   Drug use: No   Sexual activity: Yes    Birth control/protection: None  Other Topics Concern   Not on file  Social History Narrative   Not on file   Social Determinants of Health   Financial Resource Strain: Not on file  Food Insecurity: Not on file  Transportation Needs: Not on file  Physical Activity: Not on file  Stress: Not on file  Social Connections: Not on file  Intimate Partner Violence: Not on file      Review of Systems: General: negative for chills, fever, night sweats or weight changes.  Cardiovascular: negative for chest pain, dyspnea on exertion, edema, orthopnea, palpitations, paroxysmal nocturnal dyspnea or shortness of breath Dermatological: negative for rash Respiratory: negative for cough or wheezing Urologic: negative for hematuria Abdominal: negative for nausea, vomiting, diarrhea, bright red blood per rectum, melena, or hematemesis Neurologic: negative for visual changes, syncope, or dizziness All other systems reviewed and are otherwise negative except as noted above.    Blood pressure 126/80, pulse (!) 55, height 5' 10.5" (1.791 m), weight 207 lb (93.9 kg), SpO2 97 %.  General appearance: alert and no distress Neck: no adenopathy, no carotid bruit, no JVD, supple, symmetrical, trachea midline, and thyroid not enlarged, symmetric, no tenderness/mass/nodules Lungs: clear to auscultation bilaterally Heart: regular rate and rhythm, S1, S2 normal, no murmur, click, rub or gallop Extremities: extremities normal, atraumatic, no cyanosis or edema Pulses: 2+ and symmetric Skin: Skin color, texture, turgor normal. No rashes or lesions Neurologic: Grossly normal  EKG sinus bradycardia 55 with nonspecific ST and T wave changes.  I personally reviewed this EKG.  ASSESSMENT AND PLAN:   Hypertension History of essential hypertension a blood pressure measured today 126/80.  He is on diltiazem, and metoprolol.  Atrial fibrillation (HCC) History of atrial fibrillation status post DC cardioversion maintaining sinus rhythm on Xarelto oral anticoagulation.  Hyperlipidemia History of hyperlipidemia on lovastatin.  We will recheck a lipid liver profile this morning.  Cardiomyopathy ([place]) 2D echocardiogram performed 11/03/2018 revealed an EF of 40 to 45%.  He did have mild to moderate AI at that time.  His aortic root measured 4.4 cm as well.  We will recheck a 2D  echocardiogram.     Lorretta Harp MD [facility], [facility] 07/30/2021 12:02 PM

## 2021-07-30 NOTE — Assessment & Plan Note (Signed)
2D echocardiogram performed 11/03/2018 revealed an EF of 40 to 45%.  He did have mild to moderate AI at that time.  His aortic root measured 4.4 cm as well.  We will recheck a 2D echocardiogram.

## 2021-07-30 NOTE — Patient Instructions (Signed)
Medication Instructions:  Your physician recommends that you continue on your current medications as directed. Please refer to the Current Medication list given to you today.  *If you need a refill on your cardiac medications before your next appointment, please call your pharmacy*   Lab Work: Your physician recommends that you have labs drawn today: lipid/liver profile  If you have labs (blood work) drawn today and your tests are completely normal, you will receive your results only by: [place] (if you have MyChart) OR A paper copy in the mail If you have any lab test that is abnormal or we need to change your treatment, we will call you to review the results.  Testing/Procedures: Your physician has requested that you have an echocardiogram. Echocardiography is a painless test that uses sound waves to create images of your heart. It provides your doctor with information about the size and shape of your heart and how well your heart's chambers and valves are working. This procedure takes approximately one hour. There are no restrictions for this procedure. This procedure is done at [address].    Follow-Up: At [facility], you and your health needs are our priority.  As part of our continuing mission to provide you with exceptional heart care, we have created designated Provider Care Teams.  These Care Teams include your primary Cardiologist (physician) and Advanced Practice Providers (APPs -  Physician Assistants and Nurse Practitioners) who all work together to provide you with the care you need, when you need it.  We recommend signing up for the patient portal called "MyChart".  Sign up information is provided on this After Visit Summary.  MyChart is used to connect with patients for Virtual Visits (Telemedicine).  Patients are able to view lab/test results, encounter notes, upcoming appointments, etc.  Non-urgent messages can be sent to your provider as well.   To learn  more about what you can do with MyChart, go to [email or web address].    Your next appointment:   12 month(s)  The format for your next appointment:   In Person  Provider:   Quay Burow, MD

## 2021-07-31 LAB — HEPATIC FUNCTION PANEL
ALT: 21 IU/L (ref 0–44)
AST: 23 IU/L (ref 0–40)
Albumin: 4.6 g/dL (ref 3.7–4.7)
Alkaline Phosphatase: 91 IU/L (ref 44–121)
Bilirubin Total: 0.5 mg/dL (ref 0.0–1.2)
Bilirubin, Direct: 0.15 mg/dL (ref 0.00–0.40)
Total Protein: 6.9 g/dL (ref 6.0–8.5)

## 2021-07-31 LAB — LIPID PANEL
Chol/HDL Ratio: 3.7 ratio (ref 0.0–5.0)
Cholesterol, Total: 144 mg/dL (ref 100–199)
HDL: 39 mg/dL — ABNORMAL LOW (ref >39)
LDL Chol Calc (NIH): 86 mg/dL (ref 0–99)
Triglycerides: 99 mg/dL (ref 0–149)
VLDL Cholesterol Cal: 19 mg/dL (ref 5–40)

## 2021-08-13 ENCOUNTER — Other Ambulatory Visit: Payer: Self-pay | Admitting: Cardiovascular Disease

## 2021-08-13 DIAGNOSIS — I480 Paroxysmal atrial fibrillation: Secondary | ICD-10-CM

## 2021-08-13 NOTE — Telephone Encounter (Signed)
Prescription refill request for Xarelto received.  Indication: afib  Last office visit: Gwenlyn Found 07/30/2021 Weight:93.9 kg  Age: 75 yo  Scr: 0.99, 03/14/2019 CrCl: 84 ml/min    Pt overdue for labs, was suppose to come in on 10/25. However, CBC and Cmet not drawn. Called pt X2- no answer.

## 2021-08-14 ENCOUNTER — Ambulatory Visit (HOSPITAL_COMMUNITY): Payer: Medicare Other

## 2021-08-14 ENCOUNTER — Other Ambulatory Visit: Payer: Self-pay

## 2021-08-14 DIAGNOSIS — I480 Paroxysmal atrial fibrillation: Secondary | ICD-10-CM

## 2021-08-14 MED ORDER — RIVAROXABAN 20 MG PO TABS: 20.0000 mg | ORAL_TABLET | Freq: Every day | ORAL | 0 refills | Status: DC

## 2021-09-05 ENCOUNTER — Ambulatory Visit (HOSPITAL_COMMUNITY): Payer: Medicare Other

## 2021-09-24 ENCOUNTER — Encounter (HOSPITAL_COMMUNITY): Payer: Self-pay | Admitting: Cardiovascular Disease

## 2021-09-24 ENCOUNTER — Ambulatory Visit (HOSPITAL_COMMUNITY): Payer: Medicare Other | Attending: Cardiology

## 2022-01-17 ENCOUNTER — Ambulatory Visit: Payer: Medicare Other | Admitting: Podiatry

## 2022-01-17 ENCOUNTER — Encounter: Payer: Self-pay | Admitting: Podiatry

## 2022-01-17 ENCOUNTER — Ambulatory Visit (INDEPENDENT_AMBULATORY_CARE_PROVIDER_SITE_OTHER): Payer: Medicare Other

## 2022-01-17 DIAGNOSIS — L02619 Cutaneous abscess of unspecified foot: Secondary | ICD-10-CM

## 2022-01-17 DIAGNOSIS — D689 Coagulation defect, unspecified: Secondary | ICD-10-CM | POA: Diagnosis not present

## 2022-01-17 DIAGNOSIS — M79671 Pain in right foot: Secondary | ICD-10-CM

## 2022-01-17 DIAGNOSIS — L84 Corns and callosities: Secondary | ICD-10-CM | POA: Diagnosis not present

## 2022-01-17 DIAGNOSIS — M21622 Bunionette of left foot: Secondary | ICD-10-CM

## 2022-01-17 MED ORDER — DOXYCYCLINE HYCLATE 100 MG PO TABS: 100.0000 mg | ORAL_TABLET | Freq: Two times a day (BID) | ORAL | 1 refills | Status: DC

## 2022-01-20 NOTE — Progress Notes (Signed)
Subjective:  ? ?Patient ID: Christian Harrison, male   DOB: 76 y.o.   MRN: [id number]  ? ?HPI ?Patient presents with several problems with 1 being significant lesion formation plantar aspect foot that is tender when pressed and also noted to have structural changes with inflammation and prominence around the fifth metatarsal head.  Patient states it does get sore and increasingly has made it harder for him to be ambulatory.  Patient does not smoke likes to be active and is on blood thinner ? ? ?Review of Systems  ?All other systems reviewed and are negative. ? ? ?   ?Objective:  ?Physical Exam ?Vitals and nursing note reviewed.  ?Constitutional:   ?   Appearance: He is well-developed.  ?Pulmonary:  ?   Effort: Pulmonary effort is normal.  ?Musculoskeletal:     ?   General: Normal range of motion.  ?Skin: ?   General: Skin is warm.  ?Neurological:  ?   Mental Status: He is alert.  ?  ?Neurovascular status found to be diminished as far as sharp dull vibratory mild diminishment of pulses but intact with patient found to have prominence of the fifth metatarsal head left with keratotic lesion that is painful when pressed which makes walking difficult.  There is fluid around the area and it is aggravated with pressure and makes it difficult for him to be active as he wants to.  Patient has good digital perfusion well oriented x3 ? ?   ?Assessment:  ?Appears to be more of a bony issue with inflammation and also may be related to his overall gait structure ? ?   ?Plan:  ?H&P x-ray reviewed.  At this point I went ahead I did deep debridement of lesion I did discuss the consideration for surgery for this with removal of the fifth metatarsal head or floating osteotomy.  At this point we will see the response to conservative care he will continue with shoe gear modifications will be seen back as needed ? ?X-rays indicate enlargement around the head of the metatarsal bone no indications of soft tissue pathology or arthritis or stress  fracture ?   ? ? ?

## 2022-03-24 ENCOUNTER — Ambulatory Visit (INDEPENDENT_AMBULATORY_CARE_PROVIDER_SITE_OTHER): Payer: Medicare Other

## 2022-03-24 ENCOUNTER — Ambulatory Visit: Payer: Medicare Other | Admitting: Podiatry

## 2022-03-24 DIAGNOSIS — M79672 Pain in left foot: Secondary | ICD-10-CM

## 2022-03-24 DIAGNOSIS — L923 Foreign body granuloma of the skin and subcutaneous tissue: Secondary | ICD-10-CM | POA: Diagnosis not present

## 2022-03-24 MED ORDER — DOXYCYCLINE HYCLATE 100 MG PO TABS: 100.0000 mg | ORAL_TABLET | Freq: Two times a day (BID) | ORAL | 1 refills | Status: DC
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
## 2022-04-25 ENCOUNTER — Ambulatory Visit (INDEPENDENT_AMBULATORY_CARE_PROVIDER_SITE_OTHER): Payer: Medicare Other

## 2022-04-25 ENCOUNTER — Ambulatory Visit: Payer: Medicare Other | Admitting: Podiatry

## 2022-04-25 ENCOUNTER — Other Ambulatory Visit: Payer: Self-pay | Admitting: Podiatry

## 2022-04-25 ENCOUNTER — Ambulatory Visit: Payer: Medicare Other

## 2022-04-25 DIAGNOSIS — L02612 Cutaneous abscess of left foot: Secondary | ICD-10-CM | POA: Diagnosis not present

## 2022-04-25 DIAGNOSIS — M79672 Pain in left foot: Secondary | ICD-10-CM

## 2022-04-25 MED ORDER — DOXYCYCLINE HYCLATE 100 MG PO TABS: 100.0000 mg | ORAL_TABLET | Freq: Two times a day (BID) | ORAL | 1 refills | Status: DC

## 2022-04-25 NOTE — Progress Notes (Signed)
Subjective:   Patient ID: Christian Harrison, male   DOB: 76 y.o.   MRN: [id number]   HPI Patient presents stating he over the last week developed some increased swelling in his left foot and states that there is some discoloration with patient having had history of removal of foreign body a month ago.  He did do well and then it started up around the fifth metatarsal head and is moderately swollen   ROS      Objective:  Physical Exam  No systemic signs of infection normal temperature and no history of issues with patient found to have some keratotic tissue plantar left fifth metatarsal with some swelling around the fifth metatarsal head itself with no proximal edema edema drainage noted currently     Assessment:  Possibility that when he did have a foreign body he may have seeded some bacteria that has started to create an abscess or pathology with plantar issues     Plan:  I went ahead and did a block of the area I then debrided the area I did not note any pus or any drainage from the area but I am concerned about abscess.  I did open this area up I flushed it I did not again notice any pathology but I did place on antibiotic doxycycline again I want him to soak I placed him in surgical shoe and I want to see back in a week with strict instructions if any systemic signs of infection were to occur any proximal edema erythema drainage were to occur he is to go to the emergency room and contact us.  He understands there is a possibility there is an abscess which will need to be opened up possibility for bone infection and it will all depend on how it responds to continue conservative care  X-rays were negative for signs currently that there is osteolysis or bone infection there is some soft tissue swelling around the area and possibly a small bit of foreign body on the lateral side but did not correlate to any area that he is having a problem

## 2022-05-02 ENCOUNTER — Ambulatory Visit: Payer: Medicare Other | Admitting: Podiatry

## 2022-05-02 DIAGNOSIS — L02612 Cutaneous abscess of left foot: Secondary | ICD-10-CM

## 2022-05-02 MED ORDER — CIPROFLOXACIN HCL 500 MG PO TABS: 500.0000 mg | ORAL_TABLET | Freq: Two times a day (BID) | ORAL | 0 refills | Status: AC

## 2022-05-02 MED ORDER — DOXYCYCLINE HYCLATE 100 MG PO TABS: 100.0000 mg | ORAL_TABLET | Freq: Two times a day (BID) | ORAL | 0 refills | Status: DC

## 2022-05-05 NOTE — Progress Notes (Signed)
Subjective:   Patient ID: Christian Harrison, male   DOB: 76 y.o.   MRN: [id number]   HPI Patient states he still has some swelling on the outside of his left foot and he is currently still not finished his antibiotics stating it was doing well and now it is swollen again   ROS      Objective:  Physical Exam  Neurovascular status with patient found to have continued redness around the fifth metatarsal head left after having had a foreign body excised a little bit over a month ago from the left fifth metatarsal which was excised completely but still has not resolved as far as the swelling goes     Assessment:  Possibility that we may be dealing with an abscess here or other soft tissue or bone pathology as its not yet resolved despite opening the area and antibiotic     Plan:  H&P and went ahead today and again debrided tissue with no active drainage noted.  I am going to send him for an MRI to try to rule out an abscess in this area and I also added Cipro as an antibiotic for further broad coverage.  Strict instructions if any systemic signs of infection were to occur or any other pathology he is to contact us immediately and if not will be seen back in the next week at this point exhibits no indications of systemic pathology with no proximal edema erythema drainage noted no temperature or other indications of systemic infection  X-rays were negative for signs there is osteolysis of bone appears to be soft tissue

## 2022-05-09 ENCOUNTER — Ambulatory Visit: Payer: Medicare Other | Admitting: Podiatry

## 2022-05-15 ENCOUNTER — Ambulatory Visit
Admission: RE | Admit: 2022-05-15 | Discharge: 2022-05-15 | Disposition: A | Discharge status: Home / Self Care | Payer: Medicare Other | Source: Ambulatory Visit | Attending: Podiatry | Admitting: Podiatry

## 2022-05-15 DIAGNOSIS — L02612 Cutaneous abscess of left foot: Secondary | ICD-10-CM

## 2022-05-21 ENCOUNTER — Ambulatory Visit (INDEPENDENT_AMBULATORY_CARE_PROVIDER_SITE_OTHER): Payer: Medicare Other

## 2022-05-21 ENCOUNTER — Encounter: Payer: Self-pay | Admitting: Podiatry

## 2022-05-21 ENCOUNTER — Ambulatory Visit: Payer: Medicare Other | Admitting: Podiatry

## 2022-05-21 DIAGNOSIS — L923 Foreign body granuloma of the skin and subcutaneous tissue: Secondary | ICD-10-CM | POA: Diagnosis not present

## 2022-05-21 DIAGNOSIS — L02612 Cutaneous abscess of left foot: Secondary | ICD-10-CM

## 2022-05-21 MED ORDER — CIPROFLOXACIN HCL 500 MG PO TABS: 500.0000 mg | ORAL_TABLET | Freq: Two times a day (BID) | ORAL | 0 refills | Status: DC
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
## 2022-05-22 ENCOUNTER — Telehealth: Payer: Self-pay | Admitting: *Deleted

## 2022-05-22 MED ORDER — CIPROFLOXACIN HCL 500 MG PO TABS: 500.0000 mg | ORAL_TABLET | Freq: Two times a day (BID) | ORAL | 0 refills | Status: AC

## 2022-05-22 NOTE — Telephone Encounter (Signed)
sent 
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
## 2022-05-22 NOTE — Telephone Encounter (Signed)
Please send cipro to pharmacy that they are requesting

## 2022-06-12 ENCOUNTER — Other Ambulatory Visit: Payer: Self-pay | Admitting: Podiatry

## 2022-06-12 ENCOUNTER — Telehealth: Payer: Self-pay | Admitting: Podiatry

## 2022-06-12 MED ORDER — CIPROFLOXACIN HCL 500 MG PO TABS: 500.0000 mg | ORAL_TABLET | Freq: Two times a day (BID) | ORAL | 0 refills | Status: AC

## 2022-06-12 NOTE — Telephone Encounter (Signed)
Patient wife called she states his foot is infected again , it is red and swollen has pus draining from it.  Offered earlier appt -wife wanted appt to be next Thursday at 115p.  She said he has already taken 4 rounds of Cipro and its not doing anything for it. Please advise

## 2022-06-12 NOTE — Telephone Encounter (Signed)
Should come in Monday. I will put back on antibiotic until he can be seen. He has been on doxy and I'm switching to cipro

## 2022-06-18 ENCOUNTER — Ambulatory Visit: Payer: Medicare Other | Admitting: Podiatry

## 2022-06-19 ENCOUNTER — Telehealth: Payer: Self-pay

## 2022-06-19 ENCOUNTER — Ambulatory Visit: Payer: Medicare Other | Admitting: Podiatry

## 2022-06-19 ENCOUNTER — Ambulatory Visit (INDEPENDENT_AMBULATORY_CARE_PROVIDER_SITE_OTHER): Payer: Medicare Other

## 2022-06-19 DIAGNOSIS — L089 Local infection of the skin and subcutaneous tissue, unspecified: Secondary | ICD-10-CM | POA: Diagnosis not present

## 2022-06-19 DIAGNOSIS — M7752 Other enthesopathy of left foot: Secondary | ICD-10-CM

## 2022-06-19 DIAGNOSIS — L02612 Cutaneous abscess of left foot: Secondary | ICD-10-CM

## 2022-06-19 DIAGNOSIS — L923 Foreign body granuloma of the skin and subcutaneous tissue: Secondary | ICD-10-CM | POA: Diagnosis not present

## 2022-06-19 NOTE — Telephone Encounter (Signed)
DOS 06/25/2022  EXC FOREIGN BODY LT - 28020 BONE BIOPSY LT - 20220 INCISION & DRAINAGE LT - 10060  [facility] MEDICARE  Notification or Prior Authorization is not required for the requested services  Decision ID #:[id number]
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
## 2022-06-25 ENCOUNTER — Encounter: Payer: Self-pay | Admitting: Podiatry

## 2022-06-25 ENCOUNTER — Other Ambulatory Visit (INDEPENDENT_AMBULATORY_CARE_PROVIDER_SITE_OTHER): Payer: Medicare Other | Admitting: Podiatry

## 2022-06-25 DIAGNOSIS — L923 Foreign body granuloma of the skin and subcutaneous tissue: Secondary | ICD-10-CM

## 2022-06-25 DIAGNOSIS — L089 Local infection of the skin and subcutaneous tissue, unspecified: Secondary | ICD-10-CM

## 2022-06-25 DIAGNOSIS — M7752 Other enthesopathy of left foot: Secondary | ICD-10-CM

## 2022-06-25 DIAGNOSIS — L02612 Cutaneous abscess of left foot: Secondary | ICD-10-CM

## 2022-06-25 MED ORDER — OXYCODONE-ACETAMINOPHEN 5-325 MG PO TABS: 1.0000 | ORAL_TABLET | ORAL | 0 refills | Status: AC | PRN

## 2022-06-25 MED ORDER — ONDANSETRON HCL 4 MG PO TABS: 4.0000 mg | ORAL_TABLET | Freq: Three times a day (TID) | ORAL | 0 refills | Status: DC | PRN

## 2022-06-25 MED ORDER — DOXYCYCLINE HYCLATE 100 MG PO TABS: 100.0000 mg | ORAL_TABLET | Freq: Two times a day (BID) | ORAL | 0 refills | Status: AC

## 2022-06-25 NOTE — Progress Notes (Signed)
Rx for postop medications sent  - Percocet 5-325 mg take 1 tablet every 4 hours as needed for postoperative pain disp 30 tablets 0 refill - Zofran '4mg'$  take 1 tablet every 8 hours as needed for nausea disp 20 tablets 0 refill - Antibiotic: Doxycycline 100 mg BID x 10 days disp 20 tablets 0 refill

## 2022-06-27 ENCOUNTER — Telehealth: Payer: Self-pay | Admitting: *Deleted

## 2022-06-27 NOTE — Telephone Encounter (Signed)
[facility] is calling make sure that their bone biopsy , left foot results were received. Has been received and has been placed in physician's folder for review.06/27/22.

## 2022-07-03 ENCOUNTER — Ambulatory Visit (INDEPENDENT_AMBULATORY_CARE_PROVIDER_SITE_OTHER): Payer: Medicare Other | Admitting: Podiatry

## 2022-07-03 ENCOUNTER — Ambulatory Visit: Payer: Medicare Other

## 2022-07-03 DIAGNOSIS — Z9889 Other specified postprocedural states: Secondary | ICD-10-CM

## 2022-07-03 DIAGNOSIS — L089 Local infection of the skin and subcutaneous tissue, unspecified: Secondary | ICD-10-CM

## 2022-07-03 DIAGNOSIS — L923 Foreign body granuloma of the skin and subcutaneous tissue: Secondary | ICD-10-CM

## 2022-07-03 MED ORDER — GABAPENTIN 300 MG PO CAPS: 300.0000 mg | ORAL_CAPSULE | Freq: Every day | ORAL | 0 refills | Status: DC

## 2022-07-05 NOTE — Progress Notes (Signed)
Subjective:  Patient ID: Christian Harrison, male    DOB: [birth year],  MRN: [id number]  Chief Complaint  Patient presents with   Routine Post Op    EXCISION FOREIGN BODY, IRRIGATION & DEBRIDEMENT OF ULCER, POSS BONE BIOPSY LT FOOT    DOS: June 25, 2022 Procedure: Removal foreign body left forefoot, irrigation debridement of ulceration left plantar forefoot some fifth met head, fifth metatarsal head bone biopsy  76 y.o. male returns for post-op check.  He states he has been doing pretty well since surgery.  His pain is controlled at current time with pain medication.  He has kept the dressing clean dry and intact as instructed since surgery.  He is wearing a postoperative shoe as instructed.  Review of Systems: Negative except as noted in the HPI. Denies N/V/F/Ch.   Objective:  There were no vitals filed for this visit. There is no height or weight on file to calculate BMI. Constitutional Well developed. Well nourished.  Vascular Foot warm and well perfused. Capillary refill normal to all digits.  Calf is soft and supple, no posterior calf or knee pain, negative Homans' sign  Neurologic Normal speech. Oriented to person, place, and time. Epicritic sensation to light touch grossly present bilaterally.  Dermatologic Skin healing well without signs of infection. Skin edges well coapted without signs of infection at the lateral fifth met head incision.  Still with a shallow ulceration present plantar fifth met head though this is appears improved at this time with decreased depth and healthy granular tissue present wound base minimal hyperkeratotic tissue buildup around the wound.  There is still significant edema and mild erythema of the soft tissue lateral to the fifth met head similar to before surgery.  Orthopedic: Tenderness to palpation noted about the surgical site.      Multiple view plain film radiographs: Deferred at this visit Assessment:   1. Post-operative state   2.  Foreign body granuloma of skin and subcutaneous tissue   3. Local infection of skin and subcutaneous tissue    Plan:  Patient was evaluated and treated and all questions answered.  S/p foot surgery left foot removal of retained foreign body found to be a small tip of the suture needle likely present from prior surgery in the area.  Additionally irrigation debridement of the wound with bone biopsy fifth metatarsal head. -Pathology report from the bone biopsy indicates bone without significant changes consistent with osteomyelitis -However culture of the bone biopsy indicates possible gram-positive infection though unclear if this is truly the bone biopsy for culture versus the wound swab that was taken at the same time of surgery. -Progressing as expected post-operatively.  Wound at the plantar aspect of the left fifth metatarsal head is improving prior to preoperative with decreased depth and size.  We will continue with local wound care including daily dressing changes per the patient with antibiotic ointment and dry sterile dressing.  Hopeful that the edema he is having present to the left lateral foot will decrease with time -XR: We will obtain repeat radiographs at next visit. -WB Status: Weightbearing as tolerated in postop shoe -Sutures: Removed single suture present to the lateral incision at the fifth metatarsal head area.. -Medications: Sent prescription for gabapentin 300 mg 1 times daily at night as needed for neuropathic pain -Foot redressed with antibiotic ointment and dry sterile gauze dressing. -We will need to follow-up on bone culture results at next visit and determine if a repeat MRI is warranted.  If no  improvement in the lateral fifth met head erythema/edema we will have to consider resection of the fifth metatarsal head and possibly resection of infected bursa.  Return in about 2 weeks (around 07/17/2022) for 2nd POV left foot.         Everitt Amber, DPM [facility] [place] [facility]

## 2022-07-09 ENCOUNTER — Encounter: Payer: Self-pay | Admitting: Podiatry

## 2022-07-10 ENCOUNTER — Encounter: Payer: Self-pay | Admitting: Podiatry

## 2022-07-17 ENCOUNTER — Ambulatory Visit (INDEPENDENT_AMBULATORY_CARE_PROVIDER_SITE_OTHER): Payer: Medicare Other | Admitting: Podiatry

## 2022-07-17 ENCOUNTER — Ambulatory Visit (INDEPENDENT_AMBULATORY_CARE_PROVIDER_SITE_OTHER): Payer: Medicare Other

## 2022-07-17 ENCOUNTER — Ambulatory Visit: Payer: Medicare Other

## 2022-07-17 DIAGNOSIS — Z9889 Other specified postprocedural states: Secondary | ICD-10-CM

## 2022-07-17 DIAGNOSIS — L923 Foreign body granuloma of the skin and subcutaneous tissue: Secondary | ICD-10-CM

## 2022-07-17 DIAGNOSIS — M7752 Other enthesopathy of left foot: Secondary | ICD-10-CM

## 2022-07-17 DIAGNOSIS — M21622 Bunionette of left foot: Secondary | ICD-10-CM

## 2022-07-17 DIAGNOSIS — L089 Local infection of the skin and subcutaneous tissue, unspecified: Secondary | ICD-10-CM

## 2022-07-17 MED ORDER — MUPIROCIN 2 % EX OINT: 1.0000 | TOPICAL_OINTMENT | Freq: Every day | CUTANEOUS | 0 refills | Status: DC

## 2022-07-18 ENCOUNTER — Telehealth: Payer: Self-pay | Admitting: Podiatry

## 2022-07-18 NOTE — Telephone Encounter (Signed)
Wife called stating they only received one Rx  and was still needing a prescription for inflammation.   Please advise

## 2022-07-18 NOTE — Telephone Encounter (Signed)
Patient is aware and verbalized understanding to purchase the Voltaren cream OTC.

## 2022-07-18 NOTE — Telephone Encounter (Signed)
Is there an alternative for him to take?

## 2022-07-21 NOTE — Progress Notes (Signed)
Subjective:  Patient ID: Christian Harrison, male    DOB: 1946/01/16,  MRN: [id number]  Chief Complaint  Patient presents with   Routine Post Op    Pt states he has been having throbbing pain in the area near 5th toe on left foot, wants to know if compression socks would help    DOS: June 25, 2022 Procedure: Removal foreign body left forefoot, irrigation debridement of ulceration left plantar forefoot some fifth met head, fifth metatarsal head bone biopsy  76 y.o. male returns for post-op check.  He states he has been doing pretty well since surgery.  He has been wearing a postoperative shoe and walking in that.  He now has some of the redness and swelling on the outside of his left forefoot has gone down.  He is having now some pain just proximal to the surgical site in the left midfoot at the fifth metatarsal base that is new for him.  He is asking for pain medication for this area and not so much the surgical site itself.  Denies any drainage from the wound denies nausea vomiting fever chills.  Review of Systems: Negative except as noted in the HPI. Denies N/V/F/Ch.   Objective:  There were no vitals filed for this visit. There is no height or weight on file to calculate BMI. Constitutional Well developed. Well nourished.  Vascular Foot warm and well perfused. Capillary refill normal to all digits.  Calf is soft and supple, no posterior calf or knee pain, negative Homans' sign  Neurologic Normal speech. Oriented to person, place, and time. Epicritic sensation to light touch grossly present bilaterally.  Dermatologic Skin healing well without signs of infection. Skin edges well coapted without signs of infection at the lateral fifth met head incision.  Still with a shallow ulceration present plantar fifth met head though this is appears improved at this time with decreased depth and healthy granular tissue present wound base minimal hyperkeratotic tissue buildup around the wound.  There  is now decreasing edema and decreasing erythema of the soft tissue lateral to the fifth met head similar to before surgery.  Orthopedic: Tenderness to palpation noted about the surgical site.         Multiple view plain film radiographs:  XR left foot 3 views AP lateral oblique weightbearing Attention directed to the fifth metatarsal head on the AP view there is transverse radiolucency noted running across the head consistent with bone biopsy tract.  On lateral view there is questionable radiolucency of the plantar aspect of the fifth metatarsal head possibly representing early osseous erosion.  Sinus tract associated with wound seen at lateral aspect of the fifth metatarsal head. Assessment:   1. Post-operative state   2. Local infection of skin and subcutaneous tissue   3. Bursitis of left foot   4. Tailor's bunionette, left    Plan:  Patient was evaluated and treated and all questions answered.  S/p foot surgery left foot removal of retained foreign body found to be a small tip of the suture needle likely present from prior surgery in the area.  Additionally irrigation debridement of the wound with bone biopsy fifth metatarsal head. -Concern for possible infection of the fifth metatarsal head with conflicting report of the pathology being negative for osteomyelitis but the culture showing possible growth. -Progressing as expected post-operatively.  Wound at the plantar aspect of the left fifth metatarsal head is improving prior to preoperative with decreased depth and size.  We will continue with  local wound care including daily dressing changes per the patient with antibiotic ointment and dry sterile dressing.  Edema at the lateral fifth met head has decreased. -WB Status: Weightbearing as tolerated in postop shoe -Medications: Recommended meloxicam for pain proximal to the surgical site but patient cannot take this related to other medications he is on.  Recommend Voltaren gel applied  daily to the area. -Foot redressed with antibiotic ointment and dry sterile gauze dressing. -Continue to monitor for worsening of the ulceration or increased edema about the lateral fifth met head.  We will consider repeat MRI if necessary.  Return in about 3 weeks (around 08/07/2022) for POV #3 Left foot.         Everitt Amber, DPM [facility] [place] [facility]

## 2022-08-06 ENCOUNTER — Other Ambulatory Visit (INDEPENDENT_AMBULATORY_CARE_PROVIDER_SITE_OTHER): Payer: Medicare Other | Admitting: Podiatry

## 2022-08-06 DIAGNOSIS — M7752 Other enthesopathy of left foot: Secondary | ICD-10-CM

## 2022-08-06 DIAGNOSIS — Z9889 Other specified postprocedural states: Secondary | ICD-10-CM

## 2022-08-06 NOTE — Telephone Encounter (Signed)
Referral information will be faxed over today.

## 2022-08-06 NOTE — Progress Notes (Signed)
Pt request for neurology consult due to concern for left foot nerve pain.

## 2022-08-06 NOTE — Telephone Encounter (Signed)
Patient  wife called this morning and cancelled appt for 08/07/22 , they want a referral put in for neurology, they think its nerve damage causing the pain? Please advise

## 2022-08-06 NOTE — Telephone Encounter (Signed)
Left message on vm with the number  to [facility].

## 2022-08-07 ENCOUNTER — Encounter: Payer: Medicare Other | Admitting: Podiatry

## 2022-08-18 ENCOUNTER — Telehealth: Payer: Self-pay | Admitting: Podiatry

## 2022-08-18 ENCOUNTER — Ambulatory Visit (INDEPENDENT_AMBULATORY_CARE_PROVIDER_SITE_OTHER): Payer: Medicare Other

## 2022-08-18 ENCOUNTER — Ambulatory Visit (INDEPENDENT_AMBULATORY_CARE_PROVIDER_SITE_OTHER): Payer: Medicare Other | Admitting: Podiatry

## 2022-08-18 DIAGNOSIS — M71072 Abscess of bursa, left ankle and foot: Secondary | ICD-10-CM

## 2022-08-18 DIAGNOSIS — Z9889 Other specified postprocedural states: Secondary | ICD-10-CM

## 2022-08-18 MED ORDER — DOXYCYCLINE HYCLATE 100 MG PO TABS: 100.0000 mg | ORAL_TABLET | Freq: Two times a day (BID) | ORAL | 0 refills | Status: DC

## 2022-08-18 NOTE — Telephone Encounter (Signed)
Patient wife called very upset because her husband left foot near the pinky toe (sx foot), he has been coming back for his follow ups and she feels he is not healing at all. There was a referral send to a neurologist and they stated that they could not see him for the issue he is having. Mrs. Behan wanted to know what to do next. She stated that his foot needs to be treated and evaluated because its getting worse and she needs more than just scraping off. His foot is very swollen near the incision site now and he is unable to get a shoe on his foot.  Please advise

## 2022-08-18 NOTE — Addendum Note (Signed)
Addended by: Myles Rosenthal on: 08/18/2022 04:18 PM   Modules accepted: Orders

## 2022-08-18 NOTE — Progress Notes (Signed)
Chief Complaint  Patient presents with   post op visit    Patient is here for left foot post visit Dos 06/25/22, the patient is still having pain that feels like a hammer is hitting his foot, he has been taking tylenol for pain.    HPI: 76 y.o. male presenting today for follow-up evaluation of left foot swelling and pain with tenderness to the fifth MTP of the left foot.  Patient underwent foreign body removal on 06/25/2022.  He continues to have pain and tenderness with drainage to the plantar aspect of the fifth MTP.  Until today he has been under the care of Dr. Loel Lofty here in our office.  Last seen 07/17/2022.  Currently he is not on any antibiotics.  Past Medical History:  Diagnosis Date   Acid reflux    Asthma 09-08-11   controlled with inhalers   Colon cancer ([place]) 09-08-11   colon ca dx 02/2010. Chemo x 12 sessions   Complication of anesthesia    ? pneumonia after surgery in 2011   COPD (chronic obstructive pulmonary disease) ([place]) 09-08-11   previous smoker   Dysrhythmia 03/2010   PAF after colon resection in the setting of partial SBO -   Hernia    Hiatal hernia    Hypertension    Incisional hernia s/p open [place] repair w mesh [id number] 09/13/2011    Past Surgical History:  Procedure Laterality Date   COLON SURGERY  09-08-11   surgery in 6'11   DG FINGER*L*  09-08-11   left index finger-tendon release   HAND SURGERY     LEFT   PORT-A-CATH REMOVAL  09/10/2011   Procedure: REMOVAL PORT-A-CATH;  Surgeon: Adin Hector, MD;  Location: [facility];  Service: General;  Laterality: N/A;  Removal Port-a-Cath   VENTRAL HERNIA REPAIR  09/10/2011   Procedure: LAPAROSCOPIC VENTRAL HERNIA;  Surgeon: Adin Hector, MD;  Location: [facility];  Service: General;  Laterality: N/A;  Lysis of Adhessions, Converted to open, with Physio Mesh    Allergies  Allergen Reactions   Penicillins Anaphylaxis and Other (See Comments)    Seizures Did it involve swelling of the face/tongue/throat, SOB,  or low BP? Yes Did it involve sudden or severe rash/hives, skin peeling, or any reaction on the inside of your mouth or nose? No Did you need to seek medical attention at a hospital or doctor's office? Yes When did it last happen?    teenager  If all above answers are "NO", may proceed with cephalosporin use.      Physical Exam: General: The patient is alert and oriented x3 in no acute distress.  Dermatology: Open wound noted to the plantar aspect of the fifth MTP left foot with seropurulent drainage.  No malodor.  There is callus around the open wound as well.  Vascular: Erythema with edema and enlargement of the fifth MTP noted left foot.  The erythema and edema is localized to the fifth MTP.  The erythema does not track proximal into the foot ankle or leg.  Clinically there is no concern for vascular compromise  Neurological: Light touch and protective threshold grossly intact  Musculoskeletal Exam: No pedal deformity.  There is enlargement of the fifth MTP with swelling consistent with infection of a bursa to the fifth MTP.  Associated tenderness with palpation.  There is no crepitus with palpation around the fifth MTP  Radiographic Exam LT foot 08/18/2022:  Normal osseous mineralization. Joint spaces preserved.  No erosions that would  be concerning for osteomyelitis.  There is some radiolucency within the soft tissue of the fifth MTP lateral to the fifth metatarsal head but this clinically correlates with fissuring of skin and the open ulcer.  Correlating clinical with radiographic exam this does not appear to demonstrate gas within the tissue  Assessment: 1.  Abscess of bursa fifth MTP left foot 2.  Concern for possible osteomyelitis left foot   Plan of Care:  1. Patient evaluated. X-Rays reviewed.  2.  Cultures taken and sent to pathology for culture and sensitivity 3.  Repeat MRI ordered left foot 4.  Explained to the patient and spouse today that I do believe repeat surgery is  warranted to be more aggressive and open up the fifth MTP and possible fifth metatarsal head resection.  Surgical planning is pending the findings of the MRI results 5.  Betadine ointment applied and provided for the patient to apply to the open wound daily 6.  Prescription for doxycycline 100 mg 2 times daily #20 7.  Return to clinic after MRI to review results and discuss further treatment options     Edrick Kins, DPM [facility]  Dr. Edrick Kins, DPM    [address], [place] [address]                Office [phone]  Fax [phone]

## 2022-08-20 ENCOUNTER — Telehealth: Payer: Self-pay | Admitting: *Deleted

## 2022-08-20 NOTE — Telephone Encounter (Signed)
Patient's wife is calling lab results(wound culture) done on 11/13. Explained to patient that [facility] has not released the results as of yet, will call to check. They only have preliminary ready, rest will be available tomorrow, patient aware.

## 2022-08-22 ENCOUNTER — Telehealth: Payer: Self-pay | Admitting: *Deleted

## 2022-08-22 LAB — WOUND CULTURE
MICRO NUMBER:: 14180536
SPECIMEN QUALITY:: ADEQUATE

## 2022-08-22 NOTE — Telephone Encounter (Signed)
Patient's wife is calling for results from wound culture from 08/18/22,please contact.

## 2022-08-22 NOTE — Telephone Encounter (Signed)
Spoke with patient regarding culture results. MRI scheduled for 09/06/2022. F/u in office after MRI results. - Dr. Amalia Hailey

## 2022-08-25 ENCOUNTER — Telehealth: Payer: Self-pay | Admitting: Podiatry

## 2022-08-25 MED ORDER — OXYCODONE-ACETAMINOPHEN 5-325 MG PO TABS: 1.0000 | ORAL_TABLET | ORAL | 0 refills | Status: DC | PRN

## 2022-08-25 NOTE — Telephone Encounter (Signed)
Pt's wife called to see if Christian Harrison could get a Rx for pain medicine. He is taking OTC meds which are not helping w/ his pain. Please advise.

## 2022-08-26 NOTE — Telephone Encounter (Signed)
Refill sent yesterday by Dr. Posey Pronto. - Dr. Amalia Hailey

## 2022-09-06 ENCOUNTER — Ambulatory Visit
Admission: RE | Admit: 2022-09-06 | Discharge: 2022-09-06 | Disposition: A | Discharge status: Home / Self Care | Payer: Medicare Other | Source: Ambulatory Visit | Attending: Podiatry | Admitting: Podiatry

## 2022-09-06 DIAGNOSIS — M71072 Abscess of bursa, left ankle and foot: Secondary | ICD-10-CM

## 2022-09-10 ENCOUNTER — Ambulatory Visit: Payer: Medicare Other | Admitting: Podiatry

## 2022-09-10 DIAGNOSIS — M71072 Abscess of bursa, left ankle and foot: Secondary | ICD-10-CM | POA: Diagnosis not present

## 2022-09-10 NOTE — Progress Notes (Signed)
Chief Complaint  Patient presents with   MRI    Patient is here for MRI results.    HPI: 76 y.o. male presenting today for follow-up evaluation of left foot swelling and pain with tenderness to the fifth MTP of the left foot.  Patient underwent foreign body removal on 06/25/2022.  He continues to have pain and tenderness with drainage to the plantar aspect of the fifth MTP.  Until today he has been under the care of Dr. Loel Lofty here in our office.  Last seen 07/17/2022.  Currently he is not on any antibiotics.  Past Medical History:  Diagnosis Date   Acid reflux    Asthma 09-08-11   controlled with inhalers   Colon cancer ([place]) 09-08-11   colon ca dx 02/2010. Chemo x 12 sessions   Complication of anesthesia    ? 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 in the setting of partial SBO -   Hernia    Hiatal hernia    Hypertension    Incisional hernia s/p open [place] repair w mesh [id number] 09/13/2011    Past Surgical History:  Procedure Laterality Date   COLON SURGERY  09-08-11   surgery in 6'11   DG FINGER*L*  09-08-11   left index finger-tendon release   HAND SURGERY     LEFT   PORT-A-CATH REMOVAL  09/10/2011   Procedure: REMOVAL PORT-A-CATH;  Surgeon: Adin Hector, MD;  Location: [facility];  Service: General;  Laterality: N/A;  Removal Port-a-Cath   VENTRAL HERNIA REPAIR  09/10/2011   Procedure: LAPAROSCOPIC VENTRAL HERNIA;  Surgeon: Adin Hector, MD;  Location: [facility];  Service: General;  Laterality: N/A;  Lysis of Adhessions, Converted to open, with Physio Mesh    Allergies  Allergen Reactions   Penicillins Anaphylaxis and Other (See Comments)    Seizures Did it involve swelling of the face/tongue/throat, SOB, or low BP? Yes Did it involve sudden or severe rash/hives, skin peeling, or any reaction on the inside of your mouth or nose? No Did you need to  seek medical attention at a hospital or doctor's office? Yes When did it last happen?    teenager  If all above answers are "NO", may proceed with cephalosporin use.      Physical Exam: General: The patient is alert and oriented x3 in no acute distress.  Dermatology: Open wound noted to the plantar aspect of the fifth MTP left foot with seropurulent drainage.  No malodor.  There is callus around the open wound as well.  Vascular: Persistent edema noted fifth MTP left.  The erythema does appear somewhat improved however.  Clinically no concern for vascular compromise  Neurological: Light touch and protective threshold grossly intact  Musculoskeletal Exam: No pedal deformity.  There continues to be enlargement of the fifth MTP with swelling.  The erythema does appear somewhat improved..  Associated tenderness with palpation.  There is no crepitus with palpation around the fifth MTP  Radiographic Exam LT foot 08/18/2022:  Normal osseous mineralization. Joint spaces preserved.  No erosions that would be concerning for osteomyelitis.  There is some radiolucency within the soft tissue of the fifth MTP lateral to the fifth metatarsal head but this clinically correlates with fissuring of skin and the open ulcer.  Correlating clinical with radiographic exam this does not appear to demonstrate gas within the tissue  MR  FOOT LEFT WO CONTRAST 09/06/2022: IMPRESSION: 1. Interval removal of metallic foreign body lateral to the 5th metatarsal neck. 2. Apparent soft tissue ulceration along the plantar aspect of the 5th metatarsal head with underlying ill-defined T2 hyperintensity in the underlying subcutaneous fat suspicious for soft tissue infection. No drainable fluid collection identified. 3. Indeterminate new linear T2 hyperintensity projecting across the central aspect of the 5th metatarsal head with minimal surrounding marrow edema. Although potentially postoperative and not typical for untreated  osteomyelitis, this is in close proximity to the surrounding inflammatory changes described above.  Assessment: 1.  Abscess of bursa fifth MTP left foot 2.  Fifth MTP pain with a prominent fifth metatarsal head left 3.  Underlying ulcer plantar aspect of the fifth MTP left   Plan of Care:  1. Patient evaluated.  MRI reviewed.  2.  The patient's symptoms have not resolved over the past few months.  They are very frustrated.  I do believe repeat surgery would be warranted at this time.  Aggressive surgery will include fifth metatarsal head resection with incision and drainage of the abscess/bursa to the area.  Risk benefits advantages and disadvantages were explained.  No guarantees were expressed or implied.  The patient and spouse both agree that they would like to proceed with a more aggressive surgery to alleviate his symptoms 3.  Authorization for surgery was initiated today.  Surgery will consist of fifth metatarsal head resection left.  Incision and drainage left bursa 4.  Return to clinic 1 week postop   Edrick Kins, DPM [facility]  Dr. Edrick Kins, DPM    [address], [place] [address]                Office [phone]  Fax [phone]

## 2022-09-11 ENCOUNTER — Telehealth: Payer: Self-pay | Admitting: Podiatry

## 2022-09-11 NOTE — Telephone Encounter (Addendum)
DOS: 10/09/2022  [employer] Medicare  Metatarsal Head Resection 5th Lt (37342) Incision & Drainage Lt (10061)  Deductible: $0 Out-of-Pocket: $3,600 with $0 met CoInsurance: 0%  Prior authorization is not required per Charlene Brooke T.  Call Reference #: [id number]  Notification or Prior Authorization is not required for the requested services  Decision ID #:[id number]  The number above acknowledges your inquiry and our response. Please write this number down and refer to it for future inquiries. Coverage and payment for an item or service is governed by the member's benefit plan document, and, if applicable, the provider's participation agreement with the Health Plan.

## 2022-09-25 ENCOUNTER — Telehealth: Payer: Self-pay | Admitting: Podiatry

## 2022-09-25 ENCOUNTER — Other Ambulatory Visit: Payer: Self-pay | Admitting: Podiatry

## 2022-09-25 MED ORDER — DOXYCYCLINE HYCLATE 100 MG PO TABS: 100.0000 mg | ORAL_TABLET | Freq: Two times a day (BID) | ORAL | 0 refills | Status: DC

## 2022-09-25 NOTE — Telephone Encounter (Signed)
Wife stated L foot is infected with a red round circle on the bottom with drainage. Wife was wanting antibiotics for her husband to hold him over till Sx date.  Please advise

## 2022-09-26 NOTE — Telephone Encounter (Signed)
Noted, thanks!

## 2022-10-09 ENCOUNTER — Other Ambulatory Visit: Payer: Self-pay | Admitting: Podiatry

## 2022-10-09 ENCOUNTER — Encounter: Payer: Self-pay | Admitting: Podiatry

## 2022-10-09 DIAGNOSIS — M71072 Abscess of bursa, left ankle and foot: Secondary | ICD-10-CM

## 2022-10-09 DIAGNOSIS — L02416 Cutaneous abscess of left lower limb: Secondary | ICD-10-CM

## 2022-10-09 MED ORDER — OXYCODONE-ACETAMINOPHEN 5-325 MG PO TABS: 1.0000 | ORAL_TABLET | ORAL | 0 refills | Status: DC | PRN

## 2022-10-09 MED ORDER — DOXYCYCLINE HYCLATE 100 MG PO TABS: 100.0000 mg | ORAL_TABLET | Freq: Two times a day (BID) | ORAL | 0 refills | Status: DC

## 2022-10-09 NOTE — Progress Notes (Signed)
PRN postop 

## 2022-10-13 ENCOUNTER — Telehealth: Payer: Self-pay | Admitting: *Deleted

## 2022-10-13 ENCOUNTER — Encounter: Payer: Self-pay | Admitting: Podiatry

## 2022-10-13 NOTE — Telephone Encounter (Signed)
Tried calling patient. No answer. Left voicemail.  Planning to see the patient Wednesday, 10/15/2022.  Thanks, Dr. Amalia Hailey

## 2022-10-13 NOTE — Telephone Encounter (Signed)
Patient's wife is calling requesting the results of soft tissue labs taken at surgery(in epic to view) also wanted the physician to know that she has noticed that the patient's  post procedural leg is a little red, warm,no fever after taking boot off to change pants, please advise.

## 2022-10-15 ENCOUNTER — Ambulatory Visit (INDEPENDENT_AMBULATORY_CARE_PROVIDER_SITE_OTHER): Payer: Medicare Other | Admitting: Podiatry

## 2022-10-15 ENCOUNTER — Ambulatory Visit (INDEPENDENT_AMBULATORY_CARE_PROVIDER_SITE_OTHER): Payer: Medicare Other

## 2022-10-15 DIAGNOSIS — Z9889 Other specified postprocedural states: Secondary | ICD-10-CM

## 2022-10-15 MED ORDER — CIPROFLOXACIN HCL 500 MG PO TABS: 500.0000 mg | ORAL_TABLET | Freq: Two times a day (BID) | ORAL | 0 refills | Status: DC

## 2022-10-15 NOTE — Progress Notes (Signed)
Chief Complaint  Patient presents with   Routine Post Op    POV#1 DOS 01.04.2024 METATARSAL HEAD RES. 5TH AND I&D LT, NO N/V/F/C/SOB, Patient denies any pain, X-Rays taken today     Subjective:  Patient presents today status post incision and drainage with fifth metatarsal head resection and bone biopsy and cultures left foot.  DOS: 10/09/2022.  Patient doing well.  He has been weightbearing as tolerated in the cam boot.  Dressings clean dry and intact.  He presents for further treatment and evaluation  Past Medical History:  Diagnosis Date   Acid reflux    Asthma 09-08-11   controlled with inhalers   Colon cancer ([place]) 09-08-11   colon ca dx 02/2010. Chemo x 12 sessions   Complication of anesthesia    ? pneumonia after surgery in 2011   COPD (chronic obstructive pulmonary disease) ([place]) 09-08-11   previous smoker   Dysrhythmia 03/2010   PAF after colon resection in the setting of partial SBO -   Hernia    Hiatal hernia    Hypertension    Incisional hernia s/p open [place] repair w mesh [id number] 09/13/2011    Past Surgical History:  Procedure Laterality Date   COLON SURGERY  09-08-11   surgery in 6'11   DG FINGER*L*  09-08-11   left index finger-tendon release   HAND SURGERY     LEFT   PORT-A-CATH REMOVAL  09/10/2011   Procedure: REMOVAL PORT-A-CATH;  Surgeon: Adin Hector, MD;  [facility];  Service: General;  Laterality: N/A;  Removal Port-a-Cath   VENTRAL HERNIA REPAIR  09/10/2011   Procedure: LAPAROSCOPIC VENTRAL HERNIA;  Surgeon: Adin Hector, MD;  [facility];  Service: General;  Laterality: N/A;  Lysis of Adhessions, Converted to open, with Physio Mesh    Allergies  Allergen Reactions   Penicillins Anaphylaxis and Other (See Comments)    Seizures Did it involve swelling of the face/tongue/throat, SOB, or low BP? Yes Did it involve sudden or severe rash/hives, skin peeling, or any reaction on the inside of your mouth or nose? No Did you need to seek medical  attention at a hospital or doctor's office? Yes When did it last happen?    teenager  If all above answers are "NO", may proceed with cephalosporin use.      Objective/Physical Exam Neurovascular status intact.  Incision well coapted with sutures and few staples to the proximal portion of the incision intact.  Moderate edema noted to the surgical extremity.  There is some erythema around the incision site and forefoot.  Please see above noted photo.  Radiographic Exam LT foot 10/15/2022:  Absence of the fifth metatarsal head noted with clean osteotomy site.  No gas within the tissues.  There are 3 stainless steel skin staples along the incision site.  Assessment: 1. s/p fifth metatarsal head resection, bone biopsy, cultures with I&D left foot. DOS: 10/09/2022 -Patient evaluated.  X-rays reviewed -Dressings changed -Prescription for ciprofloxacin 500 mg 2 times daily based on culture results.  Culture results under 'Media' tab.  Heavy growth of Enterobacter cloacae complex -Continue minimal WB postsurgical shoe or cam boot  -Return to clinic 1 week   Edrick Kins, DPM [facility]  Dr. Edrick Kins, DPM    [address]  [place], [place] [address]                Office [phone]  Fax [phone]

## 2022-10-16 ENCOUNTER — Telehealth: Payer: Self-pay | Admitting: *Deleted

## 2022-10-16 NOTE — Telephone Encounter (Signed)
error 

## 2022-10-16 NOTE — Telephone Encounter (Signed)
Patient's wife is calling because he has noticed that his left foot is red as fire, warm to the touch,suggested that she take him to ER or Urgent if it continues to gets worse, just started taking the ciprofloxacin prescribed.

## 2022-10-17 NOTE — Telephone Encounter (Signed)
Tried calling patient. No answer. - Dr. Amalia Hailey

## 2022-10-21 ENCOUNTER — Encounter: Payer: Self-pay | Admitting: Podiatry

## 2022-10-21 NOTE — Telephone Encounter (Addendum)
Appointment 10/22/22

## 2022-10-22 ENCOUNTER — Ambulatory Visit (INDEPENDENT_AMBULATORY_CARE_PROVIDER_SITE_OTHER): Payer: Medicare Other | Admitting: Podiatry

## 2022-10-22 DIAGNOSIS — Z9889 Other specified postprocedural states: Secondary | ICD-10-CM

## 2022-10-22 MED ORDER — CIPROFLOXACIN HCL 500 MG PO TABS: 500.0000 mg | ORAL_TABLET | Freq: Two times a day (BID) | ORAL | 0 refills | Status: AC

## 2022-10-22 NOTE — Progress Notes (Signed)
Chief Complaint  Patient presents with   Routine Post Op    POV#2 DOS 01.04.2024 METATARSAL HEAD RES. 5TH AND I&D LT, patient denies any pain, NO N/V/F/C/SOB,     Subjective:  Patient presents today status post incision and drainage with fifth metatarsal head resection and bone biopsy and cultures left foot.  DOS: 10/09/2022.  Patient doing well.  He has been tolerating the Cipro well.  Continues WBAT in the postsurgical shoe  Past Medical History:  Diagnosis Date   Acid reflux    Asthma 09-08-11   controlled with inhalers   Colon cancer ([place]) 09-08-11   colon ca dx 02/2010. Chemo x 12 sessions   Complication of anesthesia    ? 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 colon resection in the setting of partial SBO -   Hernia    Hiatal hernia    Hypertension    Incisional hernia s/p open [place] repair w mesh [id number] 09/13/2011    Past Surgical History:  Procedure Laterality Date   COLON SURGERY  09-08-11   surgery in 6'11   DG FINGER*L*  09-08-11   left index finger-tendon release   HAND SURGERY     LEFT   PORT-A-CATH REMOVAL  09/10/2011   Procedure: REMOVAL PORT-A-CATH;  Surgeon: Adin Hector, MD;  [facility];  Service: General;  Laterality: N/A;  Removal Port-a-Cath   VENTRAL HERNIA REPAIR  09/10/2011   Procedure: LAPAROSCOPIC VENTRAL HERNIA;  Surgeon: Adin Hector, MD;  [facility];  Service: General;  Laterality: N/A;  Lysis of Adhessions, Converted to open, with Physio Mesh    Allergies  Allergen Reactions   Penicillins Anaphylaxis and Other (See Comments)    Seizures Did it involve swelling of the face/tongue/throat, SOB, or low BP? Yes Did it involve sudden or severe rash/hives, skin peeling, or any reaction on the inside of your mouth or nose? No Did you need to seek medical attention at a hospital or doctor's office? Yes When did it last happen?     teenager  If all above answers are "NO", may proceed with cephalosporin use.     Objective/Physical Exam Neurovascular status intact.  Incision well coapted with sutures and few staples to the proximal portion of the incision intact.  Edema and erythema greatly improved.  There is no longer any appreciable erythema or edema around the foot Radiographic Exam LT foot 10/15/2022:  Absence of the fifth metatarsal head noted with clean osteotomy site.  No gas within the tissues.  There are 3 stainless steel skin staples along the incision site.  Assessment: 1. s/p fifth metatarsal head resection, bone biopsy, cultures with I&D left foot. DOS: 10/09/2022 -Patient evaluated.   -Dressings changed.  Recommend Betadine wet-to-dry daily.  Patient has supplies at home - Refill prescription for ciprofloxacin 500 mg 2 times daily based on culture results.  Culture results under 'Media' tab.  Heavy growth of Enterobacter cloacae complex -Continue minimal WB postsurgical shoe or cam boot  -Return to clinic 1 week   Edrick Kins, DPM [facility]  Dr. Edrick Kins, DPM    [address]                                    [place], [place]  [place] [phone]  Fax [phone]

## 2022-10-23 ENCOUNTER — Telehealth: Payer: Self-pay | Admitting: *Deleted

## 2022-10-23 NOTE — Telephone Encounter (Signed)
Called the wife and left message that  patient is able to drive.

## 2022-10-23 NOTE — Telephone Encounter (Signed)
Yes patient can drive. Please notify. - Dr. Amalia Hailey

## 2022-10-23 NOTE — Telephone Encounter (Signed)
Patient's wife is wanting to know if patient can drive, forgot to ask at visit, please advise.

## 2022-10-28 ENCOUNTER — Other Ambulatory Visit: Payer: Self-pay | Admitting: Podiatry

## 2022-10-28 DIAGNOSIS — Z9889 Other specified postprocedural states: Secondary | ICD-10-CM

## 2022-11-10 ENCOUNTER — Ambulatory Visit (INDEPENDENT_AMBULATORY_CARE_PROVIDER_SITE_OTHER): Payer: Medicare Other

## 2022-11-10 ENCOUNTER — Ambulatory Visit (INDEPENDENT_AMBULATORY_CARE_PROVIDER_SITE_OTHER): Payer: Medicare Other | Admitting: Podiatry

## 2022-11-10 DIAGNOSIS — Z9889 Other specified postprocedural states: Secondary | ICD-10-CM

## 2022-11-10 MED ORDER — CIPROFLOXACIN HCL 500 MG PO TABS: 500.0000 mg | ORAL_TABLET | Freq: Two times a day (BID) | ORAL | 0 refills | Status: AC

## 2022-11-10 MED ORDER — DOXYCYCLINE HYCLATE 100 MG PO TABS: 100.0000 mg | ORAL_TABLET | Freq: Two times a day (BID) | ORAL | 0 refills | Status: DC

## 2022-11-10 NOTE — Progress Notes (Signed)
No chief complaint on file.   Subjective:  Patient presents today status post incision and drainage with fifth metatarsal head resection and bone biopsy and cultures left foot.  DOS: 10/09/2022.  Patient doing well.  Patient completed the oral ofloxacin 500 mg 2 times daily.  It was initially prescribed on 10/22/2022 based on cultures and sensitivities.  He has noticed improvement.  Currently VAT surgical shoe.    Past Medical History:  Diagnosis Date   Acid reflux    Asthma 09-08-11   controlled with inhalers   Colon cancer ([place]) 09-08-11   colon ca dx 02/2010. Chemo x 12 sessions   Complication of anesthesia    ? pneumonia after surgery in 2011   COPD (chronic obstructive pulmonary disease) ([place]) 09-08-11   previous smoker   Dysrhythmia 03/2010   PAF after colon resection in the setting of partial SBO -   Hernia    Hiatal hernia    Hypertension    Incisional hernia s/p open [place] repair w mesh [id number] 09/13/2011    Past Surgical History:  Procedure Laterality Date   COLON SURGERY  09-08-11   surgery in 6'11   DG FINGER*L*  09-08-11   left index finger-tendon release   HAND SURGERY     LEFT   PORT-A-CATH REMOVAL  09/10/2011   Procedure: REMOVAL PORT-A-CATH;  Surgeon: Adin Hector, MD;  [facility];  Service: General;  Laterality: N/A;  Removal Port-a-Cath   VENTRAL HERNIA REPAIR  09/10/2011   Procedure: LAPAROSCOPIC VENTRAL HERNIA;  Surgeon: Adin Hector, MD;  [facility];  Service: General;  Laterality: N/A;  Lysis of Adhessions, Converted to open, with Physio Mesh    Allergies  Allergen Reactions   Penicillins Anaphylaxis and Other (See Comments)    Seizures Did it involve swelling of the face/tongue/throat, SOB, or low BP? Yes Did it involve sudden or severe rash/hives, skin peeling, or any reaction on the inside of your mouth or nose? No Did you need to seek medical attention at a hospital or doctor's office? Yes When did it last happen?    teenager  If all  above answers are "NO", may proceed with cephalosporin use.     Objective/Physical Exam Neurovascular status intact.  Sutures and staples intact.  There is some maceration around the incision site.  Minimal erythema and edema.  Radiographic Exam LT foot 10/15/2022:  Absence of the fifth metatarsal head noted with clean osteotomy site.  No gas within the tissues.  There are 3 stainless steel skin staples along the incision site.  Radiographic exam LT foot 11/10/2022: There is some concern around the osteotomy site.  There is some slight irregularity around the osteotomy site and is not a clean osteotomy that was visualized on x-rays 10/15/2022.  Could be postsurgical changes versus residual osteomyelitis of the distal portion of bone.  Will observe for now  Assessment: 1. s/p fifth metatarsal head resection, bone biopsy, cultures with I&D left foot. DOS: 10/09/2022 -Patient evaluated.   - Sutures and staples removed.  Recommend Betadine daily to the incision site. - Refill prescription for ciprofloxacin 500 mg 2 times daily based on culture results x 2 weeks.  Culture results under 'Media' tab.  Heavy growth of Enterobacter cloacae complex -Continue WBAT surgical shoe -Return to clinic 2 weeks   Edrick Kins, DPM [facility]  Dr. Edrick Kins, DPM    [address]  [place], [place] [address]                Office [phone]  Fax [phone]

## 2022-11-24 ENCOUNTER — Ambulatory Visit (INDEPENDENT_AMBULATORY_CARE_PROVIDER_SITE_OTHER): Payer: Medicare Other | Admitting: Podiatry

## 2022-11-24 ENCOUNTER — Ambulatory Visit (INDEPENDENT_AMBULATORY_CARE_PROVIDER_SITE_OTHER): Payer: Medicare Other

## 2022-11-24 ENCOUNTER — Encounter: Payer: Self-pay | Admitting: Podiatry

## 2022-11-24 DIAGNOSIS — M71072 Abscess of bursa, left ankle and foot: Secondary | ICD-10-CM

## 2022-11-24 DIAGNOSIS — Z9889 Other specified postprocedural states: Secondary | ICD-10-CM

## 2022-11-24 NOTE — Progress Notes (Signed)
Chief Complaint  Patient presents with   Routine Post Op    POV# 4 DOS 01.04.2024 METATARSAL HEAD RES. 5TH AND I&D LT  "It hasn't been bothering me"    Subjective:  Patient presents today status post incision and drainage with fifth metatarsal head resection and bone biopsy and cultures left foot.  DOS: 10/09/2022.  Patient doing well.  Patient is WBAT in regular tennis shoes.  He is doing well.  No new complaints  Past Medical History:  Diagnosis Date   Acid reflux    Asthma 09-08-11   controlled with inhalers   Colon cancer ([place]) 09-08-11   colon ca dx 02/2010. Chemo x 12 sessions   Complication of anesthesia    ? pneumonia after surgery in 2011   COPD (chronic obstructive pulmonary disease) ([place]) 09-08-11   previous smoker   Dysrhythmia 03/2010   PAF after colon resection in the setting of partial SBO -   Hernia    Hiatal hernia    Hypertension    Incisional hernia s/p open [place] repair w mesh [id number] 09/13/2011    Past Surgical History:  Procedure Laterality Date   COLON SURGERY  09-08-11   surgery in 6'11   DG FINGER*L*  09-08-11   left index finger-tendon release   HAND SURGERY     LEFT   PORT-A-CATH REMOVAL  09/10/2011   Procedure: REMOVAL PORT-A-CATH;  Surgeon: Adin Hector, MD;  [facility];  Service: General;  Laterality: N/A;  Removal Port-a-Cath   VENTRAL HERNIA REPAIR  09/10/2011   Procedure: LAPAROSCOPIC VENTRAL HERNIA;  Surgeon: Adin Hector, MD;  [facility];  Service: General;  Laterality: N/A;  Lysis of Adhessions, Converted to open, with Physio Mesh    Allergies  Allergen Reactions   Penicillins Anaphylaxis and Other (See Comments)    Seizures Did it involve swelling of the face/tongue/throat, SOB, or low BP? Yes Did it involve sudden or severe rash/hives, skin peeling, or any reaction on the inside of your mouth or nose? No Did you need to seek medical attention at a hospital or doctor's office? Yes When did it last happen?    teenager   If all above answers are "NO", may proceed with cephalosporin use.     Objective/Physical Exam Neurovascular status intact.  Sutures and staples intact.  No maceration.  No erythema or edema noted.  Overall this is a welled healed surgical foot  Radiographic Exam LT foot 10/15/2022:  Absence of the fifth metatarsal head noted with clean osteotomy site.  No gas within the tissues.  There are 3 stainless steel skin staples along the incision site.  Radiographic exam LT foot 11/24/2022: Osteotomy site appears stable with osseous regrowth noted.  Clinically the foot does not appear to be infected and radiographically although there is some concern for irregularity of the osteotomy site I believe this is restructuring.  No additional relevant findings noted  Assessment: 1. s/p fifth metatarsal head resection, bone biopsy, cultures with I&D left foot. DOS: 10/09/2022 -Patient evaluated.   - Patient has completed the oral antibiotics.  He is doing well. -Clinically the foot appears to have healed nicely.  No pain or erythema or edema around the area -Return to clinic 2 months for follow-up x-ray   Edrick Kins, DPM [facility]  Dr. Edrick Kins, DPM    [address]  [place], [place] [address]                Office [phone]  Fax [phone]   

## 2023-03-02 ENCOUNTER — Ambulatory Visit (HOSPITAL_COMMUNITY)
Admission: EM | Admit: 2023-03-02 | Discharge: 2023-03-02 | Disposition: A | Discharge status: Home / Self Care | Payer: Medicare Other | Attending: Emergency Medicine | Admitting: Emergency Medicine

## 2023-03-02 ENCOUNTER — Ambulatory Visit (INDEPENDENT_AMBULATORY_CARE_PROVIDER_SITE_OTHER): Payer: Medicare Other

## 2023-03-02 ENCOUNTER — Other Ambulatory Visit: Payer: Self-pay

## 2023-03-02 ENCOUNTER — Encounter (HOSPITAL_COMMUNITY): Payer: Self-pay | Admitting: Emergency Medicine

## 2023-03-02 DIAGNOSIS — J441 Chronic obstructive pulmonary disease with (acute) exacerbation: Secondary | ICD-10-CM

## 2023-03-02 DIAGNOSIS — R0602 Shortness of breath: Secondary | ICD-10-CM

## 2023-03-02 MED ORDER — IPRATROPIUM-ALBUTEROL 0.5-2.5 (3) MG/3ML IN SOLN: 3.0000 mL | Freq: Four times a day (QID) | RESPIRATORY_TRACT | 0 refills | Status: AC | PRN

## 2023-03-02 MED ORDER — PREDNISONE 20 MG PO TABS: 40.0000 mg | ORAL_TABLET | Freq: Every day | ORAL | 0 refills | Status: AC

## 2023-03-02 MED ORDER — DOXYCYCLINE HYCLATE 100 MG PO CAPS: 100.0000 mg | ORAL_CAPSULE | Freq: Two times a day (BID) | ORAL | 0 refills | Status: AC

## 2023-03-02 MED ORDER — IPRATROPIUM-ALBUTEROL 0.5-2.5 (3) MG/3ML IN SOLN
3.0000 mL | Freq: Once | RESPIRATORY_TRACT | Status: AC
  Administered 2023-03-02: 3 mL via RESPIRATORY_TRACT

## 2023-03-02 MED ORDER — IPRATROPIUM-ALBUTEROL 0.5-2.5 (3) MG/3ML IN SOLN
RESPIRATORY_TRACT | Status: AC
  Filled 2023-03-02: qty 3

## 2023-03-02 NOTE — ED Triage Notes (Signed)
Pt states he is feeling SOB started today.

## 2023-03-02 NOTE — ED Provider Notes (Signed)
[facility]    CSN: [id number] Arrival date & time: 03/02/23  1440      History   Chief Complaint Chief Complaint  Patient presents with   Shortness of Breath    Pt states he is feeling SOB started today.    HPI Christian Harrison is a 77 y.o. male.  Here with 5 day history of productive cough, shortness of breath. Worsening today. Increase in sputum amount, greenish color. Denies fever or chills He has history of COPD but does not use daily inhalers. Tried an albuterol neb this morning that helped a little.  Multiple sick contacts, his wife has been in the hospital with respiratory issue for a week and he's been visiting her  History afib, colon CA, asthma, HTN Has not taken his BP meds in a few days since he's been at the hospital. Usually takes diltiazem and metoprolol daily  Past Medical History:  Diagnosis Date   Acid reflux    Asthma 09-08-11   controlled with inhalers   Colon cancer (HCC) 09-08-11   colon ca dx 02/2010. Chemo x 12 sessions   Complication of anesthesia    ? pneumonia after surgery in 2011   COPD (chronic obstructive pulmonary disease) (HCC) 09-08-11   previous smoker   Dysrhythmia 03/2010   PAF after colon resection in the setting of partial SBO -   Hernia    Hiatal hernia    Hypertension    Incisional hernia s/p open VWH repair w mesh [id number] 09/13/2011    Patient Active Problem List   Diagnosis Date Noted   Atrial fibrillation with RVR (HCC) 01/30/2019   Persistent atrial fibrillation with RVR (HCC)    Chest pain    Cough    SOB (shortness of breath)    Long term (current) use of anticoagulants 12/22/2018   COPD with acute exacerbation (HCC) 12/22/2018   Cardiomyopathy (HCC) 12/22/2018   Hyperlipidemia 10/12/2018   Diarrhea 03/03/2013   History of colon cancer, stage III 11/22/2012   SBO (small bowel obstruction) (HCC) 09/19/2011   Asthma 09/16/2011   Hypertension 09/16/2011   Incisional hernia s/p open VWH repair w mesh [id number]  09/13/2011   Atrial fibrillation (HCC) 09/13/2011    Past Surgical History:  Procedure Laterality Date   COLON SURGERY  09-08-11   surgery in 6'11   DG FINGER*L*  09-08-11   left index finger-tendon release   HAND SURGERY     LEFT   PORT-A-CATH REMOVAL  09/10/2011   Procedure: REMOVAL PORT-A-CATH;  Surgeon: [name], MD;  Location: [facility];  Service: General;  Laterality: N/A;  Removal Port-a-Cath   VENTRAL HERNIA REPAIR  09/10/2011   Procedure: LAPAROSCOPIC VENTRAL HERNIA;  Surgeon: [name], MD;  Location: [facility];  Service: General;  Laterality: N/A;  Lysis of Adhessions, Converted to open, with Physio Mesh       Home Medications    Prior to Admission medications   Medication Sig Start Date End Date Taking? Authorizing Provider  aspirin 81 MG chewable tablet Chew 81 mg by mouth daily. Once daily   Yes Dreshawn Hendershott, Lurena Joiner, PA-C  predniSONE (DELTASONE) 20 MG tablet Take 2 tablets (40 mg total) by mouth daily with breakfast for 5 days. 03/02/23 03/07/23 Yes Jacquese Hackman, Lurena Joiner, PA-C  citalopram (CELEXA) 40 MG tablet Take 20 mg by mouth every evening.     [provider]  diltiazem (CARDIZEM CD) 120 MG 24 hr capsule Take 120 mg by mouth daily. 01/25/20  [provider]  doxycycline (VIBRAMYCIN) 100 MG capsule Take 1 capsule (100 mg total) by mouth 2 (two) times daily for 5 days. 03/02/23 03/07/23 Yes Macallan Ord, Lurena Joiner, PA-C  gabapentin (NEURONTIN) 300 MG capsule Take 1 capsule (300 mg total) by mouth daily after supper. 07/03/22 08/02/22  Standiford, Jenelle Mages, DPM  ipratropium-albuterol (DUONEB) 0.5-2.5 (3) MG/3ML SOLN Take 3 mLs by nebulization every 6 (six) hours as needed. 03/02/23  Yes Liani Caris, Lurena Joiner, PA-C  lovastatin (MEVACOR) 40 MG tablet Take 40 mg by mouth daily. 06/25/22   [provider]  metoprolol tartrate (LOPRESSOR) 50 MG tablet Take 1 tablet (50 mg total) by mouth 2 (two) times daily. 01/31/19   Seawell, Jaimie A, DO  Multiple Vitamins-Minerals  (MULTIVITAMINS THER. W/MINERALS) TABS Take 1 tablet by mouth every evening.     [provider]  oxyCODONE-acetaminophen (PERCOCET) 5-325 MG tablet Take 1 tablet by mouth every 4 (four) hours as needed for severe pain. 10/09/22   Felecia Shelling, DPM  rosuvastatin (CRESTOR) 10 MG tablet Take 10 mg by mouth daily. 04/23/22   [provider]  traZODone (DESYREL) 150 MG tablet Take 150 mg by mouth at bedtime as needed for sleep.    [provider]  vitamin A 8000 UNIT capsule Take 8,000 Units by mouth daily.     [provider]  vitamin E 400 UNIT capsule Take 400 Units by mouth daily.     [provider]    Family History Family History  Problem Relation Age of Onset   Heart disease Mother    Allergic rhinitis Neg Hx    Asthma Neg Hx     Social History Social History   Tobacco Use   Smoking status: Former    Years: 1    Types: Cigarettes    Quit date: 09/18/1970    Years since quitting: 52.4   Smokeless tobacco: Never  Substance Use Topics   Alcohol use: No   Drug use: No     Allergies   Penicillins   Review of Systems Review of Systems  Respiratory:  Positive for shortness of breath.    As per HPI  Physical Exam Triage Vital Signs ED Triage Vitals  Enc Vitals Group     BP 03/02/23 1446 (!) 195/93     Pulse Rate 03/02/23 1446 98     Resp 03/02/23 1446 (!) 25     Temp 03/02/23 1446 98.6 F (37 C)     Temp Source 03/02/23 1446 Oral     SpO2 03/02/23 1446 96 %     Weight 03/02/23 1447 207 lb 3.7 oz (94 kg)     Height 03/02/23 1447 5\' 11"  (1.803 m)     Head Circumference --      Peak Flow --      Pain Score 03/02/23 1447 0     Pain Loc --      Pain Edu? --      Excl. in GC? --    No data found.  Updated Vital Signs BP (!) 189/72   Pulse 98   Temp 98.6 F (37 C) (Oral)   Resp 16   Ht 5\' 11"  (1.803 m)   Wt 207 lb 3.7 oz (94 kg)   SpO2 97%   BMI 28.90 kg/m   Physical Exam Vitals and nursing note reviewed.   Constitutional:      Appearance: He is not ill-appearing or diaphoretic.     Comments: Increased work of breathing before  treatment. Can speak in partial sentences but tachypneic. Answers questions appropriately   HENT:     Nose: No rhinorrhea.     Mouth/Throat:     Mouth: Mucous membranes are moist.     Pharynx: Oropharynx is clear. No posterior oropharyngeal erythema.  Eyes:     Conjunctiva/sclera: Conjunctivae normal.  Cardiovascular:     Rate and Rhythm: Normal rate and regular rhythm.     Pulses: Normal pulses.     Heart sounds: Normal heart sounds.  Pulmonary:     Effort: Tachypnea present.     Comments: Crackles in left lobe. Decreased sounds throughout but moving air Musculoskeletal:     Cervical back: Normal range of motion.  Lymphadenopathy:     Cervical: No cervical adenopathy.  Skin:    General: Skin is warm and dry.  Neurological:     Mental Status: He is alert and oriented to person, place, and time.     UC Treatments / Results  Labs (all labs ordered are listed, but only abnormal results are displayed) Labs Reviewed - No data to display  EKG   Radiology DG Chest 2 View  Result Date: 03/02/2023 CLINICAL DATA:  Shortness of breath beginning today.  Cough.  COPD. EXAM: CHEST - 2 VIEW COMPARISON:  None Available. FINDINGS: The heart size and mediastinal contours are within normal limits. Pulmonary hyperinflation again seen, consistent with COPD. Both lungs are clear. The visualized skeletal structures are unremarkable. IMPRESSION: COPD. No active cardiopulmonary disease. Electronically Signed   By: Danae Orleans M.D.   On: 03/02/2023 15:56    Procedures Procedures (including critical care time)  Medications Ordered in UC Medications  ipratropium-albuterol (DUONEB) 0.5-2.5 (3) MG/3ML nebulizer solution 3 mL (3 mLs Nebulization Given 03/02/23 1526)    Initial Impression / Assessment and Plan / UC Course  I have reviewed the triage vital signs and the nursing  notes.  Pertinent labs & imaging results that were available during my care of the patient were reviewed by me and considered in my medical decision making (see chart for details).  Initial appearance he was tachypneic and increased effort to breathe. DuoNeb given. Patient reports feeling much improved, easier to breathe and less tightness in the lungs. Remained 95-97% RA throughout visit. He is hypertensive but to be expected given no medications for a few days and his acute distress.  Chest xray is negative for acute abnormality. Images independently reviewed by me, agree with radiology interpretation.  Suspect COPD exacerbation. Treat with doxy BID x 5 days, duoneb treatments q6 hours for several days, 40 mg prednisone daily x 5 days Strict ED precautions were discussed. Patient verbalizes understanding. Recommend follow up with his primary as well  Final Clinical Impressions(s) / UC Diagnoses   Final diagnoses:  COPD exacerbation (HCC)  SOB (shortness of breath)     Discharge Instructions      I am treating you for COPD exacerbation Please take antibiotic (doxycycline) as prescribed. Take with food to avoid upset stomach.  Please use your nebulizer 3x daily (every 6 hours) for the next several days. I have sent in a refill of the liquid.  Starting tomorrow morning, take prednisone 40 mg daily for 5 days.   Please go to the emergency department if symptoms worsen, especially if you have worsening shortness of breath or develop fever.   Continue to take your blood pressure medications EVERY DAY as prescribed. Follow up with your primary care provider sometime in the next week or two  for evaluation of hypertension and medication management.      ED Prescriptions     Medication Sig Dispense Auth. Provider   doxycycline (VIBRAMYCIN) 100 MG capsule Take 1 capsule (100 mg total) by mouth 2 (two) times daily for 5 days. 10 capsule Nakyla Bracco, PA-C   ipratropium-albuterol  (DUONEB) 0.5-2.5 (3) MG/3ML SOLN Take 3 mLs by nebulization every 6 (six) hours as needed. 360 mL Tere Mcconaughey, PA-C   predniSONE (DELTASONE) 20 MG tablet Take 2 tablets (40 mg total) by mouth daily with breakfast for 5 days. 10 tablet Gimena Buick, Lurena Joiner, PA-C      PDMP not reviewed this encounter.   Oral Hallgren, Ray Church 03/02/23 2009

## 2023-03-02 NOTE — Discharge Instructions (Addendum)
I am treating you for COPD exacerbation Please take antibiotic (doxycycline) as prescribed. Take with food to avoid upset stomach.  Please use your nebulizer 3x daily (every 6 hours) for the next several days. I have sent in a refill of the liquid.  Starting tomorrow morning, take prednisone 40 mg daily for 5 days.   Please go to the emergency department if symptoms worsen, especially if you have worsening shortness of breath or develop fever.   Continue to take your blood pressure medications EVERY DAY as prescribed. Follow up with your primary care provider sometime in the next week or two for evaluation of hypertension and medication management.

## 2023-04-29 ENCOUNTER — Ambulatory Visit (INDEPENDENT_AMBULATORY_CARE_PROVIDER_SITE_OTHER): Payer: Medicare Other

## 2023-04-29 ENCOUNTER — Ambulatory Visit: Payer: Medicare Other | Admitting: Podiatry

## 2023-04-29 DIAGNOSIS — T451X5A Adverse effect of antineoplastic and immunosuppressive drugs, initial encounter: Secondary | ICD-10-CM

## 2023-04-29 DIAGNOSIS — L03115 Cellulitis of right lower limb: Secondary | ICD-10-CM | POA: Diagnosis not present

## 2023-04-29 DIAGNOSIS — G620 Drug-induced polyneuropathy: Secondary | ICD-10-CM

## 2023-04-29 DIAGNOSIS — L97512 Non-pressure chronic ulcer of other part of right foot with fat layer exposed: Secondary | ICD-10-CM

## 2023-04-29 MED ORDER — GENTAMICIN SULFATE 0.1 % EX OINT: 1.0000 | TOPICAL_OINTMENT | Freq: Two times a day (BID) | CUTANEOUS | 1 refills | Status: DC

## 2023-04-29 MED ORDER — SULFAMETHOXAZOLE-TRIMETHOPRIM 800-160 MG PO TABS: 1.0000 | ORAL_TABLET | Freq: Two times a day (BID) | ORAL | 0 refills | Status: DC

## 2023-04-29 NOTE — Progress Notes (Signed)
Chief Complaint  Patient presents with   Foot Ulcer    Blister started on lateral foot Friday and started doxycycline on Saturday. No fevers or chills.    Arthritis    Subjective:  77 y.o. 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  Patient states that about a week ago he noticed redness and swelling to the right foot.  He does admit to walking outside barefoot.  Presenting for further treatment evaluation   Past Medical History:  Diagnosis Date   Acid reflux    Asthma 09-08-11   controlled with inhalers   Colon cancer (HCC) 09-08-11   colon ca dx 02/2010. Chemo x 12 sessions   Complication of anesthesia    ? pneumonia after surgery in 2011   COPD (chronic obstructive pulmonary disease) (HCC) 09-08-11   previous smoker   Dysrhythmia 03/2010   PAF after colon resection in the setting of partial SBO -   Hernia    Hiatal hernia    Hypertension    Incisional hernia s/p open VWH repair w mesh [id number] 09/13/2011    Past Surgical History:  Procedure Laterality Date   COLON SURGERY  09-08-11   surgery in 6'11   DG FINGER*L*  09-08-11   left index finger-tendon release   HAND SURGERY     LEFT   PORT-A-CATH REMOVAL  09/10/2011   Procedure: REMOVAL PORT-A-CATH;  Surgeon: [name], MD;  Location: [facility];  Service: General;  Laterality: N/A;  Removal Port-a-Cath   VENTRAL HERNIA REPAIR  09/10/2011   Procedure: LAPAROSCOPIC VENTRAL HERNIA;  Surgeon: [name], MD;  Location: [facility];  Service: General;  Laterality: N/A;  Lysis of Adhessions, Converted to open, with Physio Mesh    Allergies  Allergen Reactions   Penicillins Anaphylaxis and Other (See Comments)    Seizures Did it involve swelling of the face/tongue/throat, SOB, or low BP? Yes Did it involve sudden or severe rash/hives, skin peeling, or any reaction on the inside of your mouth or nose? No Did you need to seek  medical attention at a hospital or doctor's office? Yes When did it last happen?    teenager  If all above answers are "NO", may proceed with cephalosporin use.      RT foot postdebridement 04/29/2023  RT foot predebridement 04/29/2023  Objective/Physical Exam General: The patient is alert and oriented x3 in no acute distress.  Dermatology:  Purulent serous large blister noted approximately 3 cm in diameter with loosely adhered skin to the lateral aspect of the right foot.  After debridement there is an underlying wound as well as a rose thorn approximately 4 mm in length.  The dark lesion on the predebridement picture above is the rose thorn.  Wound #1 noted to the lateral aspect of the right foot with overlying blister.  The actual ulcer measures approximately 0.6 and 0.6 and 0.2 cm (LxWxD).   To the noted ulceration(s), there is no eschar. There is a moderate amount of slough, fibrin, and necrotic tissue noted. Granulation tissue and wound base is red. There is a minimal amount of serosanguineous drainage noted. There is no exposed bone muscle-tendon ligament or joint. There is no malodor. Periwound integrity is intact. Skin is warm, dry and supple bilateral lower extremities.  Vascular: Erythema with edema noted encompassing the right foot  Neurological: Light touch and protective threshold diminished bilaterally.   Musculoskeletal Exam: Range of  motion within normal limits to all pedal and ankle joints bilateral. Muscle strength 5/5 in all groups bilateral.  No prior amputation.  History of left foot surgery.  Patient ambulatory  Radiographic exam RT foot 04/29/2023: Normal osseous mineralization.  No acute fractures identified.  No gas within the tissue.  Impression: Negative  Assessment: 1.  Ulcer lateral aspect of the right foot 2.  Peripheral neuropathy secondary to chemotherapy 3.  Cellulitis right foot secondary to rose thorn approximately 4 mm in length   Plan of Care:   1. Patient was evaluated.  The overlying blister was deroofed and debrided today. 2. medically necessary excisional debridement including subcutaneous tissue was performed using a tissue nipper and a chisel blade. Excisional debridement of all the necrotic nonviable tissue down to healthy bleeding viable tissue was performed with post-debridement measurements same as pre-.  The thorn was removed 3. the wound was cleansed and dry sterile dressing applied. 4.  Prescription for gentamicin ointment applied twice daily with a Band-Aid 5.  Continue doxycycline as prescribed 6.  In addition to the doxycycline prescription for Bactrim DS #20 BID 7.  Return to clinic 2 weeks   Felecia Shelling, DPM [facility]  Dr. Felecia Shelling, DPM    [address] [place], [place] [address]                Office [phone]  Fax [phone]

## 2023-05-18 ENCOUNTER — Ambulatory Visit: Payer: Medicare Other | Admitting: Podiatry

## 2023-05-18 ENCOUNTER — Ambulatory Visit (INDEPENDENT_AMBULATORY_CARE_PROVIDER_SITE_OTHER): Payer: Medicare Other

## 2023-05-18 DIAGNOSIS — L97512 Non-pressure chronic ulcer of other part of right foot with fat layer exposed: Secondary | ICD-10-CM

## 2023-05-18 DIAGNOSIS — M79671 Pain in right foot: Secondary | ICD-10-CM

## 2023-05-18 DIAGNOSIS — M79672 Pain in left foot: Secondary | ICD-10-CM

## 2023-05-18 NOTE — Progress Notes (Signed)
Chief Complaint  Patient presents with    cellulitis,right foot    Subjective:  77 y.o. 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  This is secondary to a rose thorn.  He completed the antibiotics without complication.  He is no longer on antibiotics.  Doing well.   Past Medical History:  Diagnosis Date   Acid reflux    Asthma 09-08-11   controlled with inhalers   Colon cancer (HCC) 09-08-11   colon ca dx 02/2010. Chemo x 12 sessions   Complication of anesthesia    ? pneumonia after surgery in 2011   COPD (chronic obstructive pulmonary disease) (HCC) 09-08-11   previous smoker   Dysrhythmia 03/2010   PAF after colon resection in the setting of partial SBO -   Hernia    Hiatal hernia    Hypertension    Incisional hernia s/p open VWH repair w mesh [id number] 09/13/2011    Past Surgical History:  Procedure Laterality Date   COLON SURGERY  09-08-11   surgery in 6'11   DG FINGER*L*  09-08-11   left index finger-tendon release   HAND SURGERY     LEFT   PORT-A-CATH REMOVAL  09/10/2011   Procedure: REMOVAL PORT-A-CATH;  Surgeon: [name], MD;  Location: [facility];  Service: General;  Laterality: N/A;  Removal Port-a-Cath   VENTRAL HERNIA REPAIR  09/10/2011   Procedure: LAPAROSCOPIC VENTRAL HERNIA;  Surgeon: [name], MD;  Location: [facility];  Service: General;  Laterality: N/A;  Lysis of Adhessions, Converted to open, with Physio Mesh    Allergies  Allergen Reactions   Penicillins Anaphylaxis and Other (See Comments)    Seizures Did it involve swelling of the face/tongue/throat, SOB, or low BP? Yes Did it involve sudden or severe rash/hives, skin peeling, or any reaction on the inside of your mouth or nose? No Did you need to seek medical attention at a hospital or doctor's office? Yes When did it last happen?    teenager  If all above  answers are "NO", may proceed with cephalosporin use.      RT foot postdebridement 04/29/2023  RT foot predebridement 04/29/2023  Objective/Physical Exam General: The patient is alert and oriented x3 in no acute distress.  Dermatology:  The blister and underlying ulcerative healed and resolved completely.  There is some callus tissue around the area but after debridement there is healthy underlying skin.  Vascular: Erythema with edema noted encompassing the right foot  Neurological: Light touch and protective threshold diminished bilaterally.   Musculoskeletal Exam: Range of motion within normal limits to all pedal and ankle joints bilateral. Muscle strength 5/5 in all groups bilateral.  No prior amputation.  History of left foot surgery.  Patient ambulatory  Radiographic exam RT foot 05/18/2023: Unchanged.  Normal osseous mineralization.  No acute fractures identified.  No gas within the tissue.  Impression: Negative  Assessment: 1.  Ulcer lateral aspect of the right foot 2.  Peripheral neuropathy secondary to chemotherapy 3.  Cellulitis right foot secondary to rose thorn approximately 4 mm in length   Plan of Care:  -Patient was evaluated.   -Light debridement of the area was performed today.  After debridement of the callus there is healthy underlying skin.  The ulcer is completely resolved and healed -There is no indication of erythema or edema -Stressed the importance of not going  barefoot.  Recommend good supportive shoes at all times -Return to clinic as needed   Felecia Shelling, DPM [facility]  Dr. Felecia Shelling, DPM    [address] [place], [place] [address]                Office [phone]  Fax [phone]

## 2023-05-25 ENCOUNTER — Ambulatory Visit: Payer: Medicare Other | Admitting: Podiatry

## 2023-10-22 ENCOUNTER — Telehealth: Payer: Self-pay

## 2023-10-22 NOTE — Telephone Encounter (Addendum)
Called patient - DOB verified - stated he has been having the following symptoms on/off for a long time but has become worsen due to the cold weather the last 2-3 weeks::  Some shortness of breath Cough - white  Patient denies: Fever Bodyaches Chest pain Blueness of lips - patient stated he did not know why his wife said that!!  Patient stated he did a nebulizer treatment 3-4 days ago and feeling better only gets winded when he's lifting/picking up something or like walking to the mailbox.  Patient declined once again to be seen today, Thursday, 10/22/23 @ 1:30 pm - stated he's okay and wanted to wait to see Dr Lucie Leather next week - 10/27/23 @ 2:30 pm.  Patient was advised to call 911 if his symptoms worsen or be seen at the nearest UC/ER for evaluation and treatment if needed.  Patient verbalized understanding to all, no questions.  Forwarding message to provider as update.

## 2023-10-27 ENCOUNTER — Ambulatory Visit: Payer: Medicare Other | Admitting: Allergy and Immunology

## 2023-11-17 ENCOUNTER — Ambulatory Visit: Payer: Medicare Other | Admitting: Allergy and Immunology

## 2024-06-01 NOTE — Progress Notes (Signed)
  Subjective  Patient ID: Christian Harrison is a 78 y.o. male being seen for:  Chief Complaint  Patient presents with  . Otitis Media     HPI   78 year old male with a severe right otitis externa when last seen 2 weeks ago.  He had impacted cerumen debris and surrounding infection and so he put on antibiotic and steroid eardrops.  He reports that the pain in the right ear is significantly improved since then.    Review of Systems: all relevant systems have been reviewed unless otherwise documented.  Medical History[1]  Surgical History[2]  Family History[3]  Allergies[4]   Objective  Physical Exam: General/Constitutional: Patient is a well-nourished, well-developed in no distress. Answers questions appropriately.  Skin/scalp : Normal and without lesions. No rashes, ulcerations or masses noted.  Head: No facial deformities  Eyes: Vision grossly intact. Normal extraocular movements. No nystagmus noted.  Ears: Right ear: The right ear canal had mild edema, excessive entrapped ear hairs limiting evaluation and cerumen impaction deep to this.  Normal tympanic membrane. Left ear: Impacted cerumen and excessive hairs. Normal tympanic membrane.  Nose: Septum midline, turbinates slightly enlarged  Oral cavity and oropharynx: No concerning lesions in the oral cavity or pharynx.  Neck: No palpable masses or lesions  Ear Cerumen Removal  Date/Time: 06/01/2024 3:15 PM  Performed by: Arthea Maude Fries, MD Authorized by: Arthea Maude Fries, MD  Comments: The right ear was visualized under the microscope and excessive foreign body hairs and obstructing cerumen was removed using suction and an alligator forcep.     Assessment/Plan  1. Impacted cerumen of right ear (Primary) Right cerumen impaction was removed on the microscope today.  There is a significant impaction on the left side which is not symptomatic from at this time.  We discussed the use of Debrox and/or peroxide  to clean this.  2. Other infective acute otitis externa of right ear The right otitis externa has resolved.  We discussed that he can discontinue drops at this time.  3. Foreign body of right ear, initial encounter Significant entrapped hairs were removed under the microscope from the right ear today.  He also has excessive hair in the left ear which was unable to be cleaned due to patient intolerance however he is not significantly symptomatic this time.     No orders of the defined types were placed in this encounter.   No follow-ups on file.   Electronically signed by: Arthea Fries, MD 06/01/2024 4:02 PM        [1] Past Medical History: Diagnosis Date  . Hyperlipemia   . Hypertension   . Prediabetes   [2] History reviewed. No pertinent surgical history. [3] No family history on file. [4] Allergies Allergen Reactions  . Penicillins Anaphylaxis and Other (See Comments)    Seizures  Did it involve swelling of the face/tongue/throat, SOB, or low BP? Yes  Did it involve sudden or severe rash/hives, skin peeling, or any reaction on the inside of your mouth or nose? No  Did you need to seek medical attention at a hospital or doctor's office? Yes  When did it last happen?    teenager   If all above answers are NO, may proceed with cephalosporin use.

## 2024-09-03 ENCOUNTER — Emergency Department (HOSPITAL_COMMUNITY)

## 2024-09-03 ENCOUNTER — Other Ambulatory Visit: Payer: Self-pay

## 2024-09-03 ENCOUNTER — Encounter (HOSPITAL_COMMUNITY): Payer: Self-pay | Admitting: *Deleted

## 2024-09-03 ENCOUNTER — Inpatient Hospital Stay (HOSPITAL_COMMUNITY)
Admission: EM | Admit: 2024-09-03 | Discharge: 2024-09-05 | DRG: 194 | Disposition: A | Attending: Internal Medicine | Admitting: Internal Medicine

## 2024-09-03 DIAGNOSIS — E782 Mixed hyperlipidemia: Secondary | ICD-10-CM

## 2024-09-03 DIAGNOSIS — I110 Hypertensive heart disease with heart failure: Secondary | ICD-10-CM | POA: Diagnosis present

## 2024-09-03 DIAGNOSIS — Z7901 Long term (current) use of anticoagulants: Secondary | ICD-10-CM

## 2024-09-03 DIAGNOSIS — E663 Overweight: Secondary | ICD-10-CM | POA: Diagnosis present

## 2024-09-03 DIAGNOSIS — F39 Unspecified mood [affective] disorder: Secondary | ICD-10-CM | POA: Diagnosis present

## 2024-09-03 DIAGNOSIS — I5022 Chronic systolic (congestive) heart failure: Secondary | ICD-10-CM | POA: Diagnosis present

## 2024-09-03 DIAGNOSIS — I10 Essential (primary) hypertension: Secondary | ICD-10-CM | POA: Diagnosis present

## 2024-09-03 DIAGNOSIS — E785 Hyperlipidemia, unspecified: Secondary | ICD-10-CM | POA: Diagnosis present

## 2024-09-03 DIAGNOSIS — Z880 Allergy status to penicillin: Secondary | ICD-10-CM

## 2024-09-03 DIAGNOSIS — Z79899 Other long term (current) drug therapy: Secondary | ICD-10-CM

## 2024-09-03 DIAGNOSIS — J189 Pneumonia, unspecified organism: Principal | ICD-10-CM | POA: Diagnosis present

## 2024-09-03 DIAGNOSIS — J449 Chronic obstructive pulmonary disease, unspecified: Secondary | ICD-10-CM | POA: Diagnosis present

## 2024-09-03 DIAGNOSIS — I480 Paroxysmal atrial fibrillation: Secondary | ICD-10-CM | POA: Diagnosis present

## 2024-09-03 DIAGNOSIS — J440 Chronic obstructive pulmonary disease with acute lower respiratory infection: Secondary | ICD-10-CM | POA: Diagnosis present

## 2024-09-03 DIAGNOSIS — Z792 Long term (current) use of antibiotics: Secondary | ICD-10-CM

## 2024-09-03 DIAGNOSIS — Z6828 Body mass index (BMI) 28.0-28.9, adult: Secondary | ICD-10-CM

## 2024-09-03 DIAGNOSIS — Z8249 Family history of ischemic heart disease and other diseases of the circulatory system: Secondary | ICD-10-CM

## 2024-09-03 DIAGNOSIS — Z85038 Personal history of other malignant neoplasm of large intestine: Secondary | ICD-10-CM

## 2024-09-03 DIAGNOSIS — Z87891 Personal history of nicotine dependence: Secondary | ICD-10-CM

## 2024-09-03 DIAGNOSIS — I429 Cardiomyopathy, unspecified: Secondary | ICD-10-CM | POA: Diagnosis present

## 2024-09-03 DIAGNOSIS — Z1152 Encounter for screening for COVID-19: Secondary | ICD-10-CM

## 2024-09-03 DIAGNOSIS — R739 Hyperglycemia, unspecified: Secondary | ICD-10-CM | POA: Diagnosis present

## 2024-09-03 DIAGNOSIS — Z7982 Long term (current) use of aspirin: Secondary | ICD-10-CM

## 2024-09-03 LAB — BASIC METABOLIC PANEL WITH GFR
Anion gap: 13 (ref 5–15)
BUN: 15 mg/dL (ref 8–23)
CO2: 21 mmol/L — ABNORMAL LOW (ref 22–32)
Calcium: 8.8 mg/dL — ABNORMAL LOW (ref 8.9–10.3)
Chloride: 101 mmol/L (ref 98–111)
Creatinine, Ser: 1.01 mg/dL (ref 0.61–1.24)
GFR, Estimated: >60 mL/min (ref >60)
Glucose, Bld: 205 mg/dL — ABNORMAL HIGH (ref 70–99)
Potassium: 3.6 mmol/L (ref 3.5–5.1)
Sodium: 135 mmol/L (ref 135–145)

## 2024-09-03 LAB — TROPONIN I (HIGH SENSITIVITY)
Troponin I (High Sensitivity): 16 ng/L (ref <18)
Troponin I (High Sensitivity): 18 ng/L — ABNORMAL HIGH (ref <18)

## 2024-09-03 LAB — CBC
HCT: 42.7 % (ref 39.0–52.0)
Hemoglobin: 15.1 g/dL (ref 13.0–17.0)
MCH: 32.9 pg (ref 26.0–34.0)
MCHC: 35.4 g/dL (ref 30.0–36.0)
MCV: 93 fL (ref 80.0–100.0)
Platelets: 208 K/uL (ref 150–400)
RBC: 4.59 MIL/uL (ref 4.22–5.81)
RDW: 12.4 % (ref 11.5–15.5)
WBC: 20.4 K/uL — ABNORMAL HIGH (ref 4.0–10.5)
nRBC: 0 % (ref 0.0–0.2)

## 2024-09-03 LAB — I-STAT CG4 LACTIC ACID, ED
Lactic Acid, Venous: 2.3 mmol/L (ref 0.5–1.9)
Lactic Acid, Venous: 1.3 mmol/L (ref 0.5–1.9)

## 2024-09-03 LAB — RESP PANEL BY RT-PCR (RSV, FLU A&B, COVID)  RVPGX2
Influenza A by PCR: NEGATIVE
Influenza B by PCR: NEGATIVE
Resp Syncytial Virus by PCR: NEGATIVE
SARS Coronavirus 2 by RT PCR: NEGATIVE

## 2024-09-03 MED ORDER — PROCHLORPERAZINE EDISYLATE 10 MG/2ML IJ SOLN: 5.0000 mg | Freq: Four times a day (QID) | INTRAMUSCULAR | Status: DC | PRN

## 2024-09-03 MED ORDER — LEVOFLOXACIN IN D5W 750 MG/150ML IV SOLN
750.0000 mg | Freq: Once | INTRAVENOUS | Status: AC
  Administered 2024-09-03: 750 mg via INTRAVENOUS
  Filled 2024-09-03: qty 150

## 2024-09-03 MED ORDER — ACETAMINOPHEN 325 MG PO TABS: 650.0000 mg | ORAL_TABLET | Freq: Four times a day (QID) | ORAL | Status: DC | PRN

## 2024-09-03 MED ORDER — IPRATROPIUM-ALBUTEROL 0.5-2.5 (3) MG/3ML IN SOLN
3.0000 mL | RESPIRATORY_TRACT | Status: DC | PRN
  Administered 2024-09-03: 3 mL via RESPIRATORY_TRACT
  Filled 2024-09-03: qty 3

## 2024-09-03 MED ORDER — RIVAROXABAN 20 MG PO TABS
20.0000 mg | ORAL_TABLET | Freq: Every day | ORAL | Status: DC
  Administered 2024-09-03 – 2024-09-04 (×2): 20 mg via ORAL
  Filled 2024-09-03: qty 2
  Filled 2024-09-03: qty 1

## 2024-09-03 MED ORDER — INSULIN ASPART 100 UNIT/ML IJ SOLN
0.0000 [IU] | Freq: Three times a day (TID) | INTRAMUSCULAR | Status: DC
  Administered 2024-09-04: 3 [IU] via SUBCUTANEOUS
  Filled 2024-09-03: qty 3

## 2024-09-03 MED ORDER — OXYCODONE HCL 5 MG PO TABS: 2.5000 mg | ORAL_TABLET | ORAL | Status: DC | PRN

## 2024-09-03 MED ORDER — POTASSIUM CHLORIDE CRYS ER 20 MEQ PO TBCR
20.0000 meq | EXTENDED_RELEASE_TABLET | Freq: Once | ORAL | Status: AC
  Administered 2024-09-03: 20 meq via ORAL
  Filled 2024-09-03: qty 1

## 2024-09-03 MED ORDER — ROSUVASTATIN CALCIUM 5 MG PO TABS
10.0000 mg | ORAL_TABLET | Freq: Every day | ORAL | Status: DC
  Administered 2024-09-04 – 2024-09-05 (×2): 10 mg via ORAL
  Filled 2024-09-03 (×2): qty 2

## 2024-09-03 MED ORDER — ACETAMINOPHEN 650 MG RE SUPP: 650.0000 mg | Freq: Four times a day (QID) | RECTAL | Status: DC | PRN

## 2024-09-03 MED ORDER — AZITHROMYCIN 250 MG PO TABS
500.0000 mg | ORAL_TABLET | Freq: Every day | ORAL | Status: DC
  Administered 2024-09-04 – 2024-09-05 (×2): 500 mg via ORAL
  Filled 2024-09-03 (×2): qty 2

## 2024-09-03 MED ORDER — TRAZODONE HCL 50 MG PO TABS
150.0000 mg | ORAL_TABLET | Freq: Every evening | ORAL | Status: DC | PRN
  Administered 2024-09-04: 150 mg via ORAL
  Filled 2024-09-03: qty 1

## 2024-09-03 MED ORDER — SODIUM CHLORIDE 0.9% FLUSH
3.0000 mL | Freq: Two times a day (BID) | INTRAVENOUS | Status: DC
  Administered 2024-09-04 – 2024-09-05 (×3): 3 mL via INTRAVENOUS

## 2024-09-03 MED ORDER — CITALOPRAM HYDROBROMIDE 20 MG PO TABS
20.0000 mg | ORAL_TABLET | Freq: Every evening | ORAL | Status: DC
  Administered 2024-09-03 – 2024-09-04 (×2): 20 mg via ORAL
  Filled 2024-09-03: qty 2
  Filled 2024-09-03: qty 1

## 2024-09-03 MED ORDER — DILTIAZEM HCL ER COATED BEADS 120 MG PO CP24
120.0000 mg | ORAL_CAPSULE | Freq: Every day | ORAL | Status: DC
  Administered 2024-09-04 – 2024-09-05 (×2): 120 mg via ORAL
  Filled 2024-09-03 (×2): qty 1

## 2024-09-03 MED ORDER — SENNOSIDES-DOCUSATE SODIUM 8.6-50 MG PO TABS: 1.0000 | ORAL_TABLET | Freq: Every evening | ORAL | Status: DC | PRN

## 2024-09-03 MED ORDER — METOPROLOL TARTRATE 50 MG PO TABS
50.0000 mg | ORAL_TABLET | Freq: Two times a day (BID) | ORAL | Status: DC
  Administered 2024-09-03 – 2024-09-05 (×4): 50 mg via ORAL
  Filled 2024-09-03: qty 1
  Filled 2024-09-03: qty 2
  Filled 2024-09-03 (×2): qty 1

## 2024-09-03 MED ORDER — SODIUM CHLORIDE 0.9 % IV SOLN
2.0000 g | INTRAVENOUS | Status: DC
  Administered 2024-09-03 – 2024-09-04 (×2): 2 g via INTRAVENOUS
  Filled 2024-09-03 (×2): qty 20

## 2024-09-03 MED ORDER — GUAIFENESIN 100 MG/5ML PO LIQD: 5.0000 mL | ORAL | Status: DC | PRN

## 2024-09-03 MED ORDER — INSULIN ASPART 100 UNIT/ML IJ SOLN
0.0000 [IU] | Freq: Every day | INTRAMUSCULAR | Status: DC
  Administered 2024-09-04: 3 [IU] via SUBCUTANEOUS
  Filled 2024-09-03: qty 3

## 2024-09-03 NOTE — ED Triage Notes (Signed)
 Pt c/o increased cough and weakness starting today.  Pt reports there was a small amount of bright red blood in his mucus.  Hx of COPD.  Pt able to easily speak full sentences and no coughing noted during quick assessment.

## 2024-09-03 NOTE — ED Notes (Signed)
 Patient transported to X-ray

## 2024-09-03 NOTE — H&P (Signed)
 History and Physical    Christian Harrison FMW:[id number] DOB: 03/20/1946 DOA: 09/03/2024  PCP: Loring Tanda Mae, MD   Patient coming from: Home   Chief Complaint: Cough, SOB, fatigue   HPI: Christian Harrison is a 78 y.o. male with medical history significant for hypertension, hyperlipidemia, COPD, chronic HFmrEF, and PAF who presents with cough, shortness of breath, and fatigue.  Patient reports that he began feeling generally poor yesterday with increased fatigue, cough, and exertional dyspnea.  His symptoms have continued to worsen.  He produced some bloody sputum once, continues to have productive cough, but is no longer seeing blood.  He is now too dyspneic and generally weak to perform his ADLs.  Patient notes that he had tolerated Xarelto  well but has not been taking it recently due to cost.  ED Course: Upon arrival to the ED, patient is found to be afebrile and saturating low 90s on room air with mild tachycardia and elevated blood pressure.  Labs are most notable for glucose 205, WBC 20,400, lactic acid 2.3, and normal troponin.  Chest x-ray is concerning for pneumonia.  Blood cultures were collected in the ED and antibiotics were started.  Review of Systems:  All other systems reviewed and apart from HPI, are negative.  Past Medical History:  Diagnosis Date   Acid reflux    Asthma 09-08-11   controlled with inhalers   Colon cancer (HCC) 09-08-11   colon ca dx 02/2010. Chemo x 12 sessions   Complication of anesthesia    ? pneumonia after surgery in 2011   COPD (chronic obstructive pulmonary disease) (HCC) 09-08-11   previous smoker   Dysrhythmia 03/2010   PAF after colon resection in the setting of partial SBO -   Hernia    Hiatal hernia    Hypertension    Incisional hernia s/p open VWH repair w mesh [id number] 09/13/2011    Past Surgical History:  Procedure Laterality Date   COLON SURGERY  09-08-11   surgery in 6'11   DG FINGER*L*  09-08-11   left index finger-tendon  release   HAND SURGERY     LEFT   PORT-A-CATH REMOVAL  09/10/2011   Procedure: REMOVAL PORT-A-CATH;  Surgeon: Elon CHRISTELLA Pacini, MD;  Location: [facility];  Service: General;  Laterality: N/A;  Removal Port-a-Cath   VENTRAL HERNIA REPAIR  09/10/2011   Procedure: LAPAROSCOPIC VENTRAL HERNIA;  Surgeon: Elon CHRISTELLA Pacini, MD;  Location: [facility];  Service: General;  Laterality: N/A;  Lysis of Adhessions, Converted to open, with Physio Mesh    Social History:   reports that he quit smoking about 53 years ago. His smoking use included cigarettes. He started smoking about 54 years ago. He has never used smokeless tobacco. He reports that he does not drink alcohol and does not use drugs.  Allergies  Allergen Reactions   Penicillins Anaphylaxis and Other (See Comments)    Seizures Did it involve swelling of the face/tongue/throat, SOB, or low BP? Yes Did it involve sudden or severe rash/hives, skin peeling, or any reaction on the inside of your mouth or nose? No Did you need to seek medical attention at a hospital or doctor's office? Yes When did it last happen?    teenager  If all above answers are NO, may proceed with cephalosporin use.     Family History  Problem Relation Age of Onset   Heart disease Mother    Allergic rhinitis Neg Hx    Asthma Neg Hx  Prior to Admission medications   Medication Sig Start Date End Date Taking? Authorizing Provider  rivaroxaban  (XARELTO ) 20 MG TABS tablet Take 20 mg by mouth daily with supper.   Yes [provider]  aspirin 81 MG chewable tablet Chew 81 mg by mouth daily. Once daily    Rising, Asberry, PA-C  citalopram  (CELEXA ) 40 MG tablet Take 20 mg by mouth every evening.     [provider]  diltiazem  (CARDIZEM  CD) 120 MG 24 hr capsule Take 120 mg by mouth daily. 01/25/20   [provider]  gabapentin  (NEURONTIN ) 300 MG capsule Take 1 capsule (300 mg total) by mouth daily after supper. 07/03/22 08/02/22  Standiford, Marsa FALCON, DPM  gentamicin  ointment (GARAMYCIN ) 0.1 % Apply 1 Application topically 2 (two) times daily. 04/29/23   Janit Thresa HERO, DPM  ipratropium-albuterol  (DUONEB) 0.5-2.5 (3) MG/3ML SOLN Take 3 mLs by nebulization every 6 (six) hours as needed. 03/02/23   Rising, Asberry, PA-C  metoprolol  tartrate (LOPRESSOR ) 50 MG tablet Take 1 tablet (50 mg total) by mouth 2 (two) times daily. 01/31/19   Seawell, Jaimie A, DO  Multiple Vitamins-Minerals (MULTIVITAMINS THER. W/MINERALS) TABS Take 1 tablet by mouth every evening.     [provider]  oxyCODONE -acetaminophen  (PERCOCET) 5-325 MG tablet Take 1 tablet by mouth every 4 (four) hours as needed for severe pain. 10/09/22   Janit Thresa HERO, DPM  rosuvastatin (CRESTOR) 10 MG tablet Take 10 mg by mouth daily. 04/23/22   [provider]  sulfamethoxazole -trimethoprim  (BACTRIM  DS) 800-160 MG tablet Take 1 tablet by mouth 2 (two) times daily. 04/29/23   Janit Thresa HERO, DPM  traZODone  (DESYREL ) 150 MG tablet Take 150 mg by mouth at bedtime as needed for sleep.    [provider]  vitamin A 8000 UNIT capsule Take 8,000 Units by mouth daily.     [provider]  vitamin E 400 UNIT capsule Take 400 Units by mouth daily.     [provider]    Physical Exam: Vitals:   09/03/24 1851 09/03/24 1905 09/03/24 1945  BP: (!) 170/95  (!) 159/81  Pulse: (!) 103  95  Resp: 17  16  Temp: 99.3 F (37.4 C)    SpO2: 93%  91%  Weight:  94 kg   Height:  5' 11 (1.803 m)     Constitutional: NAD, no pallor or diaphoresis   Eyes: PERTLA, lids and conjunctivae normal ENMT: Mucous membranes are moist. Posterior pharynx clear of any exudate or lesions.   Neck: supple, no masses  Respiratory: Coarse rales. No wheezing. Dyspneic with speech.  Cardiovascular: S1 & S2 heard, regular rate and rhythm. No extremity edema.  Abdomen: No tenderness, soft. Bowel sounds active.  Musculoskeletal: no clubbing / cyanosis. No joint deformity upper and lower  extremities.   Skin: no significant rashes, lesions, ulcers. Warm, dry, well-perfused. Neurologic: CN 2-12 grossly intact. Moving all extremities. Alert and oriented. Action tremor.  Psychiatric: Calm. Cooperative.    Labs and Imaging on Admission: I have personally reviewed following labs and imaging studies  CBC: Recent Labs  Lab 09/03/24 1934  WBC 20.4*  HGB 15.1  HCT 42.7  MCV 93.0  PLT 208   Basic Metabolic Panel: Recent Labs  Lab 09/03/24 1934  NA 135  K 3.6  CL 101  CO2 21*  GLUCOSE 205*  BUN 15  CREATININE 1.01  CALCIUM 8.8*   GFR: Estimated Creatinine Clearance: 69.5 mL/min (by C-G formula based on SCr of  1.01 mg/dL). Liver Function Tests: No results for input(s): AST, ALT, ALKPHOS, BILITOT, PROT, ALBUMIN in the last 168 hours. No results for input(s): LIPASE, AMYLASE in the last 168 hours. No results for input(s): AMMONIA in the last 168 hours. Coagulation Profile: No results for input(s): INR, PROTIME in the last 168 hours. Cardiac Enzymes: No results for input(s): CKTOTAL, CKMB, CKMBINDEX, TROPONINI in the last 168 hours. BNP (last 3 results) No results for input(s): PROBNP in the last 8760 hours. HbA1C: No results for input(s): HGBA1C in the last 72 hours. CBG: No results for input(s): GLUCAP in the last 168 hours. Lipid Profile: No results for input(s): CHOL, HDL, LDLCALC, TRIG, CHOLHDL, LDLDIRECT in the last 72 hours. Thyroid Function Tests: No results for input(s): TSH, T4TOTAL, FREET4, T3FREE, THYROIDAB in the last 72 hours. Anemia Panel: No results for input(s): VITAMINB12, FOLATE, FERRITIN, TIBC, IRON, RETICCTPCT in the last 72 hours. Urine analysis:    Component Value Date/Time   COLORURINE YELLOW 01/30/2019 0845   APPEARANCEUR CLEAR 01/30/2019 0845   LABSPEC 1.014 01/30/2019 0845   PHURINE 8.0 01/30/2019 0845   GLUCOSEU NEGATIVE 01/30/2019 0845   HGBUR NEGATIVE  01/30/2019 0845   BILIRUBINUR NEGATIVE 01/30/2019 0845   KETONESUR NEGATIVE 01/30/2019 0845   PROTEINUR NEGATIVE 01/30/2019 0845   UROBILINOGEN 0.2 02/16/2014 1359   NITRITE NEGATIVE 01/30/2019 0845   LEUKOCYTESUR NEGATIVE 01/30/2019 0845   Sepsis Labs: @LABRCNTIP (procalcitonin:4,lacticidven:4) )No results found for this or any previous visit (from the past 240 hours).   Radiological Exams on Admission: DG Chest 2 View Result Date: 09/03/2024 CLINICAL DATA:  Dizziness and cough EXAM: CHEST - 2 VIEW COMPARISON:  Chest x-ray 03/02/2023 FINDINGS: There are patchy airspace opacities within the lingula and right middle lobe. There is no pleural effusion or pneumothorax. Cardiomediastinal silhouette is within normal limits. No pneumothorax or acute fracture. IMPRESSION: Patchy airspace opacities within the lingula and right middle lobe, concerning for pneumonia. Electronically Signed   By: Greig Pique M.D.   On: 09/03/2024 19:44    EKG: Independently reviewed. Atrial fibrillation, rate 105.   Assessment/Plan   1. Pneumonia  - Blood cultures collected and antibiotics started in ED  - Treat with Rocephin and azithromycin , continue supportive care, trend lactate, follow cultures and clinical course    2. PAF  - Has been off of Xarelto  due to cost  - Resume Xarelto , consult TOC for med assistance, continue diltiazem  and metoprolol     3. Chronic HFmrEF  - EF was 40-45% on echo from 2020  - Appears compensated    4. Hyperglycemia  - Serum glucose 205 in ED  - He reports hx of prediabetes, no A1c in chart  - New DM vs stress response  - Check A1c, check CBGs, use low-intensity SSI if needed    5. Hyperlipidemia  - Crestor     6. Mood disorder  - Celexa , trazodone    7. COPD  - Not in exacerbation  - Continue as-needed DuoNebs   DVT prophylaxis: Xarelto   Code Status: Full  Level of Care: Level of care: Telemetry Family Communication: Wife at bedside  Disposition Plan:   Patient is from: Home  Anticipated d/c is to: Home  Anticipated d/c date is: 09/05/24  Patient currently: Pending improved respiratory status Consults called: None  Admission status: Inpatient     Evalene GORMAN Sprinkles, MD [facility]  09/03/2024, 9:13 PM
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
## 2024-09-04 DIAGNOSIS — J189 Pneumonia, unspecified organism: Secondary | ICD-10-CM | POA: Diagnosis not present

## 2024-09-04 LAB — CBC
HCT: 39.5 % (ref 39.0–52.0)
Hemoglobin: 14 g/dL (ref 13.0–17.0)
MCH: 32.6 pg (ref 26.0–34.0)
MCHC: 35.4 g/dL (ref 30.0–36.0)
MCV: 92.1 fL (ref 80.0–100.0)
Platelets: 176 K/uL (ref 150–400)
RBC: 4.29 MIL/uL (ref 4.22–5.81)
RDW: 12.5 % (ref 11.5–15.5)
WBC: 18 K/uL — ABNORMAL HIGH (ref 4.0–10.5)
nRBC: 0 % (ref 0.0–0.2)

## 2024-09-04 LAB — BASIC METABOLIC PANEL WITH GFR
Anion gap: 6 (ref 5–15)
BUN: 17 mg/dL (ref 8–23)
CO2: 26 mmol/L (ref 22–32)
Calcium: 8.4 mg/dL — ABNORMAL LOW (ref 8.9–10.3)
Chloride: 100 mmol/L (ref 98–111)
Creatinine, Ser: 0.96 mg/dL (ref 0.61–1.24)
GFR, Estimated: >60 mL/min (ref >60)
Glucose, Bld: 120 mg/dL — ABNORMAL HIGH (ref 70–99)
Potassium: 3.5 mmol/L (ref 3.5–5.1)
Sodium: 132 mmol/L — ABNORMAL LOW (ref 135–145)

## 2024-09-04 LAB — GLUCOSE, CAPILLARY
Glucose-Capillary: 132 mg/dL — ABNORMAL HIGH (ref 70–99)
Glucose-Capillary: 147 mg/dL — ABNORMAL HIGH (ref 70–99)
Glucose-Capillary: 148 mg/dL — ABNORMAL HIGH (ref 70–99)
Glucose-Capillary: 268 mg/dL — ABNORMAL HIGH (ref 70–99)
Glucose-Capillary: 285 mg/dL — ABNORMAL HIGH (ref 70–99)

## 2024-09-04 LAB — MAGNESIUM: Magnesium: 1.5 mg/dL — ABNORMAL LOW (ref 1.7–2.4)

## 2024-09-04 MED ORDER — ASPIRIN 81 MG PO TBEC
81.0000 mg | DELAYED_RELEASE_TABLET | Freq: Every day | ORAL | Status: DC
  Administered 2024-09-04 – 2024-09-05 (×2): 81 mg via ORAL
  Filled 2024-09-04 (×2): qty 1

## 2024-09-04 MED ORDER — IPRATROPIUM-ALBUTEROL 0.5-2.5 (3) MG/3ML IN SOLN
3.0000 mL | Freq: Four times a day (QID) | RESPIRATORY_TRACT | Status: DC
  Administered 2024-09-04 – 2024-09-05 (×4): 3 mL via RESPIRATORY_TRACT
  Filled 2024-09-04 (×4): qty 3

## 2024-09-04 MED ORDER — PREDNISONE 20 MG PO TABS
40.0000 mg | ORAL_TABLET | Freq: Every day | ORAL | Status: DC
  Administered 2024-09-04 – 2024-09-05 (×2): 40 mg via ORAL
  Filled 2024-09-04 (×2): qty 2

## 2024-09-04 MED ORDER — SODIUM CHLORIDE 0.9 % IV SOLN: INTRAVENOUS | Status: AC | PRN

## 2024-09-04 MED ORDER — MAGNESIUM SULFATE 2 GM/50ML IV SOLN
2.0000 g | Freq: Once | INTRAVENOUS | Status: AC
  Administered 2024-09-04: 2 g via INTRAVENOUS
  Filled 2024-09-04: qty 50

## 2024-09-04 NOTE — Progress Notes (Signed)
 PROGRESS NOTE    Christian Harrison  FMW:[id number] DOB: November 05, 1945 DOA: 09/03/2024 PCP: Loring Tanda Mae, MD    Brief Narrative:  Christian Harrison is a 78 y.o. male with medical history significant for hypertension, hyperlipidemia, COPD, chronic HFmrEF, and PAF who presents with cough, shortness of breath, and fatigue.   Patient reports that he began feeling generally poor yesterday with increased fatigue, cough, and exertional dyspnea.  His symptoms have continued to worsen.  He produced some bloody sputum once, continues to have productive cough, but is no longer seeing blood.  He is now too dyspneic and generally weak to perform his ADLs.     Assessment and Plan: Pneumonia/COPD ex  - Blood cultures collected and antibiotics started in ED  - Treat with Rocephin and azithromycin , continue supportive care, trend lactate, follow cultures and clinical course -added steroids and scheduled nebs     PAF  - Resume Xarelto , consult TOC for med assistance, continue diltiazem  and metoprolol    -followed with Dr. Court   Chronic HFmrEF  - EF was 40-45% on echo from 2020  - Appears compensated     - He reports hx of prediabetes, no A1c in chart  - New DM vs stress response  - Check A1c, check CBGs and SSI   Hyperlipidemia  - Crestor       Mood disorder  - Celexa , trazodone     COPD  - Not in exacerbation  - Continue as-needed DuoNebs   Hypomagnesemia -replete   PT eval Home O2 eval   DVT prophylaxis:  rivaroxaban  (XARELTO ) tablet 20 mg    Code Status: Full Code Family Communication: at bedside  Disposition Plan:  Level of care: Telemetry Status is: Inpatient     Consultants:  none   Subjective: Feeling better today then yesterday  Objective: Vitals:   09/03/24 2348 09/04/24 0014 09/04/24 0341 09/04/24 0852  BP: 136/69 115/70 115/89 136/79  Pulse: 79 80  87  Resp: 15 15 15 20   Temp:  98.1 F (36.7 C) 98.5 F (36.9 C) 98.9 F (37.2 C)  TempSrc:  Oral Oral  Oral  SpO2: 99% 95% 95% 100%  Weight:  89 kg    Height:  5' 10 (1.778 m)      Intake/Output Summary (Last 24 hours) at 09/04/2024 1003 Last data filed at 09/04/2024 0700 Gross per 24 hour  Intake 220 ml  Output 600 ml  Net -380 ml   Filed Weights   09/03/24 1905 09/04/24 0014  Weight: 94 kg 89 kg    Examination:   General: Appearance:     Overweight male in no acute distress     Lungs:     respirations unlabored  Heart:    Normal heart rate.    MS:   All extremities are intact.    Neurologic:   Awake, alert       Data Reviewed: I have personally reviewed following labs and imaging studies  CBC: Recent Labs  Lab 09/03/24 1934 09/04/24 0159  WBC 20.4* 18.0*  HGB 15.1 14.0  HCT 42.7 39.5  MCV 93.0 92.1  PLT 208 176   Basic Metabolic Panel: Recent Labs  Lab 09/03/24 1934 09/04/24 0159  NA 135 132*  K 3.6 3.5  CL 101 100  CO2 21* 26  GLUCOSE 205* 120*  BUN 15 17  CREATININE 1.01 0.96  CALCIUM 8.8* 8.4*  MG  --  1.5*   GFR: Estimated Creatinine Clearance: 70.1 mL/min (by C-G formula based  on SCr of 0.96 mg/dL). Liver Function Tests: No results for input(s): AST, ALT, ALKPHOS, BILITOT, PROT, ALBUMIN in the last 168 hours. No results for input(s): LIPASE, AMYLASE in the last 168 hours. No results for input(s): AMMONIA in the last 168 hours. Coagulation Profile: No results for input(s): INR, PROTIME in the last 168 hours. Cardiac Enzymes: No results for input(s): CKTOTAL, CKMB, CKMBINDEX, TROPONINI in the last 168 hours. BNP (last 3 results) No results for input(s): PROBNP in the last 8760 hours. HbA1C: No results for input(s): HGBA1C in the last 72 hours. CBG: Recent Labs  Lab 09/04/24 0029 09/04/24 0656  GLUCAP 147* 148*   Lipid Profile: No results for input(s): CHOL, HDL, LDLCALC, TRIG, CHOLHDL, LDLDIRECT in the last 72 hours. Thyroid Function Tests: No results for input(s): TSH,  T4TOTAL, FREET4, T3FREE, THYROIDAB in the last 72 hours. Anemia Panel: No results for input(s): VITAMINB12, FOLATE, FERRITIN, TIBC, IRON, RETICCTPCT in the last 72 hours. Sepsis Labs: Recent Labs  Lab 09/03/24 2019 09/03/24 2223  LATICACIDVEN 2.3* 1.3    Recent Results (from the past 240 hours)  Culture, blood (Routine X 2) w Reflex to ID Panel     Status: None (Preliminary result)   Collection Time: 09/03/24  7:59 PM   Specimen: BLOOD LEFT HAND  Result Value Ref Range Status   Specimen Description BLOOD LEFT HAND  Final   Special Requests   Final    BOTTLES DRAWN AEROBIC AND ANAEROBIC Blood Culture adequate volume   Culture   Final    NO GROWTH < 12 HOURS Performed at [facility], [address] [place], [place] [address]    Report Status PENDING  Incomplete  Resp panel by RT-PCR (RSV, Flu A&B, Covid) Anterior Nasal Swab     Status: None   Collection Time: 09/03/24  8:24 PM   Specimen: Anterior Nasal Swab  Result Value Ref Range Status   SARS Coronavirus 2 by RT PCR NEGATIVE NEGATIVE Final   Influenza A by PCR NEGATIVE NEGATIVE Final   Influenza B by PCR NEGATIVE NEGATIVE Final    Comment: (NOTE) The Xpert Xpress SARS-CoV-2/FLU/RSV plus assay is intended as an aid in the diagnosis of influenza from Nasopharyngeal swab specimens and should not be used as a sole basis for treatment. Nasal washings and aspirates are unacceptable for Xpert Xpress SARS-CoV-2/FLU/RSV testing.  Fact Sheet for Patients: bloggercourse.com  Fact Sheet for Healthcare Providers: seriousbroker.it  This test is not yet approved or cleared by the [place]  FDA and has been authorized for detection and/or diagnosis of SARS-CoV-2 by FDA under an Emergency Use Authorization (EUA). This EUA will remain in effect (meaning this test can be used) for the duration of the COVID-19 declaration under Section 564(b)(1) of the Act,  21 U.S.C. section 360bbb-3(b)(1), unless the authorization is terminated or revoked.     Resp Syncytial Virus by PCR NEGATIVE NEGATIVE Final    Comment: (NOTE) Fact Sheet for Patients: bloggercourse.com  Fact Sheet for Healthcare Providers: seriousbroker.it  This test is not yet approved or cleared by the [place]  FDA and has been authorized for detection and/or diagnosis of SARS-CoV-2 by FDA under an Emergency Use Authorization (EUA). This EUA will remain in effect (meaning this test can be used) for the duration of the COVID-19 declaration under Section 564(b)(1) of the Act, 21 U.S.C. section 360bbb-3(b)(1), unless the authorization is terminated or revoked.  Performed at [facility], [address], [place], [place] [address]   Culture,  blood (Routine X 2) w Reflex to ID Panel     Status: None (Preliminary result)   Collection Time: 09/03/24  8:25 PM   Specimen: BLOOD RIGHT FOREARM  Result Value Ref Range Status   Specimen Description BLOOD RIGHT FOREARM  Final   Special Requests   Final    BOTTLES DRAWN AEROBIC AND ANAEROBIC Blood Culture adequate volume   Culture   Final    NO GROWTH < 12 HOURS Performed at [facility], [address] [place], [place] [address]    Report Status PENDING  Incomplete         Radiology Studies: DG Chest 2 View Result Date: 09/03/2024 CLINICAL DATA:  Dizziness and cough EXAM: CHEST - 2 VIEW COMPARISON:  Chest x-ray 03/02/2023 FINDINGS: There are patchy airspace opacities within the lingula and right middle lobe. There is no pleural effusion or pneumothorax. Cardiomediastinal silhouette is within normal limits. No pneumothorax or acute fracture. IMPRESSION: Patchy airspace opacities within the lingula and right middle lobe, concerning for pneumonia. Electronically Signed   By: Greig Pique M.D.   On: 09/03/2024 19:44        Scheduled Meds:  aspirin  EC  81 mg Oral  Daily   azithromycin   500 mg Oral Daily   citalopram   20 mg Oral QPM   diltiazem   120 mg Oral Daily   insulin  aspart  0-5 Units Subcutaneous QHS   insulin  aspart  0-6 Units Subcutaneous TID WC   ipratropium-albuterol   3 mL Nebulization Q6H   metoprolol  tartrate  50 mg Oral BID   predniSONE   40 mg Oral Q breakfast   rivaroxaban   20 mg Oral Q supper   rosuvastatin   10 mg Oral Daily   sodium chloride  flush  3 mL Intravenous Q12H   Continuous Infusions:  cefTRIAXone  (ROCEPHIN )  IV Stopped (09/03/24 2247)   magnesium  sulfate bolus IVPB       LOS: 1 day    Time spent: 45 minutes spent on chart review, discussion with nursing staff, consultants, updating family and interview/physical exam; more than 50% of that time was spent in counseling and/or coordination of care.    Harlene RAYMOND Bowl, DO [facility] Available via Epic secure chat 7am-7pm After these hours, please refer to coverage provider listed on [email or web address] 09/04/2024, 10:03 AM

## 2024-09-04 NOTE — Progress Notes (Signed)
 SATURATION QUALIFICATIONS: (This note is used to comply with regulatory documentation for home oxygen)  Patient Saturations on Room Air at Rest = 95%  Patient Saturations on Room Air while Ambulating = 94%  Patient Saturations on N/A Liters of oxygen while Ambulating = N/A%  Please briefly explain why patient needs home oxygen: Pt does not require supplemental O2.  [facility] PT Acute Rehabilitation Services Office [phone]

## 2024-09-04 NOTE — Plan of Care (Signed)

## 2024-09-04 NOTE — Evaluation (Signed)
 Physical Therapy Evaluation Patient Details Name: Christian Harrison MRN: [id number] DOB: 1945-10-18 Today's Date: 09/04/2024  History of Present Illness  Pt is 78 year old presented to [facility] on  09/03/24 for PNA/copd exacerbation. PMH - HTN, COPD, chf, afib  Clinical Impression  Pt presents to PT with slightly unsteady gait due to illness and inactivity. Expect pt will make good progress back to baseline with mobility. Will follow acutely but doubt pt will need PT after DC.          If plan is discharge home, recommend the following: Help with stairs or ramp for entrance   Can travel by private vehicle        Equipment Recommendations None recommended by PT  Recommendations for Other Services       Functional Status Assessment Patient has had a recent decline in their functional status and demonstrates the ability to make significant improvements in function in a reasonable and predictable amount of time.     Precautions / Restrictions Precautions Precautions: Fall Recall of Precautions/Restrictions: Intact Restrictions Weight Bearing Restrictions Per Provider Order: No      Mobility  Bed Mobility Overal bed mobility: Modified Independent                  Transfers Overall transfer level: Needs assistance Equipment used: None Transfers: Sit to/from Stand Sit to Stand: Supervision                Ambulation/Gait Ambulation/Gait assistance: Contact guard assist Gait Distance (Feet): 200 Feet Assistive device: None Gait Pattern/deviations: Step-through pattern, Decreased stride length, Drifts right/left Gait velocity: decr Gait velocity interpretation: 1.31 - 2.62 ft/sec, indicative of limited community ambulator   General Gait Details: Slightly unsteady with tendency to drift left  Stairs            Wheelchair Mobility     Tilt Bed    Modified Rankin (Stroke Patients Only)       Balance Overall balance assessment: Needs  assistance Sitting-balance support: No upper extremity supported, Feet supported Sitting balance-Leahy Scale: Normal     Standing balance support: No upper extremity supported, During functional activity Standing balance-Leahy Scale: Good                               Pertinent Vitals/Pain Pain Assessment Pain Assessment: No/denies pain    Home Living Family/patient expects to be discharged to:: Private residence Living Arrangements: Spouse/significant other Available Help at Discharge: Family;Available 24 hours/day Type of Home: House Home Access: Stairs to enter Entrance Stairs-Rails: None Entrance Stairs-Number of Steps: 3   Home Layout: One level Home Equipment: Agricultural Consultant (2 wheels);Cane - single point      Prior Function Prior Level of Function : Independent/Modified Independent;Driving             Mobility Comments: No assistive device       Extremity/Trunk Assessment   Upper Extremity Assessment Upper Extremity Assessment: Overall WFL for tasks assessed    Lower Extremity Assessment Lower Extremity Assessment: Generalized weakness       Communication   Communication Communication: No apparent difficulties    Cognition Arousal: Alert Behavior During Therapy: WFL for tasks assessed/performed   PT - Cognitive impairments: No apparent impairments                         Following commands: Intact  Cueing Cueing Techniques: Verbal cues     General Comments General comments (skin integrity, edema, etc.): SpO2 >93% on RA throughout    Exercises     Assessment/Plan    PT Assessment Patient needs continued PT services  PT Problem List Decreased strength;Decreased balance;Decreased mobility       PT Treatment Interventions Gait training;Functional mobility training;DME instruction;Therapeutic activities;Therapeutic exercise;Balance training;Patient/family education    PT Goals (Current goals can be found in  the Care Plan section)  Acute Rehab PT Goals Patient Stated Goal: return home PT Goal Formulation: With patient Time For Goal Achievement: 09/11/24 Potential to Achieve Goals: Good    Frequency Min 2X/week     Co-evaluation               AM-PAC PT 6 Clicks Mobility  Outcome Measure Help needed turning from your back to your side while in a flat bed without using bedrails?: None Help needed moving from lying on your back to sitting on the side of a flat bed without using bedrails?: None Help needed moving to and from a bed to a chair (including a wheelchair)?: A Little Help needed standing up from a chair using your arms (e.g., wheelchair or bedside chair)?: A Little Help needed to walk in hospital room?: A Little Help needed climbing 3-5 steps with a railing? : A Little 6 Click Score: 20    End of Session   Activity Tolerance: Patient tolerated treatment well Patient left: in chair;with call bell/phone within reach;with family/visitor present Nurse Communication: Mobility status PT Visit Diagnosis: Unsteadiness on feet (R26.81);Muscle weakness (generalized) (M62.81)    Time: 8572-8557 PT Time Calculation (min) (ACUTE ONLY): 15 min   Charges:   PT Evaluation $PT Eval Low Complexity: 1 Low   PT General Charges $$ ACUTE PT VISIT: 1 Visit         [facility] PT Acute Rehabilitation Services Office [phone]   [facility] 09/04/2024, 2:55 PM

## 2024-09-04 NOTE — Plan of Care (Signed)

## 2024-09-05 ENCOUNTER — Other Ambulatory Visit (HOSPITAL_COMMUNITY): Payer: Self-pay

## 2024-09-05 ENCOUNTER — Telehealth (HOSPITAL_COMMUNITY): Payer: Self-pay

## 2024-09-05 DIAGNOSIS — R739 Hyperglycemia, unspecified: Secondary | ICD-10-CM | POA: Diagnosis not present

## 2024-09-05 DIAGNOSIS — J189 Pneumonia, unspecified organism: Secondary | ICD-10-CM | POA: Diagnosis not present

## 2024-09-05 DIAGNOSIS — I480 Paroxysmal atrial fibrillation: Secondary | ICD-10-CM | POA: Diagnosis not present

## 2024-09-05 LAB — BASIC METABOLIC PANEL WITH GFR
Anion gap: 6 (ref 5–15)
Anion gap: 8 (ref 5–15)
BUN: 22 mg/dL (ref 8–23)
BUN: 23 mg/dL (ref 8–23)
CO2: 23 mmol/L (ref 22–32)
CO2: 26 mmol/L (ref 22–32)
Calcium: 8.5 mg/dL — ABNORMAL LOW (ref 8.9–10.3)
Calcium: 8.8 mg/dL — ABNORMAL LOW (ref 8.9–10.3)
Chloride: 103 mmol/L (ref 98–111)
Chloride: 104 mmol/L (ref 98–111)
Creatinine, Ser: 1.04 mg/dL (ref 0.61–1.24)
Creatinine, Ser: 1.13 mg/dL (ref 0.61–1.24)
GFR, Estimated: >60 mL/min (ref >60)
GFR, Estimated: >60 mL/min (ref >60)
Glucose, Bld: 146 mg/dL — ABNORMAL HIGH (ref 70–99)
Glucose, Bld: 203 mg/dL — ABNORMAL HIGH (ref 70–99)
Potassium: 3.8 mmol/L (ref 3.5–5.1)
Potassium: 3.9 mmol/L (ref 3.5–5.1)
Sodium: 134 mmol/L — ABNORMAL LOW (ref 135–145)
Sodium: 136 mmol/L (ref 135–145)

## 2024-09-05 LAB — HEMOGLOBIN A1C
Hgb A1c MFr Bld: 6.3 % — ABNORMAL HIGH (ref 4.8–5.6)
Mean Plasma Glucose: 134 mg/dL

## 2024-09-05 LAB — CBC
HCT: 40.8 % (ref 39.0–52.0)
Hemoglobin: 14.4 g/dL (ref 13.0–17.0)
MCH: 32.9 pg (ref 26.0–34.0)
MCHC: 35.3 g/dL (ref 30.0–36.0)
MCV: 93.2 fL (ref 80.0–100.0)
Platelets: 178 K/uL (ref 150–400)
RBC: 4.38 MIL/uL (ref 4.22–5.81)
RDW: 12.2 % (ref 11.5–15.5)
WBC: 16.6 K/uL — ABNORMAL HIGH (ref 4.0–10.5)
nRBC: 0 % (ref 0.0–0.2)

## 2024-09-05 LAB — GLUCOSE, CAPILLARY: Glucose-Capillary: 130 mg/dL — ABNORMAL HIGH (ref 70–99)

## 2024-09-05 LAB — MAGNESIUM
Magnesium: 2.1 mg/dL (ref 1.7–2.4)
Magnesium: 2.2 mg/dL (ref 1.7–2.4)

## 2024-09-05 MED ORDER — DOXYCYCLINE HYCLATE 100 MG PO TABS: 100.0000 mg | ORAL_TABLET | Freq: Two times a day (BID) | ORAL | 0 refills | Status: DC

## 2024-09-05 MED ORDER — PREDNISONE 20 MG PO TABS: 40.0000 mg | ORAL_TABLET | Freq: Every day | ORAL | 0 refills | Status: DC

## 2024-09-05 MED ORDER — IPRATROPIUM-ALBUTEROL 0.5-2.5 (3) MG/3ML IN SOLN: 3.0000 mL | Freq: Four times a day (QID) | RESPIRATORY_TRACT | Status: DC | PRN

## 2024-09-05 MED ORDER — ALBUTEROL SULFATE HFA 108 (90 BASE) MCG/ACT IN AERS: 2.0000 | INHALATION_SPRAY | RESPIRATORY_TRACT | 1 refills | Status: AC | PRN

## 2024-09-05 MED ORDER — RIVAROXABAN 20 MG PO TABS
20.0000 mg | ORAL_TABLET | Freq: Every day | ORAL | 0 refills | Status: DC
  Filled 2024-09-05: qty 30, 30d supply, fill #0

## 2024-09-05 NOTE — Discharge Instructions (Signed)
 You may be able to apply for patient assistance or Medicare RX Payment Plan (you Must reach out to your plan, if eligible for payment plan), if available.

## 2024-09-05 NOTE — Discharge Summary (Addendum)
 Physician Discharge Summary  IZZAK FRIES FMW:[id number] DOB: [birth year] DOA: 09/03/2024  PCP: Loring Tanda Mae, MD  Admit date: 09/03/2024 Discharge date: 09/05/2024  Admitted From:  Discharge disposition: home   Recommendations for Outpatient Follow-Up:   Close cardiology follow up-- on xarelto -- has not been taking due to costs   Discharge Diagnosis:   Principal Problem:   Pneumonia Active Problems:   Hypertension   Hyperlipidemia   COPD (chronic obstructive pulmonary disease) (HCC)   Cardiomyopathy (HCC)   PAF (paroxysmal atrial fibrillation) (HCC)   Hyperglycemia    Discharge Condition: Improved.  Diet recommendation:  Carbohydrate-modified  Wound care: None.  Code status: Full.   History of Present Illness:   Christian Harrison is a 78 y.o. male with medical history significant for hypertension, hyperlipidemia, COPD, chronic HFmrEF, and PAF who presents with cough, shortness of breath, and fatigue.   Patient reports that he began feeling generally poor yesterday with increased fatigue, cough, and exertional dyspnea.  His symptoms have continued to worsen.  He produced some bloody sputum once, continues to have productive cough, but is no longer seeing blood.  He is now too dyspneic and generally weak to perform his ADLs.   Patient notes that he had tolerated Xarelto  well but has not been taking it recently due to cost.   ED Course: Upon arrival to the ED, patient is found to be afebrile and saturating low 90s on room air with mild tachycardia and elevated blood pressure.  Labs are most notable for glucose 205, WBC 20,400, lactic acid 2.3, and normal troponin.  Chest x-ray is concerning for pneumonia.   Blood cultures were collected in the ED and antibiotics were started.   Hospital Course by Problem:   Pneumonia/COPD ex  - Blood cultures collected and antibiotics started in ED  - Treat with Rocephin and azithromycin - change to doxy to finish  couse -added steroids and scheduled nebs     PAF  - Resume Xarelto , consult TOC for med assistance, continue diltiazem  and metoprolol    -followed with Dr. Court   Chronic HFmrEF  - EF was 40-45% on echo from 2020  - Appears compensated     - He reports hx of prediabetes, no A1c in chart  - continue diet and metformin- A1C: 6.3-- outpatient follow up   Hyperlipidemia  - Crestor       Mood disorder  - Celexa , trazodone     COPD  - steroid burst - Continue as-needed DuoNebs   Hypomagnesemia -replete    Medical Consultants:      Discharge Exam:   Vitals:   09/05/24 0747 09/05/24 0846  BP: (!) 159/75   Pulse: 77   Resp: (!) 21   Temp: 97.7 F (36.5 C)   SpO2: 94% 97%   Vitals:   09/04/24 2340 09/05/24 0408 09/05/24 0747 09/05/24 0846  BP: (!) 149/74 132/83 (!) 159/75   Pulse: 73 78 77   Resp: 15 18 (!) 21   Temp: 98.5 F (36.9 C) 98.3 F (36.8 C) 97.7 F (36.5 C)   TempSrc: Oral Oral Oral   SpO2: 97% 98% 94% 97%  Weight:      Height:        General exam: Appears calm and comfortable.    The results of significant diagnostics from this hospitalization (including imaging, microbiology, ancillary and laboratory) are listed below for reference.     Procedures and Diagnostic Studies:   DG Chest 2 View Result Date:  09/03/2024 CLINICAL DATA:  Dizziness and cough EXAM: CHEST - 2 VIEW COMPARISON:  Chest x-ray 03/02/2023 FINDINGS: There are patchy airspace opacities within the lingula and right middle lobe. There is no pleural effusion or pneumothorax. Cardiomediastinal silhouette is within normal limits. No pneumothorax or acute fracture. IMPRESSION: Patchy airspace opacities within the lingula and right middle lobe, concerning for pneumonia. Electronically Signed   By: Greig Pique M.D.   On: 09/03/2024 19:44     Labs:   Basic Metabolic Panel: Recent Labs  Lab 09/03/24 1934 09/04/24 0159 09/04/24 2353 09/05/24 0225  NA 135 132* 134* 136  K 3.6  3.5 3.8 3.9  CL 101 100 103 104  CO2 21* 26 23 26   GLUCOSE 205* 120* 203* 146*  BUN 15 17 23 22   CREATININE 1.01 0.96 1.13 1.04  CALCIUM 8.8* 8.4* 8.5* 8.8*  MG  --  1.5* 2.2 2.1   GFR Estimated Creatinine Clearance: 64.7 mL/min (by C-G formula based on SCr of 1.04 mg/dL). Liver Function Tests: No results for input(s): AST, ALT, ALKPHOS, BILITOT, PROT, ALBUMIN in the last 168 hours. No results for input(s): LIPASE, AMYLASE in the last 168 hours. No results for input(s): AMMONIA in the last 168 hours. Coagulation profile No results for input(s): INR, PROTIME in the last 168 hours.  CBC: Recent Labs  Lab 09/03/24 1934 09/04/24 0159 09/05/24 0225  WBC 20.4* 18.0* 16.6*  HGB 15.1 14.0 14.4  HCT 42.7 39.5 40.8  MCV 93.0 92.1 93.2  PLT 208 176 178   Cardiac Enzymes: No results for input(s): CKTOTAL, CKMB, CKMBINDEX, TROPONINI in the last 168 hours. BNP: Invalid input(s): POCBNP CBG: Recent Labs  Lab 09/04/24 0656 09/04/24 1150 09/04/24 1646 09/04/24 2123 09/05/24 0612  GLUCAP 148* 132* 268* 285* 130*   D-Dimer No results for input(s): DDIMER in the last 72 hours. Hgb A1c Recent Labs    09/03/24 2216  HGBA1C 6.3*   Lipid Profile No results for input(s): CHOL, HDL, LDLCALC, TRIG, CHOLHDL, LDLDIRECT in the last 72 hours. Thyroid function studies No results for input(s): TSH, T4TOTAL, T3FREE, THYROIDAB in the last 72 hours.  Invalid input(s): FREET3 Anemia work up No results for input(s): VITAMINB12, FOLATE, FERRITIN, TIBC, IRON, RETICCTPCT in the last 72 hours. Microbiology Recent Results (from the past 240 hours)  Culture, blood (Routine X 2) w Reflex to ID Panel     Status: None (Preliminary result)   Collection Time: 09/03/24  7:59 PM   Specimen: BLOOD LEFT HAND  Result Value Ref Range Status   Specimen Description BLOOD LEFT HAND  Final   Special Requests   Final    BOTTLES DRAWN AEROBIC  AND ANAEROBIC Blood Culture adequate volume   Culture   Final    NO GROWTH 2 DAYS Performed at [facility], [address] [place], [place] [address]    Report Status PENDING  Incomplete  Resp panel by RT-PCR (RSV, Flu A&B, Covid) Anterior Nasal Swab     Status: None   Collection Time: 09/03/24  8:24 PM   Specimen: Anterior Nasal Swab  Result Value Ref Range Status   SARS Coronavirus 2 by RT PCR NEGATIVE NEGATIVE Final   Influenza A by PCR NEGATIVE NEGATIVE Final   Influenza B by PCR NEGATIVE NEGATIVE Final    Comment: (NOTE) The Xpert Xpress SARS-CoV-2/FLU/RSV plus assay is intended as an aid in the diagnosis of influenza from Nasopharyngeal swab specimens and should not be used as a sole basis for treatment. Nasal  washings and aspirates are unacceptable for Xpert Xpress SARS-CoV-2/FLU/RSV testing.  Fact Sheet for Patients: bloggercourse.com  Fact Sheet for Healthcare Providers: seriousbroker.it  This test is not yet approved or cleared by the [place]  FDA and has been authorized for detection and/or diagnosis of SARS-CoV-2 by FDA under an Emergency Use Authorization (EUA). This EUA will remain in effect (meaning this test can be used) for the duration of the COVID-19 declaration under Section 564(b)(1) of the Act, 21 U.S.C. section 360bbb-3(b)(1), unless the authorization is terminated or revoked.     Resp Syncytial Virus by PCR NEGATIVE NEGATIVE Final    Comment: (NOTE) Fact Sheet for Patients: bloggercourse.com  Fact Sheet for Healthcare Providers: seriousbroker.it  This test is not yet approved or cleared by the [place]  FDA and has been authorized for detection and/or diagnosis of SARS-CoV-2 by FDA under an Emergency Use Authorization (EUA). This EUA will remain in effect (meaning this test can be used) for the duration of the COVID-19 declaration  under Section 564(b)(1) of the Act, 21 U.S.C. section 360bbb-3(b)(1), unless the authorization is terminated or revoked.  Performed at [facility], [address], [place], [place] [address]   Culture, blood (Routine X 2) w Reflex to ID Panel     Status: None (Preliminary result)   Collection Time: 09/03/24  8:25 PM   Specimen: BLOOD RIGHT FOREARM  Result Value Ref Range Status   Specimen Description BLOOD RIGHT FOREARM  Final   Special Requests   Final    BOTTLES DRAWN AEROBIC AND ANAEROBIC Blood Culture adequate volume   Culture   Final    NO GROWTH 2 DAYS Performed at [facility], [address] [place], [place] [address]    Report Status PENDING  Incomplete     Discharge Instructions:   Discharge Instructions     Ambulatory referral to Cardiology   Complete by: As directed    Used to see Dr. Court-- needs new doctor for a fib and help with NOAC   Diet Carb Modified   Complete by: As directed    Increase activity slowly   Complete by: As directed       Allergies as of 09/05/2024       Reactions   Penicillins Anaphylaxis, Other (See Comments)   Seizures Did it involve swelling of the face/tongue/throat, SOB, or low BP? Yes Did it involve sudden or severe rash/hives, skin peeling, or any reaction on the inside of your mouth or nose? No Did you need to seek medical attention at a hospital or doctor's office? Yes When did it last happen?    teenager  If all above answers are NO, may proceed with cephalosporin use.        Medication List     TAKE these medications    albuterol  108 (90 Base) MCG/ACT inhaler Commonly known as: VENTOLIN  HFA Inhale 2 puffs into the lungs every 4 (four) hours as needed for wheezing or shortness of breath.   aspirin EC 81 MG tablet Take 81 mg by mouth daily. Once daily   Aspirin-Caffeine 500-32.5 MG Tabs Take 2 tablets by mouth every 6 (six) hours as needed (muscle and joint pain).   citalopram  40 MG  tablet Commonly known as: CELEXA  Take 20-40 mg by mouth daily. Take one-half tablet (20mg )- one whole tablet (40mg ) by mouth every morning.   cyanocobalamin 1000 MCG tablet Take 1,000 mcg by mouth in the morning.   diltiazem  120 MG 24 hr capsule  Commonly known as: CARDIZEM  CD Take 120 mg by mouth daily.   doxycycline  100 MG tablet Commonly known as: VIBRA -TABS Take 1 tablet (100 mg total) by mouth 2 (two) times daily.   ipratropium-albuterol  0.5-2.5 (3) MG/3ML Soln Commonly known as: DUONEB Take 3 mLs by nebulization every 6 (six) hours as needed.   metFORMIN 500 MG tablet Commonly known as: GLUCOPHAGE Take 500 mg by mouth 2 (two) times daily.   metoprolol  tartrate 50 MG tablet Commonly known as: LOPRESSOR  Take 1 tablet (50 mg total) by mouth 2 (two) times daily. What changed: when to take this   multivitamins ther. w/minerals Tabs tablet Take 1 tablet by mouth every evening.   predniSONE  20 MG tablet Commonly known as: DELTASONE  Take 2 tablets (40 mg total) by mouth daily with breakfast. Start taking on: September 06, 2024   rosuvastatin 10 MG tablet Commonly known as: CRESTOR Take 10 mg by mouth daily.   traZODone  150 MG tablet Commonly known as: DESYREL  Take 150 mg by mouth at bedtime as needed for sleep.   vitamin A 8000 UNIT capsule Take 8,000 Units by mouth daily.   vitamin C 1000 MG tablet Take 1,000 mg by mouth in the morning.   vitamin E 180 MG (400 UNITS) capsule Take 400 Units by mouth daily.        Follow-up Information     Loring Tanda Mae, MD Follow up.   Specialty: Family Medicine Why: CBC-- ensure resolution of PNA Contact information: [address] [place] [address] [phone]                  Time coordinating discharge: 45 min  Signed:  Harlene RAYMOND Bowl DO  [facility] 09/05/2024, 9:40 AM

## 2024-09-05 NOTE — TOC Transition Note (Signed)
 Transition of Care [facility]) - Discharge Note   Patient Details  Name: Christian Harrison MRN: [id number] Date of Birth: October 04, 1946  [facility] CM/SW Contact:  Waddell Barnie Rama, RN Phone Number: 09/05/2024, 9:48 AM   Clinical Narrative:    For dc today, wife will transport home.  Patient states he is ok with the price for right now, wife will check with the insurance company  about a better medication plan before enorllment period ends.         Patient Goals and CMS Choice            Discharge Placement                       Discharge Plan and Services Additional resources added to the After Visit Summary for                                       Social Drivers of Health (SDOH) Interventions SDOH Screenings   Food Insecurity: No Food Insecurity (09/04/2024)  Housing: Low Risk  (09/04/2024)  Transportation Needs: No Transportation Needs (09/04/2024)  Utilities: Not At Risk (09/04/2024)  Social Connections: Unknown (09/04/2024)  Tobacco Use: Medium Risk (09/03/2024)     Readmission Risk Interventions    09/05/2024    9:46 AM  Readmission Risk Prevention Plan  Transportation Screening Complete  PCP or Specialist Appt within 5-7 Days Complete  Home Care Screening Complete  Medication Review (RN CM) Complete

## 2024-09-05 NOTE — Progress Notes (Signed)
 Physical Therapy Treatment Patient Details Name: Christian Harrison MRN: [id number] DOB: [birth year] Today's Date: 09/05/2024   History of Present Illness Pt is 78 year old presented [facility]  09/03/24 for PNA/copd exacerbation. PMH - HTN, COPD, chf, afib    PT Comments  Pt demonstrates good progress throughout session, negotiating steps without physical assistance and no losses of balance or signs of instability noted; pt given stair negotiation handout. Pt would benefit from further gait, and higher level balance training. PT will continue to treat pt while he is admitted. No follow-up PT recommended at discharge.     If plan is discharge home, recommend the following: Help with stairs or ramp for entrance   Can travel by private vehicle        Equipment Recommendations  None recommended by PT    Recommendations for Other Services       Precautions / Restrictions Precautions Precautions: Fall Recall of Precautions/Restrictions: Intact Restrictions Weight Bearing Restrictions Per Provider Order: No     Mobility  Bed Mobility Overal bed mobility:  (pt received sitting in recliner and returned to recliner at end of session)                  Transfers Overall transfer level: Needs assistance Equipment used: None Transfers: Sit to/from Stand Sit to Stand: Supervision           General transfer comment: Pt completed multiple sit to stands throughout session w/ no AD and supervision. Pt uses RUE to push up from chair. Increased time to complete.    Ambulation/Gait Ambulation/Gait assistance: Supervision, Min assist Gait Distance (Feet): 200 Feet Assistive device: None Gait Pattern/deviations: Step-through pattern, Decreased stride length, Drifts right/left, Trunk flexed, Wide base of support (RLE is significantly ER throughout gait) Gait velocity: reduced Gait velocity interpretation: 1.31 - 2.62 ft/sec, indicative of limited community ambulator   General Gait  Details: Pt demonstrates reciprocal gait pattern w/ RLE displaying significant ER throughout gait. Pt occasionally drifts L and had one instance of L lateral loss of balance requiring min A for maintaining upright.   Stairs Stairs: Yes Stairs assistance: Contact guard assist Stair Management: No rails Number of Stairs: 4 General stair comments: Pt negotiated steps forward, ascending with LLE and descending with RLE. Pt demonstrates good control w/out, with no signs of instability noted.   Wheelchair Mobility     Tilt Bed    Modified Rankin (Stroke Patients Only)       Balance Overall balance assessment: Needs assistance Sitting-balance support: No upper extremity supported, Feet supported Sitting balance-Leahy Scale: Normal Sitting balance - Comments: seated EOB w/out loss of balance   Standing balance support: No upper extremity supported, During functional activity Standing balance-Leahy Scale: Good Standing balance comment: Able to shift weight in standing                            Communication Communication Communication: No apparent difficulties  Cognition Arousal: Alert Behavior During Therapy: WFL for tasks assessed/performed   PT - Cognitive impairments: No apparent impairments                         Following commands: Intact      Cueing Cueing Techniques: Verbal cues, Visual cues  Exercises      General Comments General comments (skin integrity, edema, etc.): Sp02 93% throughout mobility      Pertinent Vitals/Pain Pain Assessment  Pain Assessment: No/denies pain    Home Living                          Prior Function            PT Goals (current goals can now be found in the care plan section) Acute Rehab PT Goals Patient Stated Goal: to go home PT Goal Formulation: With patient Time For Goal Achievement: 09/11/24 Potential to Achieve Goals: Good Progress towards PT goals: Progressing toward goals     Frequency    Min 2X/week      PT Plan      Co-evaluation              AM-PAC PT 6 Clicks Mobility   Outcome Measure  Help needed turning from your back to your side while in a flat bed without using bedrails?: None Help needed moving from lying on your back to sitting on the side of a flat bed without using bedrails?: None Help needed moving to and from a bed to a chair (including a wheelchair)?: A Little Help needed standing up from a chair using your arms (e.g., wheelchair or bedside chair)?: A Little Help needed to walk in hospital room?: A Little Help needed climbing 3-5 steps with a railing? : A Little 6 Click Score: 20    End of Session Equipment Utilized During Treatment: Gait belt Activity Tolerance: Patient tolerated treatment well Patient left: in chair;with call bell/phone within reach;with family/visitor present Nurse Communication: Mobility status PT Visit Diagnosis: Unsteadiness on feet (R26.81);Muscle weakness (generalized) (M62.81)     Time: 9081-9071 PT Time Calculation (min) (ACUTE ONLY): 10 min  Charges:    $Gait Training: 8-22 mins PT General Charges $$ ACUTE PT VISIT: 1 Visit                     Leontine Hilt DPT Acute Rehab Services [phone] Prefer contact via chat    Leontine NOVAK Timaya Bojarski 09/05/2024, 9:43 AM

## 2024-09-05 NOTE — Telephone Encounter (Signed)
 Pharmacy Patient Advocate Encounter  Insurance verification completed.    The patient is insured through [facility]. Patient has Medicare and is not eligible for a copay card, but may be able to apply for patient assistance or Medicare RX Payment Plan (Patient Must reach out to their plan, if eligible for payment plan), if available.    Ran test claim for Xarelto  20mg  and the current 30 day co-pay is $249.52.  Ran test claim for Eliquis 5mg  and the current 30 day co-pay is $249.52.  This test claim was processed through [facility]- copay amounts may vary at other pharmacies due to pharmacy/plan contracts, or as the patient moves through the different stages of their insurance plan.

## 2024-09-05 NOTE — Progress Notes (Signed)
 Discharge Nurse Summary: DC order noted per MD. DC RN at bedside with patient. Patient agreeable with discharge plan, states wife is going to grab the car. AVS printed/reviewed. PIV removed, skin intact. No DME needs. No home meds. TOC meds pending pickup. CP/Edu resolved. Telemonitor returned to charging station. All belongings accounted for. Patient wheeled downstairs for discharge by private auto.   Rosario EMERSON Lund, RN

## 2024-09-05 NOTE — TOC CM/SW Note (Signed)
 Transition of Care [facility]) - Inpatient Brief Assessment   Patient Details  Name: Christian Harrison MRN: [id number] Date of Birth: [birth year]  [facility]) CM/SW Contact:    Waddell Barnie Rama, RN Phone Number: 09/05/2024, 9:47 AM   Clinical Narrative: From home with spouse, has PCP and insurance on file, states has no HH services in place at this time or DME at home.  States family member  (wife) will transport them home at [employer] and family is support system, states gets medications from [employer] or [facility].  Pta self ambulatory.   There are no ICM needs identified  at this time.  Please place consult for ICM needs.     Transition of Care Asessment: Insurance and Status: Insurance coverage has been reviewed Patient has primary care physician: Yes Home environment has been reviewed: home with wife Prior level of function:: indep Prior/Current Home Services: No current home services Social Drivers of Health Review: SDOH reviewed no interventions necessary Readmission risk has been reviewed: Yes Transition of care needs: no transition of care needs at this time

## 2024-09-08 LAB — CULTURE, BLOOD (ROUTINE X 2)
Culture: NO GROWTH
Culture: NO GROWTH
Special Requests: ADEQUATE
Special Requests: ADEQUATE

## 2024-10-26 ENCOUNTER — Telehealth: Payer: Self-pay | Admitting: Pharmacist

## 2024-10-26 ENCOUNTER — Encounter: Payer: Self-pay | Admitting: Cardiovascular Disease

## 2024-10-26 ENCOUNTER — Telehealth: Payer: Self-pay | Admitting: Pharmacy Technician

## 2024-10-26 ENCOUNTER — Other Ambulatory Visit (HOSPITAL_COMMUNITY): Payer: Self-pay

## 2024-10-26 ENCOUNTER — Ambulatory Visit: Attending: Cardiology | Admitting: Cardiovascular Disease

## 2024-10-26 VITALS — BP 156/78 | HR 54 | Ht 70.0 in | Wt 206.4 lb

## 2024-10-26 DIAGNOSIS — I10 Essential (primary) hypertension: Secondary | ICD-10-CM

## 2024-10-26 DIAGNOSIS — I480 Paroxysmal atrial fibrillation: Secondary | ICD-10-CM

## 2024-10-26 DIAGNOSIS — I429 Cardiomyopathy, unspecified: Secondary | ICD-10-CM | POA: Diagnosis not present

## 2024-10-26 NOTE — Patient Instructions (Signed)
 Medication Instructions:  Your physician recommends that you continue on your current medications as directed. Please refer to the Current Medication list given to you today.  *If you need a refill on your cardiac medications before your next appointment, please call your pharmacy*  Testing/Procedures: Your physician has requested that you have an echocardiogram. Echocardiography is a painless test that uses sound waves to create images of your heart. It provides your doctor with information about the size and shape of your heart and how well your hearts chambers and valves are working. This procedure takes approximately one hour. There are no restrictions for this procedure. Please do NOT wear cologne, perfume, aftershave, or lotions (deodorant is allowed). Please arrive 15 minutes prior to your appointment time.  Please note: We ask at that you not bring children with you during ultrasound (echo/ vascular) testing. Due to room size and safety concerns, children are not allowed in the ultrasound rooms during exams. Our front office staff cannot provide observation of children in our lobby area while testing is being conducted. An adult accompanying a patient to their appointment will only be allowed in the ultrasound room at the discretion of the ultrasound technician under special circumstances. We apologize for any inconvenience.   Follow-Up: At [facility], you and your health needs are our priority.  As part of our continuing mission to provide you with exceptional heart care, our providers are all part of one team.  This team includes your primary Cardiologist (physician) and Advanced Practice Providers or APPs (Physician Assistants and Nurse Practitioners) who all work together to provide you with the care you need, when you need it.  Your next appointment:   3 month(s)  Provider:   Lum Louis, NP, Aline Door, PA-C, Kathleen Johnson, PA-C, Hao Meng, PA-C, Damien Braver,  NP, or Katlyn West, NP         Then, Dorn Lesches, MD will plan to see you again in 12 month(s).    We recommend signing up for the patient portal called MyChart.  Sign up information is provided on this After Visit Summary.  MyChart is used to connect with patients for Virtual Visits (Telemedicine).  Patients are able to view lab/test results, encounter notes, upcoming appointments, etc.  Non-urgent messages can be sent to your provider as well.   To learn more about what you can do with MyChart, go to [email or web address].   Other Instructions

## 2024-10-26 NOTE — Telephone Encounter (Signed)
 Christian Harrison, Christian Harrison, [facility]    10/26/24  3:14 PM Note Please enroll patient in grant, cardiomyopathy diagnosis. Will pick up downstairs when approved      Patient Advocate Encounter   The patient was approved for a [employer] grant that will help cover the cost of XARELTO  Total amount awarded, 2500.  Effective: 09/26/24 - 09/25/25   APW:[id number] ERW:[id number] Group:[id number] PI:[id number] [employer] ID: [id number]   Pharmacy provided with approval and processing information. Patient informed via mychart

## 2024-10-26 NOTE — Assessment & Plan Note (Signed)
 History of essential hypertension blood pressure measured today at 156/78.  He is on diltiazem , and metoprolol .

## 2024-10-26 NOTE — Progress Notes (Signed)
 "     10/26/2024 Christian Harrison   09-11-1946  [id number]  Primary Physician Loring Tanda Mae, MD Primary Cardiologist: Dorn JINNY Lesches MD [facility] [place], [place]  HPI:  Christian Harrison is a 79 y.o.   thin appearing married Caucasian male father of 2 stepchildren, grandfather of 4 stepgrandchildren referred by Dr. Tanda Loring for cardiovascular evaluation because of newly recognized atrial fibrillation.    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  He was seen by Dr. Randine Bihari in consultation 09/13/2011 in the setting of A. fib with RVR during a hospitalization for hernia repair.  He is risk factors include treated hypertension and hyperlipidemia.  He is never had a heart attack or stroke.  He denies chest pain or shortness of breath.  There is no family history for heart disease.  He was recently seen by his PCP who noted that he was in A. fib rate controlled.  We will arrange for him to undergo outpatient cardioversion however he had missed a dose of his Xarelto  and this was postponed.  He was admitted to the hospital back in April for COPD exacerbation and had attempted cardioversion which was unsuccessful.  He was begun on amiodarone .  A 2D echocardiogram performed 11/03/2018 revealed an EF of 40 to 45% with a mildly dilated left atrium.  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  He complains of some swelling in his feet.   I arranged for him to undergo outpatient cardioversion by Dr. Loni however when he presented to the hospital he was in sinus rhythm and this was canceled.  He remains on Xarelto  oral anticoagulation.  Since I saw him in the office 3 years ago he was recently hospitalized on 09/03/2024 with community-acquired pneumonia and was treated.  He was in A-fib at the time.  Had not been taking his Xarelto  because of cost.  He feels clinically improved.   Active Medications[1]   Allergies[2]  Social History    Socioeconomic History   Marital status: Married    Spouse name: Not on file   Number of children: Not on file   Years of education: Not on file   Highest education level: Not on file  Occupational History   Not on file  Tobacco Use   Smoking status: Former    Current packs/day: 0.00    Types: Cigarettes    Start date: 09/18/1969    Quit date: 09/18/1970    Years since quitting: 54.1   Smokeless tobacco: Never  Substance and Sexual Activity   Alcohol use: No   Drug use: No   Sexual activity: Yes    Birth control/protection: None  Other Topics Concern   Not on file  Social History Narrative   Not on file   Social Drivers of Health   Tobacco Use: Medium Risk (10/26/2024)   Patient History    Smoking Tobacco Use: Former    Smokeless Tobacco Use: Never    Passive Exposure: Not on Actuary Strain: Not on file  Food Insecurity: No Food Insecurity (09/04/2024)   Epic    Worried About Programme Researcher, Broadcasting/film/video in the Last Year: Never true    Ran Out of Food in the Last Year: Never true  Transportation Needs: No Transportation Needs (09/04/2024)   Epic    Lack of Transportation (Medical): No    Lack of Transportation (Non-Medical): No  Physical  Activity: Not on file  Stress: Not on file  Social Connections: Unknown (09/04/2024)   Social Connection and Isolation Panel    Frequency of Communication with Friends and Family: Not on file    Frequency of Social Gatherings with Friends and Family: Not on file    Attends Religious Services: 1 to 4 times per year    Active Member of [employer] or Organizations: Not on file    Attends Banker Meetings: Not on file    Marital Status: Not on file  Intimate Partner Violence: Not At Risk (09/04/2024)   Epic    Fear of Current or Ex-Partner: No    Emotionally Abused: No    Physically Abused: No    Sexually Abused: No  Depression (PHQ2-9): Not on file  Alcohol Screen: Not on file  Housing: Low Risk (09/04/2024)    Epic    Unable to Pay for Housing in the Last Year: No    Number of Times Moved in the Last Year: 0    Homeless in the Last Year: No  Utilities: Not At Risk (09/04/2024)   Epic    Threatened with loss of utilities: No  Health Literacy: Not on file     Review of Systems: General: negative for chills, fever, night sweats or weight changes.  Cardiovascular: negative for chest pain, dyspnea on exertion, edema, orthopnea, palpitations, paroxysmal nocturnal dyspnea or shortness of breath Dermatological: negative for rash Respiratory: negative for cough or wheezing Urologic: negative for hematuria Abdominal: negative for nausea, vomiting, diarrhea, bright red blood per rectum, melena, or hematemesis Neurologic: negative for visual changes, syncope, or dizziness All other systems reviewed and are otherwise negative except as noted above.    Blood pressure (!) 156/78, pulse (!) 54, height 5' 10 (1.778 m), weight 206 lb 6.4 oz (93.6 kg), SpO2 94%.  General appearance: alert and no distress Neck: no adenopathy, no carotid bruit, no JVD, supple, symmetrical, trachea midline, and thyroid not enlarged, symmetric, no tenderness/mass/nodules Lungs: clear to auscultation bilaterally Heart: regular rate and rhythm, S1, S2 normal, no murmur, click, rub or gallop Extremities: extremities normal, atraumatic, no cyanosis or edema Pulses: 2+ and symmetric Skin: Skin color, texture, turgor normal. No rashes or lesions Neurologic: Grossly normal  EKG EKG Interpretation Date/Time:  Wednesday October 26 2024 15:09:35 EST Ventricular Rate:  62 PR Interval:    QRS Duration:  108 QT Interval:  418 QTC Calculation: 424 R Axis:   27  Text Interpretation: Atrial fibrillation Incomplete right bundle branch block ST & T wave abnormality, consider inferior ischemia When compared with ECG of 04-Sep-2024 23:38, No significant change was found Confirmed by Court Carrier [phone]) on 10/26/2024 3:14:41 PM     ASSESSMENT AND PLAN:   Hypertension History of essential hypertension blood pressure measured today at 156/78.  He is on diltiazem , and metoprolol .  Atrial fibrillation (HCC) History of PAF in the past never requiring cardioversion although on amiodarone  briefly.  He was just in the hospital with pneumonia and A-fib at that time.  On exam today he appears to be in sinus rhythm.  Unfortunately, he was not taking the Xarelto  because of cost issues.  Cardiomyopathy (HCC) History of cardiomyopathy in the past with echo 10/26/2018 revealing EF of 40 to 45% with mild to moderate aortic insufficiency.  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  I am going to recheck a 2D echocardiogram.     Carrier DOROTHA Court MD [facility] 10/26/2024 3:14 PM    [1]  Current Meds  Medication Sig   albuterol  (VENTOLIN  HFA) 108 (90 Base) MCG/ACT inhaler Inhale 2 puffs into the lungs every 4 (four) hours as needed for wheezing or shortness of breath.   Ascorbic Acid (VITAMIN C) 1000 MG tablet Take 1,000 mg by mouth in the morning.   aspirin  EC 81 MG tablet Take 81 mg by mouth daily. Once daily   citalopram  (CELEXA ) 40 MG tablet Take 20-40 mg by mouth daily. Take one-half tablet (20mg )- one whole tablet (40mg ) by mouth every morning.   cyanocobalamin 1000 MCG tablet Take 1,000 mcg by mouth in the morning.   diltiazem  (CARDIZEM  CD) 120 MG 24 hr capsule Take 120 mg by mouth daily.   ipratropium-albuterol  (DUONEB) 0.5-2.5 (3) MG/3ML SOLN Take 3 mLs by nebulization every 6 (six) hours as needed.   metoprolol  tartrate (LOPRESSOR ) 50 MG tablet Take 1 tablet (50 mg total) by mouth 2 (two) times daily. (Patient taking differently: Take 50 mg by mouth in the morning.)   Multiple Vitamins-Minerals (MULTIVITAMINS THER. W/MINERALS) TABS Take 1 tablet by mouth every evening.    rosuvastatin  (CRESTOR ) 10 MG tablet Take 10 mg by mouth daily.   traZODone  (DESYREL ) 150 MG tablet Take 150 mg by mouth at bedtime as needed for  sleep.   vitamin E 400 UNIT capsule Take 400 Units by mouth daily.   [2]  Allergies Allergen Reactions   Penicillins Anaphylaxis and Other (See Comments)    Seizures Did it involve swelling of the face/tongue/throat, SOB, or low BP? Yes Did it involve sudden or severe rash/hives, skin peeling, or any reaction on the inside of your mouth or nose? No Did you need to seek medical attention at a hospital or doctor's office? Yes When did it last happen?    teenager  If all above answers are NO, may proceed with cephalosporin use.    "

## 2024-10-26 NOTE — Assessment & Plan Note (Signed)
 History of PAF in the past never requiring cardioversion although on amiodarone  briefly.  He was just in the hospital with pneumonia and A-fib at that time.  On exam today he appears to be in sinus rhythm.  Unfortunately, he was not taking the Xarelto  because of cost issues.

## 2024-10-26 NOTE — Telephone Encounter (Signed)
 Please enroll patient in grant, cardiomyopathy diagnosis. Will pick up downstairs when approved

## 2024-10-26 NOTE — Assessment & Plan Note (Signed)
 History of cardiomyopathy in the past with echo 10/26/2018 revealing EF of 40 to 45% with mild to moderate aortic insufficiency.  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  I am going to recheck a 2D echocardiogram.

## 2024-10-31 ENCOUNTER — Other Ambulatory Visit: Payer: Self-pay

## 2024-10-31 ENCOUNTER — Other Ambulatory Visit (HOSPITAL_COMMUNITY): Payer: Self-pay

## 2024-10-31 MED ORDER — RIVAROXABAN 20 MG PO TABS
20.0000 mg | ORAL_TABLET | Freq: Every day | ORAL | 3 refills | Status: AC
  Filled 2024-10-31: qty 90, 90d supply, fill #0

## 2024-10-31 NOTE — Telephone Encounter (Signed)
 Left message on the patients phone

## 2024-11-01 ENCOUNTER — Ambulatory Visit (HOSPITAL_COMMUNITY)

## 2024-11-01 ENCOUNTER — Other Ambulatory Visit (HOSPITAL_COMMUNITY): Payer: Self-pay

## 2024-11-01 NOTE — Telephone Encounter (Signed)
 Pt returning call. Please advise.

## 2024-11-01 NOTE — Telephone Encounter (Signed)
 Left patient a message.

## 2024-11-02 ENCOUNTER — Other Ambulatory Visit (HOSPITAL_COMMUNITY): Payer: Self-pay

## 2024-11-02 NOTE — Telephone Encounter (Signed)
 Patient wife returning call

## 2024-11-02 NOTE — Telephone Encounter (Signed)
 I called the wife and she is aware to get his medication at [facility] pharmacy for free

## 2024-12-05 ENCOUNTER — Ambulatory Visit (HOSPITAL_COMMUNITY)

## 2025-01-24 ENCOUNTER — Ambulatory Visit: Admitting: Emergency Medicine
